# Patient Record
Sex: Female | Born: 1960 | Race: White | Hispanic: No | Marital: Married | Smoking: Never smoker
Health system: Southern US, Community
[De-identification: ages and names within clinical notes are randomized; demographics above are authoritative.]

## PROBLEM LIST (undated history)

## (undated) DIAGNOSIS — G4730 Sleep apnea, unspecified: Secondary | ICD-10-CM

## (undated) DIAGNOSIS — I10 Essential (primary) hypertension: Secondary | ICD-10-CM

## (undated) DIAGNOSIS — G43909 Migraine, unspecified, not intractable, without status migrainosus: Secondary | ICD-10-CM

## (undated) DIAGNOSIS — F419 Anxiety disorder, unspecified: Secondary | ICD-10-CM

## (undated) DIAGNOSIS — K219 Gastro-esophageal reflux disease without esophagitis: Secondary | ICD-10-CM

## (undated) DIAGNOSIS — C801 Malignant (primary) neoplasm, unspecified: Secondary | ICD-10-CM

## (undated) HISTORY — DX: Gastro-esophageal reflux disease without esophagitis: K21.9

## (undated) HISTORY — PX: TUBAL LIGATION: SHX77

## (undated) HISTORY — DX: Anxiety disorder, unspecified: F41.9

## (undated) HISTORY — PX: TONSILLECTOMY: SUR1361

## (undated) HISTORY — PX: OTHER SURGICAL HISTORY: SHX169

## (undated) HISTORY — DX: Sleep apnea, unspecified: G47.30

## (undated) HISTORY — DX: Essential (primary) hypertension: I10

---

## 2009-11-13 ENCOUNTER — Ambulatory Visit: Payer: Self-pay | Admitting: Family Medicine

## 2010-12-29 ENCOUNTER — Ambulatory Visit: Payer: Self-pay

## 2011-01-20 ENCOUNTER — Ambulatory Visit: Payer: Self-pay | Admitting: Family Medicine

## 2016-05-09 ENCOUNTER — Encounter: Payer: Self-pay | Admitting: Family Medicine

## 2016-05-09 ENCOUNTER — Ambulatory Visit (INDEPENDENT_AMBULATORY_CARE_PROVIDER_SITE_OTHER): Payer: BC Managed Care – PPO | Admitting: Family Medicine

## 2016-05-09 VITALS — BP 139/79 | HR 63 | Temp 98.1°F | Ht 65.0 in | Wt 208.0 lb

## 2016-05-09 DIAGNOSIS — B9789 Other viral agents as the cause of diseases classified elsewhere: Secondary | ICD-10-CM | POA: Diagnosis not present

## 2016-05-09 DIAGNOSIS — R232 Flushing: Secondary | ICD-10-CM | POA: Diagnosis not present

## 2016-05-09 DIAGNOSIS — J069 Acute upper respiratory infection, unspecified: Secondary | ICD-10-CM

## 2016-05-09 MED ORDER — ALBUTEROL SULFATE HFA 108 (90 BASE) MCG/ACT IN AERS: 2.0000 | INHALATION_SPRAY | Freq: Four times a day (QID) | RESPIRATORY_TRACT | 0 refills | Status: DC | PRN

## 2016-05-09 MED ORDER — BENZONATATE 100 MG PO CAPS: 200.0000 mg | ORAL_CAPSULE | Freq: Three times a day (TID) | ORAL | 0 refills | Status: DC | PRN

## 2016-05-09 MED ORDER — PAROXETINE MESYLATE 7.5 MG PO CAPS: 7.5000 mg | ORAL_CAPSULE | Freq: Every day | ORAL | 1 refills | Status: DC

## 2016-05-09 MED ORDER — HYDROCOD POLST-CPM POLST ER 10-8 MG/5ML PO SUER: 5.0000 mL | Freq: Two times a day (BID) | ORAL | 0 refills | Status: DC | PRN

## 2016-05-09 NOTE — Progress Notes (Signed)
BP 139/79 (BP Location: Right Arm, Patient Position: Sitting, Cuff Size: Large)   Pulse 63   Temp 98.1 F (36.7 C)   Ht 5\' 5"  (1.651 m) Comment: pt had shoes on  Wt 208 lb (94.3 kg) Comment: pt had shoes on  LMP 05/09/2014 (Approximate)   SpO2 97%   BMI 34.61 kg/m    Subjective:    Patient ID: Kendra Espinoza, female    DOB: [birth year], 55 y.o.   MRN: [id number]  HPI: Kendra Espinoza is a 55 y.o. female  Chief Complaint  Patient presents with  . URI    pt states she has had a fever, cough, achey, chest congestion, and left ear pain. States symptoms starter Saturday    Patient presents with 2 day history of cough, chest congestion and tightness, and left ear pain. Fever of 101 yesterday. Body aches, chills, sweats. Taking nyquil with some relief. Had a sick contact at work last week.   Also c/o hot flashes that have been significantly impacting her quality of life. Entire body gets flushed and she sweats profusely intermittently throughout the day. Keeps house freezing cold, husband and visitors constantly note how chilly she keeps it.   Relevant past medical, surgical, family and social history reviewed and updated as indicated. Interim medical history since our last visit reviewed. Allergies and medications reviewed and updated.  Review of Systems  Constitutional: Positive for chills, diaphoresis and fever.  HENT: Positive for congestion, ear pain and rhinorrhea.   Eyes: Negative.   Respiratory: Positive for cough and chest tightness.   Cardiovascular: Negative.   Gastrointestinal: Positive for diarrhea.  Genitourinary: Negative.   Musculoskeletal: Positive for myalgias.  Skin: Negative.   Neurological: Negative.   Psychiatric/Behavioral: Negative.     Per HPI unless specifically indicated above     Objective:    BP 139/79 (BP Location: Right Arm, Patient Position: Sitting, Cuff Size: Large)   Pulse 63   Temp 98.1 F (36.7 C)   Ht 5\' 5"  (1.651 m) Comment: pt had  shoes on  Wt 208 lb (94.3 kg) Comment: pt had shoes on  LMP 05/09/2014 (Approximate)   SpO2 97%   BMI 34.61 kg/m   Wt Readings from Last 3 Encounters:  05/09/16 208 lb (94.3 kg)    Physical Exam  Constitutional: She is oriented to person, place, and time. She appears well-developed and well-nourished. No distress.  HENT:  Head: Atraumatic.  Nose: Nose normal.  Mouth/Throat: Oropharynx is clear and moist. No oropharyngeal exudate.  B/l TMs with mild effusion. No erythema or purulent fluid  Eyes: Conjunctivae are normal. Pupils are equal, round, and reactive to light. No scleral icterus.  Neck: Normal range of motion. Neck supple.  Cardiovascular: Normal rate, regular rhythm and normal heart sounds.   Pulmonary/Chest: Effort normal and breath sounds normal. No respiratory distress. She has no wheezes.  Musculoskeletal: Normal range of motion. She exhibits no edema or tenderness.  Lymphadenopathy:    She has no cervical adenopathy.  Neurological: She is alert and oriented to person, place, and time.  Skin: Skin is warm and dry. No rash noted.  Psychiatric: She has a normal mood and affect. Her behavior is normal.  Nursing note and vitals reviewed.   No results found for this or any previous visit.    Assessment & Plan:   Problem List Items Addressed This Visit    None    Visit Diagnoses    Viral upper respiratory tract infection    -  Primary   Tussionex, tessalon perles, and albuterol sent for symptomatic treatment. Humidifier, mucinex, fluids. Follow up if no improvement later this week.    Hot flashes       Will do a trial of Brisdelle, side effects and precautions discussed. Patient understands and is agreeable to plan. Will follow up during physical        Follow up plan: Return if symptoms worsen or fail to improve, for Physical exam.

## 2016-05-11 ENCOUNTER — Other Ambulatory Visit: Payer: Self-pay | Admitting: Family Medicine

## 2016-05-11 ENCOUNTER — Encounter: Payer: Self-pay | Admitting: Family Medicine

## 2016-05-11 MED ORDER — AMOXICILLIN-POT CLAVULANATE 875-125 MG PO TABS: 1.0000 | ORAL_TABLET | Freq: Two times a day (BID) | ORAL | 0 refills | Status: DC

## 2016-05-27 ENCOUNTER — Other Ambulatory Visit: Payer: Self-pay | Admitting: Family Medicine

## 2016-05-27 ENCOUNTER — Encounter: Payer: Self-pay | Admitting: Family Medicine

## 2016-05-27 MED ORDER — AMOXICILLIN-POT CLAVULANATE 875-125 MG PO TABS: 1.0000 | ORAL_TABLET | Freq: Two times a day (BID) | ORAL | 0 refills | Status: DC

## 2016-07-22 ENCOUNTER — Encounter: Payer: BC Managed Care – PPO | Admitting: Family Medicine

## 2016-08-15 ENCOUNTER — Ambulatory Visit
Admission: EM | Admit: 2016-08-15 | Discharge: 2016-08-15 | Disposition: A | Discharge status: Home / Self Care | Payer: BC Managed Care – PPO | Attending: Family Medicine | Admitting: Family Medicine

## 2016-08-15 DIAGNOSIS — J069 Acute upper respiratory infection, unspecified: Secondary | ICD-10-CM

## 2016-08-15 MED ORDER — HYDROCOD POLST-CPM POLST ER 10-8 MG/5ML PO SUER: 5.0000 mL | Freq: Two times a day (BID) | ORAL | 0 refills | Status: DC

## 2016-08-15 MED ORDER — BENZONATATE 200 MG PO CAPS: 200.0000 mg | ORAL_CAPSULE | Freq: Three times a day (TID) | ORAL | 0 refills | Status: DC

## 2016-08-15 MED ORDER — FLUTICASONE PROPIONATE 50 MCG/ACT NA SUSP: 2.0000 | Freq: Every day | NASAL | 0 refills | Status: DC

## 2016-08-15 NOTE — ED Triage Notes (Addendum)
Pt c/o cough, body aches, stuffy nose, sore all over and sore throat. Symptoms started saturday

## 2016-08-15 NOTE — ED Provider Notes (Signed)
CSN: [id number]     Arrival date & time 08/15/16  [id number] History   First MD Initiated Contact with Patient 08/15/16 1235     Chief Complaint  Patient presents with  . Cough   (Consider location/radiation/quality/duration/timing/severity/associated sxs/prior Treatment) HPI  This 56 year old female who presents with cough body aches stuffy nose that started 2 days ago. He works as a Secretary/administrator at [employer]. Her cough is nonproductive. Is been planing of nasal congestion has been using a vaporizer at nighttime. He did not receive a flu shot  this year. No fever or chills.       History reviewed. No pertinent past medical history. Past Surgical History:  Procedure Laterality Date  . CESAREAN SECTION    . skin cancer removal    . TONSILLECTOMY     Family History  Problem Relation Age of Onset  . Heart disease Mother   . Hypertension Mother   . Stroke Mother   . Heart attack Father 71  . Heart disease Father   . Hypertension Father   . Drug abuse Sister   . Hodgkin's lymphoma Brother   . Heart attack Maternal Grandmother   . Heart attack Maternal Grandfather   . Heart attack Paternal Grandfather    Social History  Substance Use Topics  . Smoking status: Never Smoker  . Smokeless tobacco: Never Used  . Alcohol use No   OB History    No data available     Review of Systems  Constitutional: Positive for activity change and fatigue. Negative for chills and fever.  HENT: Positive for congestion, postnasal drip, rhinorrhea and sinus pressure.   Respiratory: Positive for cough. Negative for shortness of breath, wheezing and stridor.   All other systems reviewed and are negative.   Allergies  Patient has no known allergies.  Home Medications   Prior to Admission medications   Medication Sig Start Date End Date Taking? Authorizing Provider  albuterol (PROVENTIL HFA;VENTOLIN HFA) 108 (90 Base) MCG/ACT inhaler Inhale 2 puffs into the lungs every 6 (six) hours as needed  for wheezing or shortness of breath. 05/09/16  Yes Volney American, PA-C  benzonatate (TESSALON) 200 MG capsule Take 1 capsule (200 mg total) by mouth every 8 (eight) hours. 08/15/16   Lorin Picket, PA-C  chlorpheniramine-HYDROcodone (TUSSIONEX PENNKINETIC ER) 10-8 MG/5ML SUER Take 5 mLs by mouth 2 (two) times daily. 08/15/16   Lorin Picket, PA-C  fluticasone (FLONASE) 50 MCG/ACT nasal spray Place 2 sprays into both nostrils daily. 08/15/16   Lorin Picket, PA-C   Meds Ordered and Administered this Visit  Medications - No data to display  BP (!) 163/97 (BP Location: Left Arm)   Pulse 68   Temp 98.9 F (37.2 C) (Oral)   Resp 18   Ht 5\' 3"  (1.6 m)   Wt 185 lb (83.9 kg)   LMP 05/09/2014 (Approximate)   SpO2 99%   BMI 32.77 kg/m  No data found.   Physical Exam  Constitutional: She is oriented to person, place, and time. She appears well-developed and well-nourished. No distress.  HENT:  Head: Normocephalic and atraumatic.  Right Ear: External ear normal.  Left Ear: External ear normal.  Nose: Nose normal.  Mouth/Throat: Oropharynx is clear and moist. No oropharyngeal exudate.  Eyes: EOM are normal. Pupils are equal, round, and reactive to light. Right eye exhibits no discharge. Left eye exhibits no discharge.  Neck: Normal range of motion. Neck supple.  Pulmonary/Chest: Effort normal and breath  sounds normal. No respiratory distress. She has no wheezes. She has no rales.  Musculoskeletal: Normal range of motion.  Lymphadenopathy:    She has no cervical adenopathy.  Neurological: She is alert and oriented to person, place, and time.  Skin: Skin is warm and dry. She is not diaphoretic.  Psychiatric: She has a normal mood and affect. Her behavior is normal. Judgment and thought content normal.  Nursing note and vitals reviewed.   Urgent Care Course     Procedures (including critical care time)  Labs Review Labs Reviewed - No data to display  Imaging Review No  results found.   Visual Acuity Review  Right Eye Distance:   Left Eye Distance:   Bilateral Distance:    Right Eye Near:   Left Eye Near:    Bilateral Near:         MDM   1. Acute upper respiratory infection    Discharge Medication List as of 08/15/2016 12:46 PM    START taking these medications   Details  benzonatate (TESSALON) 200 MG capsule Take 1 capsule (200 mg total) by mouth every 8 (eight) hours., Starting Mon 08/15/2016, Normal    chlorpheniramine-HYDROcodone (TUSSIONEX PENNKINETIC ER) 10-8 MG/5ML SUER Take 5 mLs by mouth 2 (two) times daily., Starting Mon 08/15/2016, Print    fluticasone (FLONASE) 50 MCG/ACT nasal spray Place 2 sprays into both nostrils daily., Starting Mon 08/15/2016, Normal      Plan: 1. Test/x-ray results and diagnosis reviewed with patient 2. rx as per orders; risks, benefits, potential side effects reviewed with patient 3. Recommend supportive treatment with Rest and fluids.Use Flonase for nasal congestion for 3-4 weeks. If she is not improving should follow-up with her primary care physician 4. F/u prn if symptoms worsen or don't improve     Lorin Picket, PA-C 08/15/16 1251

## 2016-08-16 ENCOUNTER — Telehealth: Payer: Self-pay

## 2017-08-23 ENCOUNTER — Ambulatory Visit: Payer: BC Managed Care – PPO | Admitting: Family Medicine

## 2017-08-23 ENCOUNTER — Encounter: Payer: Self-pay | Admitting: Family Medicine

## 2017-08-23 VITALS — BP 117/82 | HR 77 | Temp 98.0°F | Wt 194.6 lb

## 2017-08-23 DIAGNOSIS — J0110 Acute frontal sinusitis, unspecified: Secondary | ICD-10-CM

## 2017-08-23 MED ORDER — AMOXICILLIN-POT CLAVULANATE 875-125 MG PO TABS: 1.0000 | ORAL_TABLET | Freq: Two times a day (BID) | ORAL | 0 refills | Status: DC

## 2017-08-23 MED ORDER — BENZONATATE 200 MG PO CAPS: 200.0000 mg | ORAL_CAPSULE | Freq: Three times a day (TID) | ORAL | 0 refills | Status: DC | PRN

## 2017-08-23 MED ORDER — HYDROCOD POLST-CPM POLST ER 10-8 MG/5ML PO SUER: 5.0000 mL | Freq: Two times a day (BID) | ORAL | 0 refills | Status: DC

## 2017-08-23 NOTE — Progress Notes (Signed)
   BP 117/82 (BP Location: Left Arm, Patient Position: Bed low/side rails up, Cuff Size: Normal)   Pulse 77   Temp 98 F (36.7 C) (Oral)   Wt 194 lb 9.6 oz (88.3 kg)   LMP 05/09/2014 (Approximate)   SpO2 99%   BMI 34.47 kg/m    Subjective:    Patient ID: Kendra Espinoza, female    DOB: December 17, 1960, 57 y.o.   MRN: [id number]  HPI: Kendra Espinoza is a 57 y.o. female  Chief Complaint  Patient presents with  . Cough    Patient has been taking OTC medications with no releif. Ongoing for 1 week.  . Shortness of Breath  . Nasal Congestion  . Epistaxis  . Nausea  . Diarrhea   Facial pain and pressure, fatigue, congestion, thick bloody drainage, body aches, low grade fevers, diarrhea x 1 week. Denies CP,SOB, vomiting. Taking nyquil, sudafed, cold and cough medications. Lots of sick contacts, works at [employer].   Relevant past medical, surgical, family and social history reviewed and updated as indicated. Interim medical history since our last visit reviewed. Allergies and medications reviewed and updated.  Review of Systems  Per HPI unless specifically indicated above     Objective:    BP 117/82 (BP Location: Left Arm, Patient Position: Bed low/side rails up, Cuff Size: Normal)   Pulse 77   Temp 98 F (36.7 C) (Oral)   Wt 194 lb 9.6 oz (88.3 kg)   LMP 05/09/2014 (Approximate)   SpO2 99%   BMI 34.47 kg/m   Wt Readings from Last 3 Encounters:  08/23/17 194 lb 9.6 oz (88.3 kg)  08/15/16 185 lb (83.9 kg)  05/09/16 208 lb (94.3 kg)    Physical Exam  Constitutional: She is oriented to person, place, and time. She appears well-developed and well-nourished. No distress.  HENT:  Head: Atraumatic.  Right Ear: External ear normal.  Left Ear: External ear normal.  Nasal mucosa and oropharynx erythematous and mildly edematous, drainage present B/l sinus ttp  Eyes: Conjunctivae are normal. Pupils are equal, round, and reactive to light. No scleral icterus.  Neck: Normal range  of motion. Neck supple.  Cardiovascular: Normal rate and normal heart sounds.  Pulmonary/Chest: Effort normal and breath sounds normal. No respiratory distress.  Musculoskeletal: Normal range of motion.  Neurological: She is alert and oriented to person, place, and time.  Skin: Skin is warm and dry.  Psychiatric: She has a normal mood and affect. Her behavior is normal.  Nursing note and vitals reviewed.  No results found for this or any previous visit.    Assessment & Plan:   Problem List Items Addressed This Visit    None    Visit Diagnoses    Acute frontal sinusitis, recurrence not specified    -  Primary   Will tx with augmentin, tessalon, and tussionex. Continue OTC mucinex, sinus rinses, etc. Sedation precautions reviewed, pt agreeable. F/u if no better   Relevant Medications   amoxicillin-clavulanate (AUGMENTIN) 875-125 MG tablet   chlorpheniramine-HYDROcodone (TUSSIONEX PENNKINETIC ER) 10-8 MG/5ML SUER   benzonatate (TESSALON) 200 MG capsule       Follow up plan: Return if symptoms worsen or fail to improve.

## 2017-08-23 NOTE — Patient Instructions (Signed)
Follow up as needed

## 2017-08-25 ENCOUNTER — Other Ambulatory Visit: Payer: Self-pay | Admitting: Family Medicine

## 2017-08-25 ENCOUNTER — Encounter: Payer: Self-pay | Admitting: Family Medicine

## 2017-09-13 ENCOUNTER — Encounter: Payer: Self-pay | Admitting: Family Medicine

## 2017-09-13 ENCOUNTER — Ambulatory Visit: Payer: BC Managed Care – PPO | Admitting: Family Medicine

## 2017-09-13 VITALS — BP 120/88 | HR 68 | Temp 98.6°F | Wt 195.6 lb

## 2017-09-13 DIAGNOSIS — G8929 Other chronic pain: Secondary | ICD-10-CM

## 2017-09-13 DIAGNOSIS — M79674 Pain in right toe(s): Secondary | ICD-10-CM | POA: Diagnosis not present

## 2017-09-13 DIAGNOSIS — D229 Melanocytic nevi, unspecified: Secondary | ICD-10-CM | POA: Diagnosis not present

## 2017-09-13 MED ORDER — GABAPENTIN 300 MG PO CAPS: 300.0000 mg | ORAL_CAPSULE | Freq: Three times a day (TID) | ORAL | 1 refills | Status: DC

## 2017-09-13 NOTE — Progress Notes (Signed)
BP 120/88 (BP Location: Left Arm, Patient Position: Sitting, Cuff Size: Normal)   Pulse 68   Temp 98.6 F (37 C) (Oral)   Wt 195 lb 9.6 oz (88.7 kg)   LMP 05/09/2014 (Approximate)   SpO2 99%   BMI 34.65 kg/m    Subjective:    Patient ID: Kendra Espinoza, female    DOB: Oct 06, 1960, 57 y.o.   MRN: [id number]  HPI: MICHAELLA Espinoza is a 57 y.o. female  Chief Complaint  Patient presents with  . Foot Pain    Broke toe back last May. Was working and stumbled and hurt foot. Foot has gave out and she has fell. Throbbing. Pain. Swelling. Right foot.   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 States she broke the toe last May and was out of work for several months. Has had persistent pain, redness, and swelling since that incident. Trying OTC pain relievers, soaks, elevation with minimal relief. Denies numbness, fevers, streaking.   Also has a raised mole on her chin that she would like removed for cosmetic reasons  No past medical history on file.  Social History   Socioeconomic History  . Marital status: Married    Spouse name: Not on file  . Number of children: Not on file  . Years of education: Not on file  . Highest education level: Not on file  Social Needs  . Financial resource strain: Not on file  . Food insecurity - worry: Not on file  . Food insecurity - inability: Not on file  . Transportation needs - medical: Not on file  . Transportation needs - non-medical: Not on file  Occupational History  . Not on file  Tobacco Use  . Smoking status: Never Smoker  . Smokeless tobacco: Never Used  Substance and Sexual Activity  . Alcohol use: No  . Drug use: No  . Sexual activity: Yes  Other Topics Concern  . Not on file  Social History Narrative  . Not on file   Relevant past medical, surgical, family and social history reviewed and updated as indicated. Interim medical  history since our last visit reviewed. Allergies and medications reviewed and updated.  Review of Systems  Per HPI unless specifically indicated above     Objective:    BP 120/88 (BP Location: Left Arm, Patient Position: Sitting, Cuff Size: Normal)   Pulse 68   Temp 98.6 F (37 C) (Oral)   Wt 195 lb 9.6 oz (88.7 kg)   LMP 05/09/2014 (Approximate)   SpO2 99%   BMI 34.65 kg/m   Wt Readings from Last 3 Encounters:  09/13/17 195 lb 9.6 oz (88.7 kg)  08/23/17 194 lb 9.6 oz (88.3 kg)  08/15/16 185 lb (83.9 kg)    Physical Exam  Constitutional: She is oriented to person, place, and time. She appears well-developed and well-nourished. No distress.  HENT:  Head: Atraumatic.  Eyes: Conjunctivae are normal. No scleral icterus.  Neck: Normal range of motion. Neck supple.  Cardiovascular: Normal rate and normal heart sounds.  Pulmonary/Chest: Effort normal. No respiratory distress.  Musculoskeletal:  ROM in 4th and 5th toes right foot minimal, which has been baseline since crush injury and fx last year 5th toe erythematous, mildly edematous, ttp  Neurological: She is alert and oriented to person, place, and time.  Skin: Skin is warm and dry.  flesh colored  raised nevus on chin  Psychiatric: She has a normal mood and affect. Her behavior is normal.  Nursing note and vitals reviewed.  No results found for this or any previous visit.    Assessment & Plan:   Problem List Items Addressed This Visit    None    Visit Diagnoses    Chronic toe pain, right foot    -  Primary   RICE protocol, start gabapentin, titrate up from 1-3 tabs daily for her chronic nerve pain following crush fracture. Will obtain additional x-ray if no relief   Nevus       Will refer to Dermatology for removal. Reassured pt that it's benign and removal would be for cosmesis   Relevant Medications   triamcinolone acetonide (KENALOG-40) injection 40 mg (Completed)   Other Relevant Orders   Ambulatory referral to  Dermatology       Follow up plan: Return for CPE after 4/24.

## 2017-09-14 MED ORDER — TRIAMCINOLONE ACETONIDE 40 MG/ML IJ SUSP
40.0000 mg | Freq: Once | INTRAMUSCULAR | Status: AC
  Administered 2017-09-13: 40 mg via INTRAMUSCULAR

## 2017-09-15 ENCOUNTER — Encounter: Payer: Self-pay | Admitting: Family Medicine

## 2017-09-16 NOTE — Patient Instructions (Signed)
Follow up for CPE 

## 2017-09-20 ENCOUNTER — Other Ambulatory Visit: Payer: Self-pay | Admitting: Family Medicine

## 2017-09-20 MED ORDER — PREDNISONE 20 MG PO TABS: 40.0000 mg | ORAL_TABLET | Freq: Every day | ORAL | 0 refills | Status: DC

## 2017-09-22 ENCOUNTER — Telehealth: Payer: Self-pay | Admitting: Family Medicine

## 2017-09-22 NOTE — Telephone Encounter (Signed)
Pt returned call. Please try her again on [phone].

## 2017-09-22 NOTE — Telephone Encounter (Signed)
Called pt yesterday around 4:50 pm to discuss FMLA form specifics, left message to return call. Please let me know if she calls back. I will try again later today

## 2017-09-22 NOTE — Telephone Encounter (Signed)
Spoke with patient regarding her forms, she is aware they will be ready for her to come pick up

## 2017-11-10 ENCOUNTER — Encounter: Payer: BC Managed Care – PPO | Admitting: Family Medicine

## 2018-01-15 ENCOUNTER — Ambulatory Visit: Payer: BC Managed Care – PPO | Admitting: Family Medicine

## 2018-01-15 ENCOUNTER — Encounter: Payer: Self-pay | Admitting: Family Medicine

## 2018-01-15 ENCOUNTER — Other Ambulatory Visit: Payer: Self-pay

## 2018-01-15 VITALS — BP 135/88 | HR 61 | Temp 98.2°F | Ht 63.0 in | Wt 196.3 lb

## 2018-01-15 DIAGNOSIS — S46912A Strain of unspecified muscle, fascia and tendon at shoulder and upper arm level, left arm, initial encounter: Secondary | ICD-10-CM | POA: Diagnosis not present

## 2018-01-15 MED ORDER — MELOXICAM 15 MG PO TABS: 15.0000 mg | ORAL_TABLET | Freq: Every day | ORAL | 0 refills | Status: DC

## 2018-01-15 NOTE — Progress Notes (Signed)
   BP 135/88 (BP Location: Right Arm, Patient Position: Sitting, Cuff Size: Normal)   Pulse 61   Temp 98.2 F (36.8 C) (Oral)   Ht 5\' 3"  (1.6 m)   Wt 196 lb 4.8 oz (89 kg)   LMP 05/09/2014 (Approximate)   SpO2 97%   BMI 34.77 kg/m    Subjective:    Patient ID: Kendra Espinoza, female    DOB: Aug 24, 1960, 57 y.o.   MRN: [id number]  HPI: Kendra Espinoza is a 57 y.o. female  Chief Complaint  Patient presents with  . Arm Pain    Upper Left Arm. Patient lifted something at work. Ongoing for 6 days.   6 day hx of left upper arm pain, seems to have started after lifting a very heavy trash bag at work the day before. Pain is dull and constant. No numbness, tingling, burning, radiation down arm. Significantly limited ROM in the shoulder currently. Has tried ice packs and rest with no relief. Has not tried OTC medications.   Relevant past medical, surgical, family and social history reviewed and updated as indicated. Interim medical history since our last visit reviewed. Allergies and medications reviewed and updated.  Review of Systems  Per HPI unless specifically indicated above     Objective:    BP 135/88 (BP Location: Right Arm, Patient Position: Sitting, Cuff Size: Normal)   Pulse 61   Temp 98.2 F (36.8 C) (Oral)   Ht 5\' 3"  (1.6 m)   Wt 196 lb 4.8 oz (89 kg)   LMP 05/09/2014 (Approximate)   SpO2 97%   BMI 34.77 kg/m   Wt Readings from Last 3 Encounters:  01/15/18 196 lb 4.8 oz (89 kg)  09/13/17 195 lb 9.6 oz (88.7 kg)  08/23/17 194 lb 9.6 oz (88.3 kg)    Physical Exam  Constitutional: She is oriented to person, place, and time. She appears well-developed and well-nourished. No distress.  HENT:  Head: Atraumatic.  Eyes: Pupils are equal, round, and reactive to light.  Neck: Normal range of motion. Neck supple.  Cardiovascular: Normal rate and regular rhythm.  Pulmonary/Chest: Effort normal and breath sounds normal.  Musculoskeletal: She exhibits tenderness (ttp left  anterior shoulder and deltoid). She exhibits no edema or deformity.  ROM exam limited by pt discomfort. Active ROM attempted to about 30 degrees Grip strength full and equal b/l  Neurological: She is alert and oriented to person, place, and time.  B/l UEs neurovascularly intact  Skin: Skin is warm and dry. No erythema.  Psychiatric: She has a normal mood and affect. Her behavior is normal.  Nursing note and vitals reviewed.  No results found for this or any previous visit.    Assessment & Plan:   Problem List Items Addressed This Visit    None    Visit Diagnoses    Shoulder strain, left, initial encounter    -  Primary   Tx with meloxicam, tylenol, epsom salt soaks, gentle stretches. Work note given and advised to avoid any heavy lifting for several days until improved       Follow up plan: Return for as scheduled.

## 2018-01-16 NOTE — Patient Instructions (Signed)
Follow up as scheduled.  

## 2018-03-27 ENCOUNTER — Encounter: Payer: Self-pay | Admitting: Family Medicine

## 2018-03-27 ENCOUNTER — Other Ambulatory Visit: Payer: Self-pay | Admitting: Family Medicine

## 2018-03-27 ENCOUNTER — Other Ambulatory Visit: Payer: Self-pay

## 2018-03-27 ENCOUNTER — Ambulatory Visit: Payer: BC Managed Care – PPO | Admitting: Family Medicine

## 2018-03-27 VITALS — BP 123/81 | HR 68 | Temp 97.8°F | Ht 63.0 in | Wt 198.5 lb

## 2018-03-27 DIAGNOSIS — F4321 Adjustment disorder with depressed mood: Secondary | ICD-10-CM

## 2018-03-27 DIAGNOSIS — Z23 Encounter for immunization: Secondary | ICD-10-CM

## 2018-03-27 DIAGNOSIS — R519 Headache, unspecified: Secondary | ICD-10-CM

## 2018-03-27 DIAGNOSIS — R51 Headache: Secondary | ICD-10-CM | POA: Diagnosis not present

## 2018-03-27 MED ORDER — KETOROLAC TROMETHAMINE 60 MG/2ML IM SOLN
60.0000 mg | Freq: Once | INTRAMUSCULAR | Status: AC
  Administered 2018-03-27: 60 mg via INTRAMUSCULAR

## 2018-03-27 MED ORDER — DULOXETINE HCL 20 MG PO CPEP: 20.0000 mg | ORAL_CAPSULE | Freq: Every day | ORAL | 3 refills | Status: DC

## 2018-03-27 NOTE — Progress Notes (Signed)
BP 123/81   Pulse 68   Temp 97.8 F (36.6 C) (Oral)   Ht 5\' 3"  (1.6 m)   Wt 198 lb 8 oz (90 kg)   SpO2 98%   BMI 35.16 kg/m    Subjective:    Patient ID: Kendra Espinoza, female    DOB: 08-28-1960, 57 y.o.   MRN: [id number]  HPI: Kendra Espinoza is a 56 y.o. female  Chief Complaint  Patient presents with  . Headache    x 5 days pt states she has been taking a Rx Topiramate, states that seems like it's no longer helping as she has been through a lot lately, dad passed away recently   Here today to discuss 5 days of nagging headaches that she feels are largely related to grief as she recently lost her father suddenly. Already taking topamax regularly as well as OTC pain relievers. Crying often, not eating or drinking well. Not seeing a grief counselor. Denies visual changes, N/V, photophobia, phonophobia. No PMH of depression.   Depression screen PHQ 2/9 03/27/2018  Decreased Interest 2  Down, Depressed, Hopeless 2  PHQ - 2 Score 4  Altered sleeping 3  Tired, decreased energy 3  Change in appetite 3  Feeling bad or failure about yourself  1  Trouble concentrating 2  Moving slowly or fidgety/restless 1  Suicidal thoughts 0  PHQ-9 Score 17    Relevant past medical, surgical, family and social history reviewed and updated as indicated. Interim medical history since our last visit reviewed. Allergies and medications reviewed and updated.  Review of Systems  Per HPI unless specifically indicated above     Objective:    BP 123/81   Pulse 68   Temp 97.8 F (36.6 C) (Oral)   Ht 5\' 3"  (1.6 m)   Wt 198 lb 8 oz (90 kg)   SpO2 98%   BMI 35.16 kg/m   Wt Readings from Last 3 Encounters:  03/27/18 198 lb 8 oz (90 kg)  01/15/18 196 lb 4.8 oz (89 kg)  09/13/17 195 lb 9.6 oz (88.7 kg)    Physical Exam  Constitutional: She is oriented to person, place, and time. She appears well-developed and well-nourished. No distress.  HENT:  Head: Atraumatic.  Eyes: Conjunctivae and EOM  are normal.  Neck: Normal range of motion. Neck supple.  Cardiovascular: Normal rate and regular rhythm.  Musculoskeletal: Normal range of motion.  Neurological: She is alert and oriented to person, place, and time.  Skin: Skin is warm and dry.  Psychiatric: She has a normal mood and affect. Her behavior is normal.  Nursing note and vitals reviewed.   No results found for this or any previous visit.    Assessment & Plan:   Problem List Items Addressed This Visit    None    Visit Diagnoses    Nonintractable episodic headache, unspecified headache type    -  Primary   IM toradol given today. Will start cymablta to help with her adjustment depression as well as her headaches. Push fluids, stretch, rest    Relevant Medications   DULoxetine (CYMBALTA) 20 MG capsule   ketorolac (TORADOL) injection 60 mg (Completed)   Grief       Packet of counselors given, urged pt to follow up on grief counseling. Will start cymablta for adjustment depression   Flu vaccine need       Relevant Orders   Flu Vaccine QUAD 36+ mos IM (Completed)  Follow up plan: Return in about 4 weeks (around 04/24/2018) for Mood, headache f/u.

## 2018-03-28 ENCOUNTER — Other Ambulatory Visit: Payer: Self-pay | Admitting: Family Medicine

## 2018-03-28 ENCOUNTER — Encounter: Payer: Self-pay | Admitting: Family Medicine

## 2018-03-28 NOTE — Patient Instructions (Signed)
Follow up in 1 month   

## 2018-04-20 ENCOUNTER — Encounter: Payer: Self-pay | Admitting: Family Medicine

## 2018-04-20 ENCOUNTER — Other Ambulatory Visit: Payer: Self-pay

## 2018-04-20 ENCOUNTER — Ambulatory Visit: Payer: BC Managed Care – PPO | Admitting: Family Medicine

## 2018-04-20 VITALS — BP 143/89 | HR 80 | Temp 98.5°F | Ht 63.0 in | Wt 198.0 lb

## 2018-04-20 DIAGNOSIS — B9789 Other viral agents as the cause of diseases classified elsewhere: Secondary | ICD-10-CM

## 2018-04-20 DIAGNOSIS — J069 Acute upper respiratory infection, unspecified: Secondary | ICD-10-CM | POA: Diagnosis not present

## 2018-04-20 LAB — VERITOR FLU A/B WAIVED
Influenza A: NEGATIVE
Influenza B: NEGATIVE

## 2018-04-20 MED ORDER — BENZONATATE 200 MG PO CAPS: 200.0000 mg | ORAL_CAPSULE | Freq: Three times a day (TID) | ORAL | 0 refills | Status: DC | PRN

## 2018-04-20 MED ORDER — HYDROCOD POLST-CPM POLST ER 10-8 MG/5ML PO SUER: 5.0000 mL | Freq: Two times a day (BID) | ORAL | 0 refills | Status: DC | PRN

## 2018-04-20 NOTE — Progress Notes (Signed)
   BP (!) 143/89   Pulse 80   Temp 98.5 F (36.9 C) (Oral)   Ht 5\' 3"  (1.6 m)   Wt 198 lb (89.8 kg)   SpO2 98%   BMI 35.07 kg/m    Subjective:    Patient ID: Kendra Espinoza, female    DOB: May 30, 1961, 57 y.o.   MRN: [id number]  HPI: Kendra Espinoza is a 57 y.o. female  Chief Complaint  Patient presents with  . Cough    pt states she has had fever two days ago/ states she feels very warm and heartburn   3 days of fatigue, fevers, aches, hot flashes, sweats, cough, sore throat, congestion. Fever broke after first day but still feeling very run down. Cough is most bothersome right now. No CP, SOB, HAs, N/V/D. Not trying anything OTC currently. No sick contacts, smoking hx.   Relevant past medical, surgical, family and social history reviewed and updated as indicated. Interim medical history since our last visit reviewed. Allergies and medications reviewed and updated.  Review of Systems  Per HPI unless specifically indicated above     Objective:    BP (!) 143/89   Pulse 80   Temp 98.5 F (36.9 C) (Oral)   Ht 5\' 3"  (1.6 m)   Wt 198 lb (89.8 kg)   SpO2 98%   BMI 35.07 kg/m   Wt Readings from Last 3 Encounters:  04/20/18 198 lb (89.8 kg)  03/27/18 198 lb 8 oz (90 kg)  01/15/18 196 lb 4.8 oz (89 kg)    Physical Exam  Constitutional: She is oriented to person, place, and time. She appears well-developed and well-nourished. No distress.  HENT:  Head: Atraumatic.  Right Ear: External ear normal.  Left Ear: External ear normal.  Mouth/Throat: No oropharyngeal exudate.  Oropharynx and nasal mucosa erythematous and edematous  Eyes: Conjunctivae and EOM are normal.  Neck: Normal range of motion. Neck supple.  Cardiovascular: Normal rate and regular rhythm.  Pulmonary/Chest: Effort normal and breath sounds normal. She has no wheezes. She has no rales.  Musculoskeletal: Normal range of motion.  Neurological: She is alert and oriented to person, place, and time.  Skin: Skin  is warm and dry.  Psychiatric: She has a normal mood and affect. Her behavior is normal.  Nursing note and vitals reviewed.   No results found for this or any previous visit.    Assessment & Plan:   Problem List Items Addressed This Visit    None    Visit Diagnoses    Viral URI with cough    -  Primary   Rapid flu neg, tx symptomatically with tessalon, tussionex, mucinex, flonase, rest, fluids. F/u if not improving. Sedation precautions reviewed   Relevant Orders   Veritor Flu A/B Waived       Follow up plan: Return for as scheduled.

## 2018-04-20 NOTE — Patient Instructions (Signed)
Follow up as scheduled.  

## 2018-04-27 ENCOUNTER — Ambulatory Visit: Payer: BC Managed Care – PPO | Admitting: Family Medicine

## 2018-05-10 ENCOUNTER — Encounter: Payer: Self-pay | Admitting: Family Medicine

## 2018-05-11 MED ORDER — TOPIRAMATE 50 MG PO TABS: 50.0000 mg | ORAL_TABLET | Freq: Every day | ORAL | 3 refills | Status: DC

## 2018-06-08 ENCOUNTER — Ambulatory Visit
Admission: EM | Admit: 2018-06-08 | Discharge: 2018-06-08 | Disposition: A | Discharge status: Home / Self Care | Payer: BC Managed Care – PPO | Attending: Family Medicine | Admitting: Family Medicine

## 2018-06-08 ENCOUNTER — Other Ambulatory Visit: Payer: Self-pay

## 2018-06-08 DIAGNOSIS — R0981 Nasal congestion: Secondary | ICD-10-CM

## 2018-06-08 DIAGNOSIS — J111 Influenza due to unidentified influenza virus with other respiratory manifestations: Secondary | ICD-10-CM

## 2018-06-08 DIAGNOSIS — R69 Illness, unspecified: Secondary | ICD-10-CM | POA: Diagnosis not present

## 2018-06-08 LAB — RAPID STREP SCREEN (MED CTR MEBANE ONLY): Streptococcus, Group A Screen (Direct): NEGATIVE

## 2018-06-08 MED ORDER — OSELTAMIVIR PHOSPHATE 75 MG PO CAPS: 75.0000 mg | ORAL_CAPSULE | Freq: Two times a day (BID) | ORAL | 0 refills | Status: DC

## 2018-06-08 NOTE — ED Provider Notes (Signed)
[facility] ____________________________________________  Time seen: Approximately 9:45 AM  I have reviewed the triage vital signs and the nursing notes.   HISTORY  Chief Complaint Generalized Body Aches   HPI Kendra Espinoza is a 57 y.o. female presenting for evaluation of runny nose, nasal congestion, sneezing, cough, chills and body aches with quick onset yesterday evening.  Reports did have accompanying fever with T-max 101.  Did take NyQuil in the middle the night and had Tylenol in it.  No other of the counter medications taken prior to arrival.  States having generalized diffuse chills and body aches.  Some sore throat.  Expressed positive contact with multiple sick exposures including her son with cold-like symptoms, her son's child and girlfriend with strep.  Also reports works at [employer] with multiple sick contacts.  Continues to overall drink fluids well, decreased appetite.  Denies other aggravating alleviating factors.  No chest pain or shortness of breath.  Denies recent sickness.  Volney American, PA-C: PCP   History reviewed. No pertinent past medical history.  There are no active problems to display for this patient.   Past Surgical History:  Procedure Laterality Date  . CESAREAN SECTION    . skin cancer removal    . TONSILLECTOMY       No current facility-administered medications for this encounter.   Current Outpatient Medications:  .  topiramate (TOPAMAX) 50 MG tablet, Take 1 tablet (50 mg total) by mouth daily., Disp: 30 tablet, Rfl: 3 .  oseltamivir (TAMIFLU) 75 MG capsule, Take 1 capsule (75 mg total) by mouth every 12 (twelve) hours., Disp: 10 capsule, Rfl: 0  Allergies Patient has no known allergies.  Family History  Problem Relation Age of Onset  . Heart disease Mother   . Hypertension Mother   . Stroke Mother   . Heart attack Father 62  . Heart disease Father   . Hypertension Father   . Drug abuse Sister   .  Hodgkin's lymphoma Brother   . Heart attack Maternal Grandmother   . Heart attack Maternal Grandfather   . Heart attack Paternal Grandfather     Social History Social History   Tobacco Use  . Smoking status: Never Smoker  . Smokeless tobacco: Never Used  Substance Use Topics  . Alcohol use: Yes    Comment: occasionally  . Drug use: No    Review of Systems Constitutional: positive fever. ENT:as above.  Cardiovascular: Denies chest pain. Respiratory: Denies shortness of breath. Gastrointestinal: No abdominal pain.  Musculoskeletal: Negative for back pain. Skin: Negative for rash.   ____________________________________________   PHYSICAL EXAM:  VITAL SIGNS: ED Triage Vitals [06/08/18 0835]  Enc Vitals Group     BP (!) 163/93     Pulse Rate 71     Resp 18     Temp 98.3 F (36.8 C)     Temp Source Oral     SpO2 100 %     Weight 185 lb (83.9 kg)     Height 5\' 3"  (1.6 m)     Head Circumference      Peak Flow      Pain Score 8     Pain Loc      Pain Edu?      Excl. in [place]?     Constitutional: Alert and oriented. Well appearing and in no acute distress. Eyes: Conjunctivae are normal. Head: Atraumatic. No sinus tenderness to palpation. No swelling. No erythema.  Ears: no erythema, normal TMs  bilaterally.   Nose:Nasal congestion with clear rhinorrhea  Mouth/Throat: Mucous membranes are moist. Mild pharyngeal erythema. No tonsillar swelling or exudate.  Neck: No stridor.  No cervical spine tenderness to palpation. Hematological/Lymphatic/Immunilogical: No cervical lymphadenopathy. Cardiovascular: Normal rate, regular rhythm. Grossly normal heart sounds.  Good peripheral circulation. Respiratory: Normal respiratory effort.  No retractions. No wheezes, rales or rhonchi. Good air movement.  Musculoskeletal: Ambulatory with steady gait.  Neurologic:  Normal speech and language. No gait instability. Skin:  Skin appears warm, dry and intact. No rash noted. Psychiatric:  Mood and affect are normal. Speech and behavior are normal.  ___________________________________________   LABS (all labs ordered are listed, but only abnormal results are displayed)  Labs Reviewed  RAPID STREP SCREEN ([facility] ONLY)  CULTURE, GROUP A STREP [facility])   ____________________________________________   PROCEDURES Procedures   INITIAL IMPRESSION / ASSESSMENT AND PLAN / ED COURSE  Pertinent labs & imaging results that were available during my care of the patient were reviewed by me and considered in my medical decision making (see chart for details).  Well appearing patient.  No acute distress.  Quick strep negative, will culture.  Suspect influenza-like illness.  Discussed swabbing, deferred.  Discussed treatment.  Will treat with Tamiflu.  Encourage rest, fluids, supportive care.  Work note given.Discussed indication, risks and benefits of medications with patient.  Discussed follow up with Primary care physician this week. Discussed follow up and return parameters including no resolution or any worsening concerns. Patient verbalized understanding and agreed to plan.   ____________________________________________   FINAL CLINICAL IMPRESSION(S) / ED DIAGNOSES  Final diagnoses:  Influenza-like illness     ED Discharge Orders         Ordered    oseltamivir (TAMIFLU) 75 MG capsule  Every 12 hours     06/08/18 0913           Note: This dictation was prepared with Dragon dictation along with smaller phrase technology. Any transcriptional errors that result from this process are unintentional.         Marylene Land, NP 06/08/18 1027

## 2018-06-08 NOTE — ED Triage Notes (Signed)
Patient complains of cough, body aches and chills, headaches, chills x yesterday afternoon. Patient states that she has had a positive contact with strep.

## 2018-06-08 NOTE — Discharge Instructions (Signed)
Take medication as prescribed. Rest. Drink plenty of fluids.  Over-the-counter ibuprofen and Tylenol as needed.  Follow up with your primary care physician this week as needed. Return to Urgent care for new or worsening concerns.

## 2018-06-10 LAB — CULTURE, GROUP A STREP (THRC)

## 2018-06-12 ENCOUNTER — Ambulatory Visit: Payer: BC Managed Care – PPO | Admitting: Family Medicine

## 2018-06-19 ENCOUNTER — Ambulatory Visit: Payer: BC Managed Care – PPO | Admitting: Family Medicine

## 2018-06-21 ENCOUNTER — Encounter: Payer: Self-pay | Admitting: Family Medicine

## 2018-06-21 ENCOUNTER — Other Ambulatory Visit: Payer: Self-pay

## 2018-06-21 ENCOUNTER — Ambulatory Visit: Payer: BC Managed Care – PPO | Admitting: Family Medicine

## 2018-06-21 VITALS — BP 135/84 | HR 73 | Temp 97.9°F | Ht 63.0 in | Wt 199.0 lb

## 2018-06-21 DIAGNOSIS — F4321 Adjustment disorder with depressed mood: Secondary | ICD-10-CM | POA: Diagnosis not present

## 2018-06-21 DIAGNOSIS — F5102 Adjustment insomnia: Secondary | ICD-10-CM | POA: Diagnosis not present

## 2018-06-21 MED ORDER — QUETIAPINE FUMARATE 50 MG PO TABS: ORAL_TABLET | ORAL | 0 refills | Status: DC

## 2018-06-21 NOTE — Progress Notes (Signed)
BP 135/84   Pulse 73   Temp 97.9 F (36.6 C) (Oral)   Ht 5\' 3"  (1.6 m)   Wt 199 lb (90.3 kg)   BMI 35.25 kg/m    Subjective:    Patient ID: Kendra Espinoza, female    DOB: [birth year], 59 y.o.   MRN: [id number]  HPI: Kendra Espinoza is a 57 y.o. female  Chief Complaint  Patient presents with  . Insomnia    pt states has had sleeping problems x about a moth now/ states has tried OTC pills/ not helping   About a month of sleeping issues, getting about 3 hours nightly instead of her normal 6 + hours. Trying multiple OTC medications with no relief. Tried a glass of wine, hot shower, etc. Thinking it may be grief related from the loss of her father recently and the holidays. Feels very tired, just can't sleep. Does work third shift so always has some amount if issue sleeping but never like this. Declined mood medications previously for her grief reaction. Has not tried counseling.   Depression screen PHQ 2/9 03/27/2018  Decreased Interest 2  Down, Depressed, Hopeless 2  PHQ - 2 Score 4  Altered sleeping 3  Tired, decreased energy 3  Change in appetite 3  Feeling bad or failure about yourself  1  Trouble concentrating 2  Moving slowly or fidgety/restless 1  Suicidal thoughts 0  PHQ-9 Score 17   GAD 7 : Generalized Anxiety Score 03/27/2018  Nervous, Anxious, on Edge 2  Control/stop worrying 2  Worry too much - different things 2  Trouble relaxing 2  Restless 1  Easily annoyed or irritable 1  Afraid - awful might happen 0  Total GAD 7 Score 10   Relevant past medical, surgical, family and social history reviewed and updated as indicated. Interim medical history since our last visit reviewed. Allergies and medications reviewed and updated.  Review of Systems  Per HPI unless specifically indicated above     Objective:    BP 135/84   Pulse 73   Temp 97.9 F (36.6 C) (Oral)   Ht 5\' 3"  (1.6 m)   Wt 199 lb (90.3 kg)   BMI 35.25 kg/m   Wt Readings from Last 3 Encounters:    06/21/18 199 lb (90.3 kg)  06/08/18 185 lb (83.9 kg)  04/20/18 198 lb (89.8 kg)    Physical Exam  Constitutional: She is oriented to person, place, and time. She appears well-developed and well-nourished. No distress.  HENT:  Head: Atraumatic.  Eyes: Conjunctivae and EOM are normal.  Neck: Neck supple.  Cardiovascular: Normal rate, regular rhythm and normal heart sounds.  Pulmonary/Chest: Effort normal and breath sounds normal.  Musculoskeletal: Normal range of motion.  Neurological: She is alert and oriented to person, place, and time.  Skin: Skin is warm and dry.  Psychiatric: She has a normal mood and affect. Her behavior is normal.  Tearful, appears exhausted  Nursing note and vitals reviewed.   Results for orders placed or performed during the hospital encounter of 06/08/18  Rapid Strep Screen ([facility] ONLY)  Result Value Ref Range   Streptococcus, Group A Screen (Direct) NEGATIVE NEGATIVE  Culture, group A strep  Result Value Ref Range   Specimen Description      THROAT Performed at [facility], [address], [place], [place] [address]    Special Requests      NONE Reflexed from [id number] Performed at [facility]  [facility], [address] [place], [place] [address]    Culture      NO GROUP A STREP (S.PYOGENES) ISOLATED Performed at [facility], [place] [address], [place], [place] [address]    Report Status 06/10/2018 FINAL       Assessment & Plan:   Problem List Items Addressed This Visit      Other   Insomnia - Primary    Will start seroquel in addition to sleep hygiene tactics. Counseling recommended as suspect most of issue is coming from grief reaction       Other Visit Diagnoses    Grief       Strongly recommended grief counseling, pt will consider. Seroquel to help with sleep and moods. F/u in 1 month       Follow up plan: Return in about 4 weeks (around 07/19/2018) for Insomnia.

## 2018-06-22 ENCOUNTER — Encounter: Payer: Self-pay | Admitting: Family Medicine

## 2018-06-24 DIAGNOSIS — G4700 Insomnia, unspecified: Secondary | ICD-10-CM | POA: Insufficient documentation

## 2018-06-24 NOTE — Assessment & Plan Note (Signed)
Will start seroquel in addition to sleep hygiene tactics. Counseling recommended as suspect most of issue is coming from grief reaction

## 2018-07-06 ENCOUNTER — Ambulatory Visit
Admission: EM | Admit: 2018-07-06 | Discharge: 2018-07-06 | Disposition: A | Discharge status: Home / Self Care | Payer: BC Managed Care – PPO | Attending: Family Medicine | Admitting: Family Medicine

## 2018-07-06 ENCOUNTER — Other Ambulatory Visit: Payer: Self-pay

## 2018-07-06 DIAGNOSIS — A084 Viral intestinal infection, unspecified: Secondary | ICD-10-CM | POA: Diagnosis not present

## 2018-07-06 MED ORDER — ONDANSETRON 4 MG PO TBDP: 4.0000 mg | ORAL_TABLET | Freq: Three times a day (TID) | ORAL | 0 refills | Status: DC | PRN

## 2018-07-06 MED ORDER — PANTOPRAZOLE SODIUM 40 MG PO TBEC: 40.0000 mg | DELAYED_RELEASE_TABLET | Freq: Every day | ORAL | 0 refills | Status: DC

## 2018-07-06 NOTE — ED Provider Notes (Signed)
[facility]    CSN: [id number] Arrival date & time: 07/06/18  1017  History   Chief Complaint Chief Complaint  Patient presents with  . Emesis    HPI   58 year old female presents with nausea, vomiting or diarrhea.   Started Tues night/Wed am.  Patient reports nausea, vomiting, diarrhea.  She has no vomiting since midnight.  Still feels nauseous.  Still having diarrhea.  She has taken Pepto-Bismol without resolution.  No fever.  She does report some mild epigastric abdominal pain.  Associated belching.  No known exacerbating factors.  No other associated symptoms.  No other complaints.  PMH, Surgical Hx, Family Hx, Social History reviewed and updated as below.  PMH; Patient Active Problem List   Diagnosis Date Noted  . Insomnia 06/24/2018   Past Surgical History:  Procedure Laterality Date  . CESAREAN SECTION    . skin cancer removal    . TONSILLECTOMY     OB History   No obstetric history on file.    Home Medications    Prior to Admission medications   Medication Sig Start Date End Date Taking? Authorizing Provider  QUEtiapine (SEROQUEL) 50 MG tablet Take 1/2 - 1 tablet nightly for sleep 06/21/18  Yes Volney American, PA-C  topiramate (TOPAMAX) 50 MG tablet Take 1 tablet (50 mg total) by mouth daily. 05/11/18  Yes Volney American, PA-C  ondansetron (ZOFRAN-ODT) 4 MG disintegrating tablet Take 1 tablet (4 mg total) by mouth every 8 (eight) hours as needed for nausea or vomiting. 07/06/18   Coral Spikes, DO  pantoprazole (PROTONIX) 40 MG tablet Take 1 tablet (40 mg total) by mouth daily. 07/06/18   Coral Spikes, DO   Family History Family History  Problem Relation Age of Onset  . Heart disease Mother   . Hypertension Mother   . Stroke Mother   . Heart attack Father 24  . Heart disease Father   . Hypertension Father   . Drug abuse Sister   . Hodgkin's lymphoma Brother   . Heart attack Maternal Grandmother   . Heart attack Maternal  Grandfather   . Heart attack Paternal Grandfather    Social History Social History   Tobacco Use  . Smoking status: Never Smoker  . Smokeless tobacco: Never Used  Substance Use Topics  . Alcohol use: Yes    Comment: occasionally  . Drug use: No   Allergies   Patient has no known allergies.   Review of Systems Review of Systems  Constitutional: Negative for fever.  Gastrointestinal: Positive for abdominal pain, diarrhea, nausea and vomiting.   Physical Exam Triage Vital Signs ED Triage Vitals  Enc Vitals Group     BP 07/06/18 1033 (!) 154/92     Pulse Rate 07/06/18 1033 66     Resp 07/06/18 1033 18     Temp 07/06/18 1033 97.8 F (36.6 C)     Temp Source 07/06/18 1033 Oral     SpO2 07/06/18 1033 100 %     Weight 07/06/18 1032 199 lb (90.3 kg)     Height 07/06/18 1032 5\' 3"  (1.6 m)     Head Circumference --      Peak Flow --      Pain Score 07/06/18 1031 7     Pain Loc --      Pain Edu? --      Excl. in [place]? --    Updated Vital Signs BP (!) 154/92 (BP Location: Left Arm)  Pulse 66   Temp 97.8 F (36.6 C) (Oral)   Resp 18   Ht 5\' 3"  (1.6 m)   Wt 90.3 kg   SpO2 100%   BMI 35.25 kg/m   Visual Acuity Right Eye Distance:   Left Eye Distance:   Bilateral Distance:    Right Eye Near:   Left Eye Near:    Bilateral Near:     Physical Exam Vitals signs and nursing note reviewed.  Constitutional:      General: She is not in acute distress.    Appearance: Normal appearance.  HENT:     Head: Normocephalic and atraumatic.     Mouth/Throat:     Mouth: Mucous membranes are moist.     Pharynx: Oropharynx is clear.  Cardiovascular:     Rate and Rhythm: Normal rate and regular rhythm.  Pulmonary:     Effort: Pulmonary effort is normal.     Breath sounds: No wheezing, rhonchi or rales.  Abdominal:     General: Abdomen is flat. There is no distension.     Palpations: Abdomen is soft.     Comments: Mildly tender to palpation in the epigastric region.    Neurological:     Mental Status: She is alert.  Psychiatric:        Mood and Affect: Mood normal.        Behavior: Behavior normal.    UC Treatments / Results  Labs (all labs ordered are listed, but only abnormal results are displayed) Labs Reviewed - No data to display  EKG None  Radiology No results found.  Procedures Procedures (including critical care time)  Medications Ordered in UC Medications - No data to display  Initial Impression / Assessment and Plan / UC Course  I have reviewed the triage vital signs and the nursing notes.  Pertinent labs & imaging results that were available during my care of the patient were reviewed by me and considered in my medical decision making (see chart for details).    58 year old female presents with viral gastroenteritis.  Treating with Zofran.  Protonix for belching and epigastric discomfort.  Push fluids.  Supportive care.  Final Clinical Impressions(s) / UC Diagnoses   Final diagnoses:  Viral gastroenteritis   Discharge Instructions   None    ED Prescriptions    Medication Sig Dispense Auth. Provider   ondansetron (ZOFRAN-ODT) 4 MG disintegrating tablet Take 1 tablet (4 mg total) by mouth every 8 (eight) hours as needed for nausea or vomiting. 20 tablet Drey Shaff G, DO   pantoprazole (PROTONIX) 40 MG tablet Take 1 tablet (40 mg total) by mouth daily. 14 tablet Coral Spikes, DO     Controlled Substance Prescriptions [place] Controlled Substance Registry consulted? Not Applicable   Coral Spikes, DO 07/06/18 1510

## 2018-07-06 NOTE — ED Triage Notes (Signed)
Patient complains of nausea, vomiting and diarrhea that started 2 days ago. Patient states that symptoms have been constant and unable to get relief from [place].

## 2018-07-27 ENCOUNTER — Ambulatory Visit: Payer: BC Managed Care – PPO | Admitting: Family Medicine

## 2018-07-31 ENCOUNTER — Ambulatory Visit (INDEPENDENT_AMBULATORY_CARE_PROVIDER_SITE_OTHER): Payer: BC Managed Care – PPO | Admitting: Family Medicine

## 2018-07-31 ENCOUNTER — Encounter: Payer: Self-pay | Admitting: Family Medicine

## 2018-07-31 ENCOUNTER — Telehealth: Payer: Self-pay | Admitting: Family Medicine

## 2018-07-31 VITALS — BP 133/80 | HR 69 | Temp 98.2°F | Ht 63.0 in | Wt 201.1 lb

## 2018-07-31 DIAGNOSIS — F5102 Adjustment insomnia: Secondary | ICD-10-CM

## 2018-07-31 DIAGNOSIS — G43909 Migraine, unspecified, not intractable, without status migrainosus: Secondary | ICD-10-CM | POA: Insufficient documentation

## 2018-07-31 DIAGNOSIS — M79672 Pain in left foot: Secondary | ICD-10-CM | POA: Diagnosis not present

## 2018-07-31 MED ORDER — SUMATRIPTAN SUCCINATE 50 MG PO TABS: ORAL_TABLET | ORAL | 6 refills | Status: DC

## 2018-07-31 MED ORDER — QUETIAPINE FUMARATE 50 MG PO TABS: ORAL_TABLET | ORAL | 1 refills | Status: DC

## 2018-07-31 NOTE — Telephone Encounter (Signed)
Changed and sent

## 2018-07-31 NOTE — Telephone Encounter (Signed)
Copied from [place] [phone]. Topic: Quick Communication - Rx Refill/Question >> Jul 31, 2018 10:48 AM Margot Ables wrote: Medication: Please update directions for sumatriptan and resend orders to [facility]. They need to know beginning directions for use.  Preferred Pharmacy (with phone number or street name):   [facility] [address] [place] [phone] (Phone) [phone] (Fax)

## 2018-07-31 NOTE — Progress Notes (Signed)
BP 133/80 (BP Location: Left Arm, Patient Position: Sitting, Cuff Size: Normal)   Pulse 69   Temp 98.2 F (36.8 C) (Oral)   Ht 5\' 3"  (1.6 m)   Wt 201 lb 1.6 oz (91.2 kg)   SpO2 98%   BMI 35.62 kg/m    Subjective:    Patient ID: Kendra Espinoza, female    DOB: 02-07-1961, 58 y.o.   MRN: [id number]  HPI: Kendra Espinoza is a 58 y.o. female  Chief Complaint  Patient presents with  . Insomnia    4 week F/U  . Foot Pain    Left foot, cut in foot or cracked skin patient states. Patient can barely put any pressure on foot.    Here today for sleep and migraine f/u. Doing very well on the 25 mg seroquel, can't tolerate full tab due to daytime sedation. Feeling much better rested. Maybe some benefit with moods but still having some issues with family stressors.   Topamax helping quite a bit with migraines but still having some less severe migraines. Does not take anything for them otherwise.   Left foot cracking and pain, no injury. Notes she walks barefoot often outside. Denies drainage, redness, fevers, chills.  No past medical history on file.  Social History   Socioeconomic History  . Marital status: Married    Spouse name: Not on file  . Number of children: Not on file  . Years of education: Not on file  . Highest education level: Not on file  Occupational History  . Not on file  Social Needs  . Financial resource strain: Not on file  . Food insecurity:    Worry: Not on file    Inability: Not on file  . Transportation needs:    Medical: Not on file    Non-medical: Not on file  Tobacco Use  . Smoking status: Never Smoker  . Smokeless tobacco: Never Used  Substance and Sexual Activity  . Alcohol use: Yes    Comment: occasionally  . Drug use: No  . Sexual activity: Yes  Lifestyle  . Physical activity:    Days per week: Not on file    Minutes per session: Not on file  . Stress: Not on file  Relationships  . Social connections:    Talks on phone: Not on file   Gets together: Not on file    Attends religious service: Not on file    Active member of club or organization: Not on file    Attends meetings of clubs or organizations: Not on file    Relationship status: Not on file  . Intimate partner violence:    Fear of current or ex partner: Not on file    Emotionally abused: Not on file    Physically abused: Not on file    Forced sexual activity: Not on file  Other Topics Concern  . Not on file  Social History Narrative  . Not on file   Depression screen [facility] 2/9 07/31/2018 03/27/2018  Decreased Interest 1 2  Down, Depressed, Hopeless 1 2  PHQ - 2 Score 2 4  Altered sleeping 1 3  Tired, decreased energy 1 3  Change in appetite 1 3  Feeling bad or failure about yourself  0 1  Trouble concentrating 0 2  Moving slowly or fidgety/restless 0 1  Suicidal thoughts 0 0  PHQ-9 Score 5 17    Relevant past medical, surgical, family and social history reviewed and updated as  indicated. Interim medical history since our last visit reviewed. Allergies and medications reviewed and updated.  Review of Systems  Per HPI unless specifically indicated above     Objective:    BP 133/80 (BP Location: Left Arm, Patient Position: Sitting, Cuff Size: Normal)   Pulse 69   Temp 98.2 F (36.8 C) (Oral)   Ht 5\' 3"  (1.6 m)   Wt 201 lb 1.6 oz (91.2 kg)   SpO2 98%   BMI 35.62 kg/m   Wt Readings from Last 3 Encounters:  07/31/18 201 lb 1.6 oz (91.2 kg)  07/06/18 199 lb (90.3 kg)  06/21/18 199 lb (90.3 kg)    Physical Exam Vitals signs and nursing note reviewed.  Constitutional:      Appearance: Normal appearance. She is not ill-appearing.  HENT:     Head: Atraumatic.  Eyes:     Extraocular Movements: Extraocular movements intact.     Conjunctiva/sclera: Conjunctivae normal.  Neck:     Musculoskeletal: Normal range of motion and neck supple.  Cardiovascular:     Rate and Rhythm: Normal rate and regular rhythm.     Heart sounds: Normal heart  sounds.  Pulmonary:     Effort: Pulmonary effort is normal.     Breath sounds: Normal breath sounds.  Musculoskeletal: Normal range of motion.  Skin:    General: Skin is warm and dry.     Comments: Several smaller cracks in left heel, one large crack that is not infected. Dry skin diffusely  Neurological:     Mental Status: She is alert and oriented to person, place, and time.  Psychiatric:        Mood and Affect: Mood normal.        Thought Content: Thought content normal.        Judgment: Judgment normal.     Results for orders placed or performed during the hospital encounter of 06/08/18  Rapid Strep Screen ([facility] ONLY)  Result Value Ref Range   Streptococcus, Group A Screen (Direct) NEGATIVE NEGATIVE  Culture, group A strep  Result Value Ref Range   Specimen Description      THROAT Performed at [facility], [address] [place], [place] [address]    Special Requests      NONE Reflexed from [id number] Performed at [facility], [address], [place], [place] [address]    Culture      NO GROUP A STREP (S.PYOGENES) ISOLATED Performed at [facility], [place] [address], [place], [place] [address]    Report Status 06/10/2018 FINAL       Assessment & Plan:   Problem List Items Addressed This Visit      Cardiovascular and Mediastinum   Migraine    Improved on topamax, add imitrex for prn use for breakthrough migraines        Other   Insomnia - Primary    Improved on seroquel 25 mg nightly. Continue current regimen and good sleep hygiene        Other Visit Diagnoses    Foot pain, left       Cracking heels from dryness and walking barefoot often. regular moisturizing, soaks, wear shoes especially when walking around outside       Follow up plan: Return in about 6 months (around 01/29/2019) for Insomnia.

## 2018-07-31 NOTE — Assessment & Plan Note (Signed)
Improved on seroquel 25 mg nightly. Continue current regimen and good sleep hygiene

## 2018-07-31 NOTE — Assessment & Plan Note (Signed)
Improved on topamax, add imitrex for prn use for breakthrough migraines

## 2018-08-02 ENCOUNTER — Other Ambulatory Visit: Payer: Self-pay

## 2018-08-02 ENCOUNTER — Encounter: Payer: Self-pay | Admitting: Emergency Medicine

## 2018-08-02 ENCOUNTER — Ambulatory Visit
Admission: EM | Admit: 2018-08-02 | Discharge: 2018-08-02 | Disposition: A | Discharge status: Home / Self Care | Payer: BC Managed Care – PPO | Attending: Family Medicine | Admitting: Family Medicine

## 2018-08-02 DIAGNOSIS — J988 Other specified respiratory disorders: Secondary | ICD-10-CM | POA: Diagnosis not present

## 2018-08-02 DIAGNOSIS — B9789 Other viral agents as the cause of diseases classified elsewhere: Secondary | ICD-10-CM

## 2018-08-02 HISTORY — DX: Migraine, unspecified, not intractable, without status migrainosus: G43.909

## 2018-08-02 LAB — RAPID INFLUENZA A&B ANTIGENS
Influenza A (ARMC): NEGATIVE
Influenza B (ARMC): NEGATIVE

## 2018-08-02 MED ORDER — BENZONATATE 100 MG PO CAPS: 100.0000 mg | ORAL_CAPSULE | Freq: Three times a day (TID) | ORAL | 0 refills | Status: DC | PRN

## 2018-08-02 MED ORDER — IPRATROPIUM BROMIDE 0.06 % NA SOLN: 2.0000 | Freq: Four times a day (QID) | NASAL | 0 refills | Status: DC | PRN

## 2018-08-02 NOTE — ED Provider Notes (Addendum)
[facility]    CSN: [id number] Arrival date & time: 08/02/18  6546  History   Chief Complaint Chief Complaint  Patient presents with  . Generalized Body Aches  . Cough   HPI  58 year old female presents with respiratory symptoms.  Started yesterday.  She reports scratchy throat, runny nose, eye watering, body aches, mild cough.  She reports a slightly elevated temperature at 100.  She has been taking NyQuil without resolution.  Her symptoms are severe and interfering with sleep.  No known exacerbating factors.  No other associated symptoms.  No other complaints or concerns at this time.  PMH, Surgical Hx, Family Hx, Social History reviewed and updated as below.  Past Medical History:  Diagnosis Date  . Migraines     Patient Active Problem List   Diagnosis Date Noted  . Migraine 07/31/2018  . Insomnia 06/24/2018    Past Surgical History:  Procedure Laterality Date  . CESAREAN SECTION    . skin cancer removal    . TONSILLECTOMY      OB History   No obstetric history on file.      Home Medications    Prior to Admission medications   Medication Sig Start Date End Date Taking? Authorizing Provider  QUEtiapine (SEROQUEL) 50 MG tablet Take 1/2 - 1 tablet nightly for sleep 07/31/18  Yes Volney American, PA-C  SUMAtriptan (IMITREX) 50 MG tablet Take 1 tablet by mouth at onset of migraine. May repeat in 2 hours if headache persists or recurs. 07/31/18  Yes Volney American, PA-C  topiramate (TOPAMAX) 50 MG tablet Take 1 tablet (50 mg total) by mouth daily. 05/11/18  Yes Volney American, PA-C  benzonatate (TESSALON) 100 MG capsule Take 1 capsule (100 mg total) by mouth 3 (three) times daily as needed. 08/02/18   Thersa Salt G, DO  ipratropium (ATROVENT) 0.06 % nasal spray Place 2 sprays into both nostrils 4 (four) times daily as needed for rhinitis. 08/02/18   Coral Spikes, DO    Family History Family History  Problem Relation Age of Onset    . Heart disease Mother   . Hypertension Mother   . Stroke Mother   . Heart attack Father 20  . Heart disease Father   . Hypertension Father   . Drug abuse Sister   . Hodgkin's lymphoma Brother   . Heart attack Maternal Grandmother   . Heart attack Maternal Grandfather   . Heart attack Paternal Grandfather     Social History Social History   Tobacco Use  . Smoking status: Never Smoker  . Smokeless tobacco: Never Used  Substance Use Topics  . Alcohol use: Yes    Comment: occasionally  . Drug use: No     Allergies   Codeine   Review of Systems Review of Systems  HENT: Positive for rhinorrhea and sore throat.   Eyes:       Eye watering.  Respiratory: Positive for cough.   Musculoskeletal:       Body aches.   Physical Exam Triage Vital Signs ED Triage Vitals  Enc Vitals Group     BP 08/02/18 0836 (!) 137/94     Pulse Rate 08/02/18 0836 79     Resp 08/02/18 0836 18     Temp 08/02/18 0836 98.6 F (37 C)     Temp Source 08/02/18 0836 Oral     SpO2 08/02/18 0836 99 %     Weight 08/02/18 0832 180 lb (81.6 kg)  Height 08/02/18 0832 5\' 3"  (1.6 m)     Head Circumference --      Peak Flow --      Pain Score 08/02/18 0832 7     Pain Loc --      Pain Edu? --      Excl. in [place]? --    Updated Vital Signs BP (!) 137/94 (BP Location: Left Arm)   Pulse 79   Temp 98.6 F (37 C) (Oral)   Resp 18   Ht 5\' 3"  (1.6 m)   Wt 81.6 kg   SpO2 99%   BMI 31.89 kg/m   Visual Acuity Right Eye Distance:   Left Eye Distance:   Bilateral Distance:    Right Eye Near:   Left Eye Near:    Bilateral Near:     Physical Exam Vitals signs and nursing note reviewed.  Constitutional:      General: She is not in acute distress. HENT:     Head: Normocephalic and atraumatic.     Right Ear: Tympanic membrane normal.     Left Ear: Tympanic membrane normal.     Nose: Nose normal.  Eyes:     General:        Right eye: No discharge.        Left eye: No discharge.      Conjunctiva/sclera: Conjunctivae normal.  Neck:     Musculoskeletal: Neck supple.  Cardiovascular:     Rate and Rhythm: Normal rate and regular rhythm.  Pulmonary:     Effort: Pulmonary effort is normal.     Breath sounds: No wheezing, rhonchi or rales.  Neurological:     Mental Status: She is alert.  Psychiatric:        Mood and Affect: Mood normal.        Behavior: Behavior normal.    UC Treatments / Results  Labs (all labs ordered are listed, but only abnormal results are displayed) Labs Reviewed  RAPID INFLUENZA A&B ANTIGENS ([place])    EKG None  Radiology No results found.  Procedures Procedures (including critical care time)  Medications Ordered in UC Medications - No data to display  Initial Impression / Assessment and Plan / UC Course  I have reviewed the triage vital signs and the nursing notes.  Pertinent labs & imaging results that were available during my care of the patient were reviewed by me and considered in my medical decision making (see chart for details).    58 year old female presents with a viral respiratory infection.  Treating with Atrovent and Tessalon Per[place].  Supportive care.  Final Clinical Impressions(s) / UC Diagnoses   Final diagnoses:  Viral respiratory infection     Discharge Instructions     Flu was negative.  Rest, fluids.  Medications as prescribed.   Take care  Dr. Lacinda Axon    ED Prescriptions    Medication Sig Dispense Auth. Provider   ipratropium (ATROVENT) 0.06 % nasal spray Place 2 sprays into both nostrils 4 (four) times daily as needed for rhinitis. 15 mL Kemiah Booz G, DO   benzonatate (TESSALON) 100 MG capsule Take 1 capsule (100 mg total) by mouth 3 (three) times daily as needed. 30 capsule Coral Spikes, DO     Controlled Substance Prescriptions [place] Controlled Substance Registry consulted? Not Applicable     Coral Spikes, [place] 08/02/18 5188

## 2018-08-02 NOTE — ED Triage Notes (Signed)
Pt c/o fever, cough, body aches chills, and nasal congestion. Started yesterday morning.

## 2018-08-02 NOTE — Discharge Instructions (Addendum)
Flu was negative.  Rest, fluids.  Medications as prescribed.   Take care  Dr. Lacinda Axon

## 2018-08-08 ENCOUNTER — Encounter: Payer: Self-pay | Admitting: Family Medicine

## 2018-08-08 ENCOUNTER — Ambulatory Visit: Payer: BC Managed Care – PPO | Admitting: Family Medicine

## 2018-08-08 VITALS — BP 136/87 | HR 86 | Temp 98.1°F | Ht 63.0 in | Wt 201.0 lb

## 2018-08-08 DIAGNOSIS — J069 Acute upper respiratory infection, unspecified: Secondary | ICD-10-CM

## 2018-08-08 MED ORDER — PROMETHAZINE-DM 6.25-15 MG/5ML PO SYRP: 2.5000 mL | ORAL_SOLUTION | Freq: Four times a day (QID) | ORAL | 0 refills | Status: DC | PRN

## 2018-08-08 MED ORDER — AMOXICILLIN-POT CLAVULANATE 875-125 MG PO TABS: 1.0000 | ORAL_TABLET | Freq: Two times a day (BID) | ORAL | 0 refills | Status: DC

## 2018-08-08 NOTE — Progress Notes (Signed)
BP 136/87   Pulse 86   Temp 98.1 F (36.7 C) (Oral)   Ht 5\' 3"  (1.6 m)   Wt 201 lb (91.2 kg)   SpO2 98%   BMI 35.61 kg/m    Subjective:    Patient ID: Kendra Espinoza, female    DOB: Jul 16, 1961, 58 y.o.   MRN: [id number]  HPI: Kendra Espinoza is a 58 y.o. female  Chief Complaint  Patient presents with  . Follow-up  . URI    was seen at [facility] last thursday.was given benzonatate 100 mg and ipratropium 0.06% Feels like something is stuck in her throat. Nasal congestion. Woke up w/ Bruises of unknown etiology on right forarm   Here today with over a week of sore throat that feels like something is stuck in there, congestion, fatigue, aches, productive hacking cough. Tessalon and atrovent nasal spray she got from UC 6 days ago not helping at all. Using a humidifier and OTC remedies with no relief. Several sick contacts at work lately. No hx of pulmonary dz, non smoker.   Relevant past medical, surgical, family and social history reviewed and updated as indicated. Interim medical history since our last visit reviewed. Allergies and medications reviewed and updated.  Review of Systems  Per HPI unless specifically indicated above     Objective:    BP 136/87   Pulse 86   Temp 98.1 F (36.7 C) (Oral)   Ht 5\' 3"  (1.6 m)   Wt 201 lb (91.2 kg)   SpO2 98%   BMI 35.61 kg/m   Wt Readings from Last 3 Encounters:  08/08/18 201 lb (91.2 kg)  08/02/18 180 lb (81.6 kg)  07/31/18 201 lb 1.6 oz (91.2 kg)    Physical Exam Vitals signs and nursing note reviewed.  Constitutional:      Appearance: Normal appearance.  HENT:     Head: Atraumatic.     Right Ear: Tympanic membrane and external ear normal.     Left Ear: Tympanic membrane and external ear normal.     Nose: Congestion present.     Mouth/Throat:     Mouth: Mucous membranes are moist.     Pharynx: Posterior oropharyngeal erythema present.  Eyes:     Extraocular Movements: Extraocular movements intact.     Conjunctiva/sclera:  Conjunctivae normal.  Neck:     Musculoskeletal: Normal range of motion and neck supple.  Cardiovascular:     Rate and Rhythm: Normal rate and regular rhythm.     Heart sounds: Normal heart sounds.  Pulmonary:     Effort: Pulmonary effort is normal.     Breath sounds: Normal breath sounds. No wheezing.  Musculoskeletal: Normal range of motion.  Skin:    General: Skin is warm and dry.  Neurological:     Mental Status: She is alert and oriented to person, place, and time.  Psychiatric:        Mood and Affect: Mood normal.        Thought Content: Thought content normal.     Results for orders placed or performed during the hospital encounter of 08/02/18  Rapid Influenza A&B Antigens ([facility] only)  Result Value Ref Range   Influenza A ([facility]) NEGATIVE NEGATIVE   Influenza B ([facility]) NEGATIVE NEGATIVE      Assessment & Plan:   Problem List Items Addressed This Visit    None    Visit Diagnoses    Upper respiratory tract infection, unspecified type    -  Primary   Tx with augmentin, phenergan DM, mucinex, and supportive home care. F/u if worsening or not improving       Follow up plan: Return for as scheduled.

## 2018-08-17 ENCOUNTER — Encounter: Payer: Self-pay | Admitting: Family Medicine

## 2018-08-17 ENCOUNTER — Ambulatory Visit (INDEPENDENT_AMBULATORY_CARE_PROVIDER_SITE_OTHER): Payer: BC Managed Care – PPO | Admitting: Family Medicine

## 2018-08-17 VITALS — BP 145/94 | HR 62 | Temp 98.0°F | Wt 201.5 lb

## 2018-08-17 DIAGNOSIS — B029 Zoster without complications: Secondary | ICD-10-CM

## 2018-08-17 DIAGNOSIS — R0981 Nasal congestion: Secondary | ICD-10-CM

## 2018-08-17 MED ORDER — PREDNISONE 10 MG PO TABS: ORAL_TABLET | ORAL | 0 refills | Status: DC

## 2018-08-17 MED ORDER — VALACYCLOVIR HCL 1 G PO TABS: 1000.0000 mg | ORAL_TABLET | Freq: Three times a day (TID) | ORAL | 0 refills | Status: DC

## 2018-08-17 NOTE — Progress Notes (Signed)
BP (!) 145/94   Pulse 62   Temp 98 F (36.7 C) (Oral)   Wt 201 lb 8 oz (91.4 kg)   SpO2 98%   BMI 35.69 kg/m    Subjective:    Patient ID: Kendra Espinoza, female    DOB: 1961-05-25, 58 y.o.   MRN: [id number]  HPI: Kendra Espinoza is a 58 y.o. female  Chief Complaint  Patient presents with  . URI    pt states she feels like her URI is coming back  . Neck Pain    pt states she has had a pain in the back of her head that runs down her neck and across the top of her back    Here today with superficial pain of right posterior scalp the past few days that is so bad she can't lay down to sleep. Denies fevers, chills, visual changes, phonophobia, N/V. Hx of shingles of the head in the past. Has not tried anything for sxs.   Also feels like her URI sxs are returning the past few days with congestion and mild cough. Not trying anything currently for sxs. No fevers.   Relevant past medical, surgical, family and social history reviewed and updated as indicated. Interim medical history since our last visit reviewed. Allergies and medications reviewed and updated.  Review of Systems  Per HPI unless specifically indicated above     Objective:    BP (!) 145/94   Pulse 62   Temp 98 F (36.7 C) (Oral)   Wt 201 lb 8 oz (91.4 kg)   SpO2 98%   BMI 35.69 kg/m   Wt Readings from Last 3 Encounters:  08/17/18 201 lb 8 oz (91.4 kg)  08/08/18 201 lb (91.2 kg)  08/02/18 180 lb (81.6 kg)    Physical Exam Vitals signs and nursing note reviewed.  Constitutional:      Appearance: Normal appearance. She is not ill-appearing.  HENT:     Head: Atraumatic.     Right Ear: Tympanic membrane normal.     Left Ear: Tympanic membrane normal.     Nose: Congestion present.     Mouth/Throat:     Pharynx: Posterior oropharyngeal erythema present.  Eyes:     Extraocular Movements: Extraocular movements intact.     Conjunctiva/sclera: Conjunctivae normal.  Neck:     Musculoskeletal: Normal range of  motion and neck supple.  Cardiovascular:     Rate and Rhythm: Normal rate and regular rhythm.     Heart sounds: Normal heart sounds.  Pulmonary:     Effort: Pulmonary effort is normal.     Breath sounds: Normal breath sounds.  Musculoskeletal: Normal range of motion.  Skin:    General: Skin is warm and dry.     Findings: Rash (erythematous vesicular rash right posterior scalp, ttp) present.  Neurological:     Mental Status: She is alert and oriented to person, place, and time.  Psychiatric:        Mood and Affect: Mood normal.        Thought Content: Thought content normal.        Judgment: Judgment normal.     Results for orders placed or performed during the hospital encounter of 08/02/18  Rapid Influenza A&B Antigens ([facility] only)  Result Value Ref Range   Influenza A ([facility]) NEGATIVE NEGATIVE   Influenza B ([facility]) NEGATIVE NEGATIVE      Assessment & Plan:   Problem List Items Addressed This Visit  None    Visit Diagnoses    Herpes zoster without complication    -  Primary   Valtrex and prednisone sent, follow up if not improving. Work note provided   Relevant Medications   valACYclovir (VALTREX) 1000 MG tablet   Nasal congestion       Hoping prednisone will help with this as well. Restart nasal sprays, antihistamine       Follow up plan: Return if symptoms worsen or fail to improve.

## 2018-08-19 ENCOUNTER — Encounter: Payer: Self-pay | Admitting: Family Medicine

## 2018-08-20 ENCOUNTER — Other Ambulatory Visit: Payer: Self-pay | Admitting: Family Medicine

## 2018-08-20 ENCOUNTER — Encounter: Payer: Self-pay | Admitting: Family Medicine

## 2018-08-20 MED ORDER — CLOBETASOL PROPIONATE 0.05 % EX SOLN: 1.0000 "application " | Freq: Two times a day (BID) | CUTANEOUS | 0 refills | Status: DC

## 2018-08-20 MED ORDER — GABAPENTIN 300 MG PO CAPS: 300.0000 mg | ORAL_CAPSULE | Freq: Three times a day (TID) | ORAL | 0 refills | Status: DC | PRN

## 2018-08-22 ENCOUNTER — Encounter: Payer: Self-pay | Admitting: Family Medicine

## 2018-09-02 ENCOUNTER — Encounter: Payer: Self-pay | Admitting: Family Medicine

## 2018-09-05 ENCOUNTER — Ambulatory Visit: Payer: BC Managed Care – PPO | Admitting: Family Medicine

## 2018-09-05 ENCOUNTER — Encounter: Payer: Self-pay | Admitting: Family Medicine

## 2018-09-05 VITALS — BP 132/85 | HR 65 | Temp 97.7°F | Ht 63.0 in | Wt 198.0 lb

## 2018-09-05 DIAGNOSIS — R519 Headache, unspecified: Secondary | ICD-10-CM

## 2018-09-05 DIAGNOSIS — R270 Ataxia, unspecified: Secondary | ICD-10-CM | POA: Diagnosis not present

## 2018-09-05 DIAGNOSIS — R51 Headache: Secondary | ICD-10-CM | POA: Diagnosis not present

## 2018-09-05 MED ORDER — TOPIRAMATE 50 MG PO TABS: 50.0000 mg | ORAL_TABLET | Freq: Every day | ORAL | 3 refills | Status: DC

## 2018-09-05 NOTE — Progress Notes (Addendum)
BP 132/85   Pulse 65   Temp 97.7 F (36.5 C) (Oral)   Ht 5\' 3"  (1.6 m)   Wt 198 lb (89.8 kg)   SpO2 98%   BMI 35.07 kg/m    Subjective:    Patient ID: Kendra Espinoza, female    DOB: 01/12/1961, 58 y.o.   MRN: [id number]  HPI: Kendra Espinoza is a 58 y.o. female  Chief Complaint  Patient presents with  . Gait Problem    has been battling shingles x 3 weeks, [place] states she normally stands on one foot, and is no loner able to do that.   . Health Maintenance    Would like RX for Topamax 90 days supply   Here today with several days of acute onset dizziness, feeling off balance and wobbly when standing still, running into walls. Notes she tried to sit in a chair the other day and fell into the floor. Is recovering from suspected shingles of posterior scalp which is still mildly tender superficially but also now having some pressure and dull headache at base of skull that is not acting at all like a typical migraine for her would. No visual changes, speech or hearing changes, numbness, tingling, or weakness of extremities, recent injury to head, hx of neurologic issues aside from migraines which have been well controlled since starting topamax last year.   Relevant past medical, surgical, family and social history reviewed and updated as indicated. Interim medical history since our last visit reviewed. Allergies and medications reviewed and updated.  Review of Systems  Per HPI unless specifically indicated above     Objective:    BP 132/85   Pulse 65   Temp 97.7 F (36.5 C) (Oral)   Ht 5\' 3"  (1.6 m)   Wt 198 lb (89.8 kg)   SpO2 98%   BMI 35.07 kg/m   Wt Readings from Last 3 Encounters:  09/05/18 198 lb (89.8 kg)  08/17/18 201 lb 8 oz (91.4 kg)  08/08/18 201 lb (91.2 kg)    Physical Exam Vitals signs and nursing note reviewed.  Constitutional:      Appearance: Normal appearance. She is not ill-appearing.  HENT:     Head: Atraumatic.  Eyes:     Extraocular  Movements: Extraocular movements intact.     Conjunctiva/sclera: Conjunctivae normal.  Neck:     Musculoskeletal: Normal range of motion and neck supple.  Cardiovascular:     Rate and Rhythm: Normal rate and regular rhythm.     Heart sounds: Normal heart sounds.  Pulmonary:     Effort: Pulmonary effort is normal.     Breath sounds: Normal breath sounds.  Musculoskeletal: Normal range of motion.  Skin:    General: Skin is warm and dry.     Comments: Superficial ttp posterior scalp where rash has been present  Neurological:     Mental Status: She is alert and oriented to person, place, and time.     Gait: Gait abnormal.     Comments: No focal deficits noted on exam other than unsteady gait and mild sway on rhomberg  Negative pronator drift  Psychiatric:        Mood and Affect: Mood normal.        Thought Content: Thought content normal.        Judgment: Judgment normal.    Results for orders placed or performed during the hospital encounter of 08/02/18  Rapid Influenza A&B Antigens [facility] only)  Result Value  Ref Range   Influenza A ([facility]) NEGATIVE NEGATIVE   Influenza B ([facility]) NEGATIVE NEGATIVE      Assessment & Plan:   Problem List Items Addressed This Visit    None    Visit Diagnoses    Ataxia    -  Primary   Relevant Orders   Ambulatory referral to Neurology   Nonintractable headache, unspecified chronicity pattern, unspecified headache type       Relevant Medications   topiramate (TOPAMAX) 50 MG tablet    Will refer urgently the Neurology for further evaluation of acute onset sxs. Though this could be related to the possible shingles she's had the past few weeks, feel that sxs merit further investigation urgently. Pt has an appt at 12:30 this afternoon with Neurology. Discussed finding a ride to this appt and having her work send over paperwork to excuse her from work for these sxs.   Greater than 25 min spent in direct care and coordination today.  Follow up  plan: Return for Neurology consult as scheduled.

## 2018-09-11 ENCOUNTER — Encounter: Payer: Self-pay | Admitting: Family Medicine

## 2018-09-13 ENCOUNTER — Ambulatory Visit: Payer: BC Managed Care – PPO | Admitting: Family Medicine

## 2018-09-13 DIAGNOSIS — R519 Headache, unspecified: Secondary | ICD-10-CM | POA: Insufficient documentation

## 2018-09-13 DIAGNOSIS — R270 Ataxia, unspecified: Secondary | ICD-10-CM | POA: Insufficient documentation

## 2018-09-17 DIAGNOSIS — R262 Difficulty in walking, not elsewhere classified: Secondary | ICD-10-CM | POA: Insufficient documentation

## 2018-09-25 ENCOUNTER — Encounter: Payer: Self-pay | Admitting: Family Medicine

## 2018-09-25 ENCOUNTER — Ambulatory Visit: Payer: BC Managed Care – PPO | Admitting: Family Medicine

## 2018-09-25 VITALS — BP 128/88 | HR 88 | Temp 98.0°F | Ht 63.0 in | Wt 205.1 lb

## 2018-09-25 DIAGNOSIS — R197 Diarrhea, unspecified: Secondary | ICD-10-CM | POA: Diagnosis not present

## 2018-09-25 DIAGNOSIS — R1084 Generalized abdominal pain: Secondary | ICD-10-CM | POA: Diagnosis not present

## 2018-09-25 LAB — CBC WITH DIFFERENTIAL/PLATELET
Hematocrit: 42.3 % (ref 34.0–46.6)
Hemoglobin: 14.4 g/dL (ref 11.1–15.9)
Lymphocytes Absolute: 2.7 10*3/uL (ref 0.7–3.1)
Lymphs: 38 %
MCH: 30.3 pg (ref 26.6–33.0)
MCHC: 34 g/dL (ref 31.5–35.7)
MCV: 89 fL (ref 79–97)
MID (Absolute): 0.8 10*3/uL (ref 0.1–1.6)
MID: 11 %
Neutrophils Absolute: 3.6 10*3/uL (ref 1.4–7.0)
Neutrophils: 51 %
Platelets: 215 10*3/uL (ref 150–450)
RBC: 4.76 x10E6/uL (ref 3.77–5.28)
RDW: 13.7 % (ref 11.7–15.4)
WBC: 7.1 10*3/uL (ref 3.4–10.8)

## 2018-09-25 LAB — UA/M W/RFLX CULTURE, ROUTINE
Bilirubin, UA: NEGATIVE
Glucose, UA: NEGATIVE
Ketones, UA: NEGATIVE
Leukocytes, UA: NEGATIVE
Nitrite, UA: NEGATIVE
Protein, UA: NEGATIVE
RBC, UA: NEGATIVE
Specific Gravity, UA: <1.005 — ABNORMAL LOW (ref 1.005–1.030)
Urobilinogen, Ur: 0.2 mg/dL (ref 0.2–1.0)
pH, UA: 5.5 (ref 5.0–7.5)

## 2018-09-25 MED ORDER — ONDANSETRON 4 MG PO TBDP: 4.0000 mg | ORAL_TABLET | Freq: Three times a day (TID) | ORAL | 0 refills | Status: DC | PRN

## 2018-09-25 NOTE — Progress Notes (Signed)
BP 128/88 (BP Location: Right Arm, Patient Position: Sitting, Cuff Size: Normal)   Pulse 88   Temp 98 F (36.7 C) (Oral)   Ht 5\' 3"  (1.6 m)   Wt 205 lb 1.6 oz (93 kg)   LMP 05/09/2014 (Approximate)   SpO2 98%   BMI 36.33 kg/m    Subjective:    Patient ID: Kendra Espinoza, female    DOB: [birth year], 58 y.o.   MRN: [id number]  HPI: Kendra Espinoza is a 58 y.o. female  Chief Complaint  Patient presents with  . Nausea    Ongoing 5 days. Patient feels like she's going to vomit, but patient dry heaves.   . Diarrhea  . Fatigue  . Chills  . Excessive Sweating   5 days of abdominal pain, fatigue, diarrhea, nausea, dry heaves. Taking pepto which is helping some. Denies vomiting, fevers, chills, melena, inability to tolerate PO. No recent travel, new foods, sick contacts but did just start a new medicine. Was taken off the gabapentin and topamax and given nortriptyline by Neurologist last week.   Relevant past medical, surgical, family and social history reviewed and updated as indicated. Interim medical history since our last visit reviewed. Allergies and medications reviewed and updated.  Review of Systems  Per HPI unless specifically indicated above     Objective:    BP 128/88 (BP Location: Right Arm, Patient Position: Sitting, Cuff Size: Normal)   Pulse 88   Temp 98 F (36.7 C) (Oral)   Ht 5\' 3"  (1.6 m)   Wt 205 lb 1.6 oz (93 kg)   LMP 05/09/2014 (Approximate)   SpO2 98%   BMI 36.33 kg/m   Wt Readings from Last 3 Encounters:  09/25/18 205 lb 1.6 oz (93 kg)  09/05/18 198 lb (89.8 kg)  08/17/18 201 lb 8 oz (91.4 kg)    Physical Exam Vitals signs and nursing note reviewed.  Constitutional:      Appearance: Normal appearance. She is not ill-appearing.  HENT:     Head: Atraumatic.  Eyes:     Extraocular Movements: Extraocular movements intact.     Conjunctiva/sclera: Conjunctivae normal.  Neck:     Musculoskeletal: Normal range of motion and neck supple.    Cardiovascular:     Rate and Rhythm: Normal rate and regular rhythm.     Heart sounds: Normal heart sounds.  Pulmonary:     Effort: Pulmonary effort is normal.     Breath sounds: Normal breath sounds.  Abdominal:     General: Bowel sounds are normal.     Palpations: Abdomen is soft.     Tenderness: There is abdominal tenderness (mild ttp b/l lower abdomen). There is no right CVA tenderness, left CVA tenderness or guarding.  Musculoskeletal: Normal range of motion.  Skin:    General: Skin is warm and dry.  Neurological:     Mental Status: She is alert and oriented to person, place, and time.  Psychiatric:        Mood and Affect: Mood normal.        Thought Content: Thought content normal.        Judgment: Judgment normal.     Results for orders placed or performed in visit on 09/25/18  UA/M w/rflx Culture, Routine  Result Value Ref Range   Specific Gravity, UA <1.005 (L) 1.005 - 1.030   pH, UA 5.5 5.0 - 7.5   Color, UA Yellow Yellow   Appearance Ur Clear Clear   Leukocytes, UA  Negative Negative   Protein, UA Negative Negative/Trace   Glucose, UA Negative Negative   Ketones, UA Negative Negative   RBC, UA Negative Negative   Bilirubin, UA Negative Negative   Urobilinogen, Ur 0.2 0.2 - 1.0 mg/dL   Nitrite, UA Negative Negative  CBC With Differential/Platelet  Result Value Ref Range   WBC 7.1 3.4 - 10.8 x10E3/uL   RBC 4.76 3.77 - 5.28 x10E6/uL   Hemoglobin 14.4 11.1 - 15.9 g/dL   Hematocrit 42.3 34.0 - 46.6 %   MCV 89 79 - 97 fL   MCH 30.3 26.6 - 33.0 pg   MCHC 34.0 31.5 - 35.7 g/dL   RDW 13.7 11.7 - 15.4 %   Platelets 215 150 - 450 x10E3/uL   Neutrophils 51 Not Estab. %   Lymphs 38 Not Estab. %   MID 11 Not Estab. %   Neutrophils Absolute 3.6 1.4 - 7.0 x10E3/uL   Lymphocytes Absolute 2.7 0.7 - 3.1 x10E3/uL   MID (Absolute) 0.8 0.1 - 1.6 X10E3/uL      Assessment & Plan:   Problem List Items Addressed This Visit    None    Visit Diagnoses    Generalized  abdominal pain    -  Primary   Will check U/A and CBC to r/o infection, start zofran for nausea control and supportive care. Return precautions given   Diarrhea, unspecified type       Relevant Orders   UA/M w/rflx Culture, Routine (Completed)   CBC With Differential/Platelet (Completed)       Follow up plan: Return if symptoms worsen or fail to improve.

## 2018-10-16 ENCOUNTER — Encounter: Payer: Self-pay | Admitting: Family Medicine

## 2019-01-01 ENCOUNTER — Telehealth: Payer: Self-pay | Admitting: Family Medicine

## 2019-01-01 NOTE — Telephone Encounter (Signed)
Called pt to go over script screening no answer left vm

## 2019-01-02 ENCOUNTER — Encounter: Payer: Self-pay | Admitting: Family Medicine

## 2019-01-02 ENCOUNTER — Ambulatory Visit (INDEPENDENT_AMBULATORY_CARE_PROVIDER_SITE_OTHER): Payer: BC Managed Care – PPO | Admitting: Family Medicine

## 2019-01-02 ENCOUNTER — Other Ambulatory Visit: Payer: Self-pay

## 2019-01-02 VITALS — BP 124/88 | HR 80 | Temp 98.1°F | Ht 63.0 in | Wt 206.0 lb

## 2019-01-02 DIAGNOSIS — F5102 Adjustment insomnia: Secondary | ICD-10-CM | POA: Diagnosis not present

## 2019-01-02 DIAGNOSIS — G43909 Migraine, unspecified, not intractable, without status migrainosus: Secondary | ICD-10-CM

## 2019-01-02 DIAGNOSIS — Z1239 Encounter for other screening for malignant neoplasm of breast: Secondary | ICD-10-CM

## 2019-01-02 DIAGNOSIS — Z1211 Encounter for screening for malignant neoplasm of colon: Secondary | ICD-10-CM

## 2019-01-02 DIAGNOSIS — J029 Acute pharyngitis, unspecified: Secondary | ICD-10-CM | POA: Diagnosis not present

## 2019-01-02 DIAGNOSIS — Z114 Encounter for screening for human immunodeficiency virus [HIV]: Secondary | ICD-10-CM | POA: Diagnosis not present

## 2019-01-02 DIAGNOSIS — Z1159 Encounter for screening for other viral diseases: Secondary | ICD-10-CM

## 2019-01-02 MED ORDER — TOPIRAMATE 25 MG PO TABS: 25.0000 mg | ORAL_TABLET | Freq: Every day | ORAL | 0 refills | Status: DC

## 2019-01-02 MED ORDER — NORTRIPTYLINE HCL 10 MG PO CAPS: 40.0000 mg | ORAL_CAPSULE | Freq: Every day | ORAL | 0 refills | Status: DC

## 2019-01-02 NOTE — Patient Instructions (Signed)
Please call this number to schedule your mammogram. [phone] 

## 2019-01-02 NOTE — Progress Notes (Signed)
BP 124/88   Pulse 80   Temp 98.1 F (36.7 C) (Oral)   Ht 5\' 3"  (1.6 m)   Wt 206 lb (93.4 kg)   LMP 05/09/2014 (Approximate)   SpO2 96%   BMI 36.49 kg/m    Subjective:    Patient ID: Kendra Espinoza, female    DOB: [birth year], 58 y.o.   MRN: [id number]  HPI: Kendra Espinoza is a 58 y.o. female  Chief Complaint  Patient presents with  . Migraine    medication review   . Sore Throat    since this morning. scratchy   Here today for migraine and sleep f/u. Neurologist took her off topamax for concern it was causing her dizziness last year and started her on nortriptyline, ever since has had a headache daily. Felt much better on the topamax and wanting to go back on. Had been on it for about a year or so at that time without issue so feels her dizziness was unrelated to the medicine. Denies visual changes, confusion, falls, further dizziness.   Seroquel still working well for her for the most part, but sleeping poorly now that she's been back to work and her routines are shifted again. Previously doing well and thinks it's more about adjusting her schedule than the medicine not working. No side effects.   Has had a bit of a scratchy throat and some post nasal drainage since this morning. No other sxs, including fevers, chills, cough, CP, SOB. No sick contacts. Does have some intermittent allergies she's aware of. Not taking anything for sxs.   Relevant past medical, surgical, family and social history reviewed and updated as indicated. Interim medical history since our last visit reviewed. Allergies and medications reviewed and updated.  Review of Systems  Per HPI unless specifically indicated above     Objective:    BP 124/88   Pulse 80   Temp 98.1 F (36.7 C) (Oral)   Ht 5\' 3"  (1.6 m)   Wt 206 lb (93.4 kg)   LMP 05/09/2014 (Approximate)   SpO2 96%   BMI 36.49 kg/m   Wt Readings from Last 3 Encounters:  01/02/19 206 lb (93.4 kg)  09/25/18 205 lb 1.6 oz (93 kg)   09/05/18 198 lb (89.8 kg)    Physical Exam Vitals signs and nursing note reviewed.  Constitutional:      Appearance: Normal appearance. She is not ill-appearing.  HENT:     Head: Atraumatic.     Right Ear: Tympanic membrane normal.     Left Ear: Tympanic membrane normal.     Nose: Nose normal.     Mouth/Throat:     Pharynx: Posterior oropharyngeal erythema (mild posterior pharyngeal erythema) present.  Eyes:     Extraocular Movements: Extraocular movements intact.     Conjunctiva/sclera: Conjunctivae normal.  Neck:     Musculoskeletal: Normal range of motion and neck supple.  Cardiovascular:     Rate and Rhythm: Normal rate and regular rhythm.     Heart sounds: Normal heart sounds.  Pulmonary:     Effort: Pulmonary effort is normal.     Breath sounds: Normal breath sounds.  Musculoskeletal: Normal range of motion.  Skin:    General: Skin is warm and dry.  Neurological:     Mental Status: She is alert and oriented to person, place, and time.  Psychiatric:        Mood and Affect: Mood normal.        Thought Content:  Thought content normal.        Judgment: Judgment normal.     Results for orders placed or performed in visit on 01/02/19  HIV Antibody (routine testing w rflx)  Result Value Ref Range   HIV Screen 4th Generation wRfx Non Reactive Non Reactive  Hepatitis C antibody  Result Value Ref Range   Hep C Virus Ab <0.1 0.0 - 0.9 s/co ratio      Assessment & Plan:   Problem List Items Addressed This Visit      Cardiovascular and Mediastinum   Migraine - Primary    Will switch back to topamax given poor control at the moment. Monitor closely for return of dizziness sxs and switch off to something different if recurring      Relevant Medications   nortriptyline (PAMELOR) 10 MG capsule   topiramate (TOPAMAX) 25 MG tablet     Other   Insomnia    Continue seroquel, sleep hygiene and good routines reviewed       Other Visit Diagnoses    Sore throat        Suspect from post nasal drainage from allergies. Start flonase BID and f/u if worsening sxs   Encounter for screening for HIV       Relevant Orders   HIV Antibody (routine testing w rflx) (Completed)   Need for hepatitis C screening test       Relevant Orders   Hepatitis C antibody (Completed)   Colon cancer screening       Relevant Orders   Ambulatory referral to Gastroenterology   Breast cancer screening       Relevant Orders   MM DIGITAL SCREENING BILATERAL       Follow up plan: Return in about 4 weeks (around 01/30/2019) for Migraine f/u.

## 2019-01-03 LAB — HIV ANTIBODY (ROUTINE TESTING W REFLEX): HIV Screen 4th Generation wRfx: NONREACTIVE

## 2019-01-03 LAB — HEPATITIS C ANTIBODY: Hep C Virus Ab: <0.1 s/co ratio (ref 0.0–0.9)

## 2019-01-07 NOTE — Assessment & Plan Note (Signed)
Continue seroquel, sleep hygiene and good routines reviewed

## 2019-01-07 NOTE — Assessment & Plan Note (Signed)
Will switch back to topamax given poor control at the moment. Monitor closely for return of dizziness sxs and switch off to something different if recurring

## 2019-01-13 ENCOUNTER — Other Ambulatory Visit: Payer: Self-pay

## 2019-01-13 ENCOUNTER — Ambulatory Visit
Admission: EM | Admit: 2019-01-13 | Discharge: 2019-01-13 | Disposition: A | Discharge status: Home / Self Care | Payer: BC Managed Care – PPO | Attending: Family Medicine | Admitting: Family Medicine

## 2019-01-13 ENCOUNTER — Encounter: Payer: Self-pay | Admitting: Emergency Medicine

## 2019-01-13 DIAGNOSIS — L0291 Cutaneous abscess, unspecified: Secondary | ICD-10-CM | POA: Diagnosis not present

## 2019-01-13 MED ORDER — DOXYCYCLINE HYCLATE 100 MG PO CAPS: 100.0000 mg | ORAL_CAPSULE | Freq: Two times a day (BID) | ORAL | 0 refills | Status: DC

## 2019-01-13 NOTE — ED Provider Notes (Signed)
[facility]    CSN: [id number] Arrival date & time: 01/13/19  1416  History   Chief Complaint Chief Complaint  Patient presents with  . Insect Bite   HPI  58 year old female presents with a suspected insect bite.  Occurred on Friday.  Located just to the right of the sternum.  Patient reports pain, redness, and warmth at the site.  Pain is 7/10 in severity.  Patient is concerned that she may have a spider bite.  No fever.  No medications or interventions tried.  No other associated symptoms.  No other complaints.  PMH, Surgical Hx, Family Hx, Social History reviewed and updated as below.  Past Medical History:  Diagnosis Date  . Migraines   Hx of skin cancer  Patient Active Problem List   Diagnosis Date Noted  . Migraine 07/31/2018  . Insomnia 06/24/2018   Past Surgical History:  Procedure Laterality Date  . CESAREAN SECTION    . skin cancer removal    . TONSILLECTOMY      OB History   No obstetric history on file.      Home Medications    Prior to Admission medications   Medication Sig Start Date End Date Taking? Authorizing Provider  nortriptyline (PAMELOR) 10 MG capsule Take 4 capsules (40 mg total) by mouth daily. Nightly 01/02/19  Yes Volney American, PA-C  QUEtiapine (SEROQUEL) 50 MG tablet Take 1/2 - 1 tablet nightly for sleep 07/31/18  Yes Volney American, PA-C  topiramate (TOPAMAX) 25 MG tablet Take 1 tablet (25 mg total) by mouth daily. 01/02/19  Yes Volney American, PA-C  doxycycline (VIBRAMYCIN) 100 MG capsule Take 1 capsule (100 mg total) by mouth 2 (two) times daily. 01/13/19   Coral Spikes, DO    Family History Family History  Problem Relation Age of Onset  . Heart disease Mother   . Hypertension Mother   . Stroke Mother   . Heart attack Father 60  . Heart disease Father   . Hypertension Father   . Drug abuse Sister   . Hodgkin's lymphoma Brother   . Heart attack Maternal Grandmother   . Heart attack  Maternal Grandfather   . Heart attack Paternal Grandfather     Social History Social History   Tobacco Use  . Smoking status: Never Smoker  . Smokeless tobacco: Never Used  Substance Use Topics  . Alcohol use: Yes    Comment: occasionally  . Drug use: No     Allergies   Codeine   Review of Systems Review of Systems  Constitutional: Negative.   Skin:       Raised area that is painful - chest wall.   Physical Exam Triage Vital Signs ED Triage Vitals  Enc Vitals Group     BP 01/13/19 1428 (!) 150/97     Pulse Rate 01/13/19 1428 74     Resp 01/13/19 1428 16     Temp 01/13/19 1428 97.9 F (36.6 C)     Temp Source 01/13/19 1428 Oral     SpO2 01/13/19 1428 100 %     Weight 01/13/19 1425 185 lb (83.9 kg)     Height 01/13/19 1425 5\' 3"  (1.6 m)     Head Circumference --      Peak Flow --      Pain Score 01/13/19 1425 7     Pain Loc --      Pain Edu? --      Excl. in [place]? --  Updated Vital Signs BP (!) 150/97 (BP Location: Left Arm)   Pulse 74   Temp 97.9 F (36.6 C) (Oral)   Resp 16   Ht 5\' 3"  (1.6 m)   Wt 83.9 kg   LMP 05/09/2014 (Approximate)   SpO2 100%   BMI 32.77 kg/m   Visual Acuity Right Eye Distance:   Left Eye Distance:   Bilateral Distance:    Right Eye Near:   Left Eye Near:    Bilateral Near:     Physical Exam Vitals signs and nursing note reviewed.  Constitutional:      General: She is not in acute distress.    Appearance: Normal appearance. She is obese.  HENT:     Head: Normocephalic and atraumatic.  Eyes:     General:        Right eye: No discharge.        Left eye: No discharge.     Conjunctiva/sclera: Conjunctivae normal.  Pulmonary:     Effort: Pulmonary effort is normal. No respiratory distress.  Skin:    Comments: Raised area with surrounding erythema noted just to the right of the sternum.  Area is fluctuant.  Appears consistent with abscess.  Neurological:     Mental Status: She is alert.  Psychiatric:        Mood  and Affect: Mood normal.        Behavior: Behavior normal.    UC Treatments / Results  Labs (all labs ordered are listed, but only abnormal results are displayed) Labs Reviewed - No data to display  EKG None  Radiology No results found.  Procedures Incision and Drainage  Date/Time: 01/13/2019 3:30 PM Performed by: Coral Spikes, DO Authorized by: Coral Spikes, DO   Consent:    Consent obtained:  Verbal   Consent given by:  Patient Location:    Type:  Abscess   Location:  Trunk   Trunk location:  Chest Pre-procedure details:    Skin preparation:  Betadine Anesthesia (see MAR for exact dosages):    Anesthesia method:  Local infiltration   Local anesthetic:  Lidocaine 1% WITH epi Procedure type:    Complexity:  Simple Procedure details:    Incision types:  Stab incision   Drainage:  Purulent   Drainage amount:  Moderate   Wound treatment:  Wound left open   Packing materials:  None Post-procedure details:    Patient tolerance of procedure:  Tolerated well, no immediate complications   (including critical care time)  Medications Ordered in UC Medications - No data to display  Initial Impression / Assessment and Plan / UC Course  I have reviewed the triage vital signs and the nursing notes.  Pertinent labs & imaging results that were available during my care of the patient were reviewed by me and considered in my medical decision making (see chart for details).    58 year old female presents with abscess.  Incision and drainage performed today.  Doxycycline as directed.  Supportive care  Final Clinical Impressions(s) / UC Diagnoses   Final diagnoses:  Abscess   Discharge Instructions   None    ED Prescriptions    Medication Sig Dispense Auth. Provider   doxycycline (VIBRAMYCIN) 100 MG capsule Take 1 capsule (100 mg total) by mouth 2 (two) times daily. 14 capsule Coral Spikes, DO     Controlled Substance Prescriptions [place] Controlled Substance  Registry consulted? Not Applicable   Coral Spikes, DO 01/13/19 1530

## 2019-01-13 NOTE — ED Triage Notes (Signed)
Patient c/o possible spider bite to her chest on Friday.  Patient c/o redness and pain at the site.  Patient denies fevers.

## 2019-01-16 ENCOUNTER — Encounter: Payer: Self-pay | Admitting: Family Medicine

## 2019-01-21 ENCOUNTER — Other Ambulatory Visit: Payer: Self-pay | Admitting: Family Medicine

## 2019-01-23 ENCOUNTER — Other Ambulatory Visit: Payer: Self-pay

## 2019-01-23 ENCOUNTER — Ambulatory Visit: Payer: BC Managed Care – PPO | Admitting: Family Medicine

## 2019-01-23 ENCOUNTER — Encounter: Payer: Self-pay | Admitting: Family Medicine

## 2019-01-23 VITALS — BP 138/92 | HR 76 | Temp 98.3°F | Ht 63.0 in | Wt 202.0 lb

## 2019-01-23 DIAGNOSIS — L729 Follicular cyst of the skin and subcutaneous tissue, unspecified: Secondary | ICD-10-CM | POA: Diagnosis not present

## 2019-01-23 DIAGNOSIS — L089 Local infection of the skin and subcutaneous tissue, unspecified: Secondary | ICD-10-CM | POA: Diagnosis not present

## 2019-01-23 MED ORDER — SULFAMETHOXAZOLE-TRIMETHOPRIM 800-160 MG PO TABS: 1.0000 | ORAL_TABLET | Freq: Two times a day (BID) | ORAL | 0 refills | Status: DC

## 2019-01-23 NOTE — Progress Notes (Signed)
BP (!) 138/92   Pulse 76   Temp 98.3 F (36.8 C) (Oral)   Ht 5\' 3"  (1.6 m)   Wt 202 lb (91.6 kg)   LMP 05/09/2014 (Approximate)   SpO2 98%   BMI 35.78 kg/m    Subjective:    Patient ID: Kendra Espinoza, female    DOB: Feb 26, 1961, 57 y.o.   MRN: [id number]  HPI: Kendra Espinoza is a 58 y.o. female  Chief Complaint  Patient presents with  . Cyst    x 2 weeks. chest. states went to UC in [place].    Patient presenting today following up on an infected cyst on her chest. Went to UC in [place] for it over a week ago where it was drained and she was given doxycycline. Area improved but has now become painful, red and inflamed again since d/c of abx. No fevers, chills, injury, insect bites. Has not been trying anything topically.   Relevant past medical, surgical, family and social history reviewed and updated as indicated. Interim medical history since our last visit reviewed. Allergies and medications reviewed and updated.  Review of Systems  Per HPI unless specifically indicated above     Objective:    BP (!) 138/92   Pulse 76   Temp 98.3 F (36.8 C) (Oral)   Ht 5\' 3"  (1.6 m)   Wt 202 lb (91.6 kg)   LMP 05/09/2014 (Approximate)   SpO2 98%   BMI 35.78 kg/m   Wt Readings from Last 3 Encounters:  01/23/19 202 lb (91.6 kg)  01/13/19 185 lb (83.9 kg)  01/02/19 206 lb (93.4 kg)    Physical Exam Vitals signs and nursing note reviewed.  Constitutional:      Appearance: Normal appearance. She is not ill-appearing.  HENT:     Head: Atraumatic.  Eyes:     Extraocular Movements: Extraocular movements intact.     Conjunctiva/sclera: Conjunctivae normal.  Neck:     Musculoskeletal: Normal range of motion and neck supple.  Cardiovascular:     Rate and Rhythm: Normal rate and regular rhythm.     Heart sounds: Normal heart sounds.  Pulmonary:     Effort: Pulmonary effort is normal.     Breath sounds: Normal breath sounds.  Musculoskeletal: Normal range of motion.  Skin:   General: Skin is warm and dry.     Comments: Erythematous and edematous cyst above right breast toward center of chest, ttp. No active drainage or openings currently. Minimally fluctuant  Neurological:     Mental Status: She is alert and oriented to person, place, and time.  Psychiatric:        Mood and Affect: Mood normal.        Thought Content: Thought content normal.        Judgment: Judgment normal.     Results for orders placed or performed in visit on 01/02/19  HIV Antibody (routine testing w rflx)  Result Value Ref Range   HIV Screen 4th Generation wRfx Non Reactive Non Reactive  Hepatitis C antibody  Result Value Ref Range   Hep C Virus Ab <0.1 0.0 - 0.9 s/co ratio      Assessment & Plan:   Problem List Items Addressed This Visit    None    Visit Diagnoses    Infected cyst of skin    -  Primary   Will start bactrim, warm compresses. Refer to dermatology for cyst excision once infection improves. Return precautions given if worsening  Relevant Medications   sulfamethoxazole-trimethoprim (BACTRIM DS) 800-160 MG tablet   Other Relevant Orders   Ambulatory referral to Dermatology       Follow up plan: Return if symptoms worsen or fail to improve.

## 2019-01-24 ENCOUNTER — Other Ambulatory Visit: Payer: Self-pay | Admitting: Family Medicine

## 2019-02-01 ENCOUNTER — Ambulatory Visit (INDEPENDENT_AMBULATORY_CARE_PROVIDER_SITE_OTHER): Payer: BC Managed Care – PPO | Admitting: Family Medicine

## 2019-02-01 ENCOUNTER — Encounter: Payer: Self-pay | Admitting: Family Medicine

## 2019-02-01 ENCOUNTER — Other Ambulatory Visit: Payer: Self-pay

## 2019-02-01 VITALS — BP 124/87 | HR 78 | Temp 98.2°F | Ht 63.0 in | Wt 203.0 lb

## 2019-02-01 DIAGNOSIS — G43909 Migraine, unspecified, not intractable, without status migrainosus: Secondary | ICD-10-CM

## 2019-02-01 DIAGNOSIS — F5102 Adjustment insomnia: Secondary | ICD-10-CM

## 2019-02-01 MED ORDER — QUETIAPINE FUMARATE 50 MG PO TABS: ORAL_TABLET | ORAL | 1 refills | Status: DC

## 2019-02-01 MED ORDER — TOPIRAMATE 25 MG PO TABS: 25.0000 mg | ORAL_TABLET | Freq: Every day | ORAL | 1 refills | Status: DC

## 2019-02-01 NOTE — Progress Notes (Signed)
BP 124/87   Pulse 78   Temp 98.2 F (36.8 C) (Oral)   Ht 5\' 3"  (1.6 m)   Wt 203 lb (92.1 kg)   LMP 05/09/2014 (Approximate)   SpO2 98%   BMI 35.96 kg/m    Subjective:    Patient ID: Kendra Espinoza, female    DOB: [birth year], 58 y.o.   MRN: [id number]  HPI: Kendra Espinoza is a 58 y.o. female  Chief Complaint  Patient presents with  . Insomnia    f/u   Here today for insomnia and migraine f/u. Migraines much improved back on the topamax, only has had one since restarting last month. No side effects, including dizziness, taste changes, appetite changes. Continues to do very well on seroquel at bedtime for sleep. No issues or concerns there. Denies SI/HI, daytime grogginess, nighttime awakenings, vivid dreams.  Depression screen [facility] 2/9 01/02/2019 07/31/2018 03/27/2018  Decreased Interest 1 1 2   Down, Depressed, Hopeless 1 1 2   PHQ - 2 Score 2 2 4   Altered sleeping 2 1 3   Tired, decreased energy 2 1 3   Change in appetite 2 1 3   Feeling bad or failure about yourself  0 0 1  Trouble concentrating 2 0 2  Moving slowly or fidgety/restless 1 0 1  Suicidal thoughts 0 0 0  PHQ-9 Score 11 5 17     Relevant past medical, surgical, family and social history reviewed and updated as indicated. Interim medical history since our last visit reviewed. Allergies and medications reviewed and updated.  Review of Systems  Per HPI unless specifically indicated above     Objective:    BP 124/87   Pulse 78   Temp 98.2 F (36.8 C) (Oral)   Ht 5\' 3"  (1.6 m)   Wt 203 lb (92.1 kg)   LMP 05/09/2014 (Approximate)   SpO2 98%   BMI 35.96 kg/m   Wt Readings from Last 3 Encounters:  02/01/19 203 lb (92.1 kg)  01/23/19 202 lb (91.6 kg)  01/13/19 185 lb (83.9 kg)    Physical Exam Vitals signs and nursing note reviewed.  Constitutional:      Appearance: Normal appearance. She is not ill-appearing.  HENT:     Head: Atraumatic.  Eyes:     Extraocular Movements: Extraocular movements intact.      Conjunctiva/sclera: Conjunctivae normal.  Neck:     Musculoskeletal: Normal range of motion and neck supple.  Cardiovascular:     Rate and Rhythm: Normal rate and regular rhythm.     Heart sounds: Normal heart sounds.  Pulmonary:     Effort: Pulmonary effort is normal.     Breath sounds: Normal breath sounds.  Musculoskeletal: Normal range of motion.  Skin:    General: Skin is warm and dry.  Neurological:     Mental Status: She is alert and oriented to person, place, and time.  Psychiatric:        Mood and Affect: Mood normal.        Thought Content: Thought content normal.        Judgment: Judgment normal.     Results for orders placed or performed in visit on 01/02/19  HIV Antibody (routine testing w rflx)  Result Value Ref Range   HIV Screen 4th Generation wRfx Non Reactive Non Reactive  Hepatitis C antibody  Result Value Ref Range   Hep C Virus Ab <0.1 0.0 - 0.9 s/co ratio      Assessment & Plan:   Problem  List Items Addressed This Visit      Cardiovascular and Mediastinum   Migraine - Primary    Significantly improved back on topamax, continue current regimen      Relevant Medications   topiramate (TOPAMAX) 25 MG tablet     Other   Insomnia    Stable and under great control with seroquel at bedtime. Continue current regimen          Follow up plan: Return in about 6 months (around 08/04/2019) for CPE.

## 2019-02-05 NOTE — Assessment & Plan Note (Signed)
Significantly improved back on topamax, continue current regimen

## 2019-02-05 NOTE — Assessment & Plan Note (Signed)
Stable and under great control with seroquel at bedtime. Continue current regimen

## 2019-02-06 ENCOUNTER — Encounter: Payer: Self-pay | Admitting: Family Medicine

## 2019-02-13 ENCOUNTER — Encounter: Payer: Self-pay | Admitting: *Deleted

## 2019-03-06 ENCOUNTER — Encounter: Payer: Self-pay | Admitting: Family Medicine

## 2019-03-15 ENCOUNTER — Encounter: Payer: Self-pay | Admitting: Family Medicine

## 2019-03-15 ENCOUNTER — Ambulatory Visit (INDEPENDENT_AMBULATORY_CARE_PROVIDER_SITE_OTHER): Payer: BC Managed Care – PPO | Admitting: Family Medicine

## 2019-03-15 ENCOUNTER — Other Ambulatory Visit: Payer: Self-pay

## 2019-03-15 VITALS — BP 142/93 | HR 67 | Temp 98.1°F | Ht 63.0 in | Wt 203.0 lb

## 2019-03-15 DIAGNOSIS — Z6835 Body mass index (BMI) 35.0-35.9, adult: Secondary | ICD-10-CM

## 2019-03-15 DIAGNOSIS — E6609 Other obesity due to excess calories: Secondary | ICD-10-CM | POA: Diagnosis not present

## 2019-03-15 DIAGNOSIS — Z23 Encounter for immunization: Secondary | ICD-10-CM

## 2019-03-15 DIAGNOSIS — N941 Unspecified dyspareunia: Secondary | ICD-10-CM

## 2019-03-15 DIAGNOSIS — E669 Obesity, unspecified: Secondary | ICD-10-CM | POA: Insufficient documentation

## 2019-03-15 DIAGNOSIS — F5102 Adjustment insomnia: Secondary | ICD-10-CM

## 2019-03-15 MED ORDER — PREMARIN 0.625 MG/GM VA CREA: 1.0000 | TOPICAL_CREAM | Freq: Every day | VAGINAL | 2 refills | Status: DC

## 2019-03-15 MED ORDER — SERTRALINE HCL 50 MG PO TABS: 50.0000 mg | ORAL_TABLET | Freq: Every day | ORAL | 0 refills | Status: DC

## 2019-03-15 MED ORDER — TRAZODONE HCL 50 MG PO TABS: 25.0000 mg | ORAL_TABLET | Freq: Every evening | ORAL | 0 refills | Status: DC | PRN

## 2019-03-15 NOTE — Progress Notes (Signed)
BP (!) 142/93   Pulse 67   Temp 98.1 F (36.7 C) (Oral)   Ht 5\' 3"  (1.6 m)   Wt 203 lb (92.1 kg)   LMP 05/09/2014 (Approximate)   SpO2 98%   BMI 35.96 kg/m    Subjective:    Patient ID: Kendra Espinoza, female    DOB: Jun 19, 1961, 58 y.o.   MRN: [id number]  HPI: CHANDEL DINGMANN is a 58 y.o. female  Chief Complaint  Patient presents with  . Insomnia    pt states seroquel is not working well  . Other    pt states that she has had painfull sexual encounters x about 6 months but getting worse lately   Patient here concerned about her sleep. Used to take a whole seroquel but that was too sedating. Half a pill used to work well but now not seeming to work. Feels more depressed than before, crying often, lacking motivation. Feels the sleeplessness is not helping. Tried nortriptyline, cymbalta, paxil in the past which didn't seem to help. Denies SI/HI. Also notes she's been gaining weight lately and feels she hasn't changed her routine at all. Concerned about this.   Also concerned about 6 months or so of painful intercourse and dryness. Feels like she is disappointing her partner. No rashes, lesions, pelvic pain noted. No abnormal bleeding. Has not been trying any lubricants.   Relevant past medical, surgical, family and social history reviewed and updated as indicated. Interim medical history since our last visit reviewed. Allergies and medications reviewed and updated.  Review of Systems  Per HPI unless specifically indicated above     Objective:    BP (!) 142/93   Pulse 67   Temp 98.1 F (36.7 C) (Oral)   Ht 5\' 3"  (1.6 m)   Wt 203 lb (92.1 kg)   LMP 05/09/2014 (Approximate)   SpO2 98%   BMI 35.96 kg/m   Wt Readings from Last 3 Encounters:  03/15/19 203 lb (92.1 kg)  02/01/19 203 lb (92.1 kg)  01/23/19 202 lb (91.6 kg)    Physical Exam Vitals signs and nursing note reviewed.  Constitutional:      Appearance: Normal appearance. She is not ill-appearing.  HENT:   Head: Atraumatic.  Eyes:     Extraocular Movements: Extraocular movements intact.     Conjunctiva/sclera: Conjunctivae normal.  Neck:     Musculoskeletal: Normal range of motion and neck supple.  Cardiovascular:     Rate and Rhythm: Normal rate and regular rhythm.     Heart sounds: Normal heart sounds.  Pulmonary:     Effort: Pulmonary effort is normal.     Breath sounds: Normal breath sounds.  Musculoskeletal: Normal range of motion.  Skin:    General: Skin is warm and dry.  Neurological:     Mental Status: She is alert and oriented to person, place, and time.  Psychiatric:        Mood and Affect: Mood normal.        Thought Content: Thought content normal.        Judgment: Judgment normal.     Results for orders placed or performed in visit on 03/15/19  TSH  Result Value Ref Range   TSH 1.770 0.450 - 4.500 uIU/mL      Assessment & Plan:   Problem List Items Addressed This Visit      Other   Insomnia - Primary    D/c seroquel and trial trazodone to help moods and sleep.  Recheck in 1 month      Obesity    Will check TSH, discussed hormone changes post-menopause and how this can impact metabolism. May need to increase topamax to help with appetite. Increase exercise, continue healthy diet choices      Relevant Orders   TSH (Completed)    Other Visit Diagnoses    Dyspareunia, female       Suspect related to post-menopausal atrophy. Trial estrogen cream, use lubricants. F/u if not improving   Flu vaccine need       Relevant Orders   Flu Vaccine QUAD 36+ mos IM (Completed)       Follow up plan: Return in about 4 weeks (around 04/12/2019) for Mood, sleep f/u.

## 2019-03-16 LAB — TSH: TSH: 1.77 u[IU]/mL (ref 0.450–4.500)

## 2019-03-17 ENCOUNTER — Encounter: Payer: Self-pay | Admitting: Family Medicine

## 2019-03-18 ENCOUNTER — Other Ambulatory Visit: Payer: Self-pay | Admitting: Family Medicine

## 2019-03-18 NOTE — Assessment & Plan Note (Signed)
D/c seroquel and trial trazodone to help moods and sleep. Recheck in 1 month

## 2019-03-18 NOTE — Assessment & Plan Note (Signed)
Will check TSH, discussed hormone changes post-menopause and how this can impact metabolism. May need to increase topamax to help with appetite. Increase exercise, continue healthy diet choices

## 2019-03-22 ENCOUNTER — Other Ambulatory Visit: Payer: Self-pay | Admitting: Family Medicine

## 2019-03-25 ENCOUNTER — Ambulatory Visit: Payer: BC Managed Care – PPO | Admitting: Family Medicine

## 2019-03-27 ENCOUNTER — Ambulatory Visit: Payer: BC Managed Care – PPO | Admitting: Family Medicine

## 2019-04-07 ENCOUNTER — Encounter: Payer: Self-pay | Admitting: Family Medicine

## 2019-04-10 ENCOUNTER — Ambulatory Visit: Payer: BC Managed Care – PPO | Admitting: Family Medicine

## 2019-04-10 ENCOUNTER — Encounter: Payer: Self-pay | Admitting: Family Medicine

## 2019-04-10 ENCOUNTER — Other Ambulatory Visit: Payer: Self-pay

## 2019-04-10 VITALS — BP 124/81 | HR 71 | Temp 97.9°F | Ht 63.0 in | Wt 200.4 lb

## 2019-04-10 DIAGNOSIS — L7682 Other postprocedural complications of skin and subcutaneous tissue: Secondary | ICD-10-CM

## 2019-04-10 DIAGNOSIS — F339 Major depressive disorder, recurrent, unspecified: Secondary | ICD-10-CM | POA: Diagnosis not present

## 2019-04-10 DIAGNOSIS — F5102 Adjustment insomnia: Secondary | ICD-10-CM | POA: Diagnosis not present

## 2019-04-10 MED ORDER — BELSOMRA 10 MG PO TABS: 10.0000 mg | ORAL_TABLET | Freq: Every evening | ORAL | 0 refills | Status: DC | PRN

## 2019-04-10 NOTE — Assessment & Plan Note (Signed)
Has failed numerous agents in the past and this is impacting her work life as well as home life. Trial belsomra and good sleep hygiene

## 2019-04-10 NOTE — Assessment & Plan Note (Signed)
D/c trazodone, will re-try zoloft to see if this benefits her moods and anxiety.

## 2019-04-10 NOTE — Progress Notes (Signed)
BP 124/81 (BP Location: Right Arm, Patient Position: Sitting, Cuff Size: Normal)   Pulse 71   Temp 97.9 F (36.6 C) (Oral)   Ht 5\' 3"  (1.6 m)   Wt 200 lb 6.4 oz (90.9 kg)   LMP 05/09/2014 (Approximate)   BMI 35.50 kg/m    Subjective:    Patient ID: Kendra Espinoza, female    DOB: 1961-03-05, 58 y.o.   MRN: [id number]  HPI: Kendra Espinoza is a 58 y.o. female  Chief Complaint  Patient presents with  . Wound Check    Patient had cyst removed on chest 04/02/2019 at [facility]. Area is now red and painful.   . Medication Management    Patient states that after she took Sertraline and Trazadone, she had shakes, diarrhea and headaches. Patient stopped taking medication and patient hasn't had any other symptoms since.   Presenting today for several concerns.   Had a cyst removed from chest 1 week ago with 2 sutures in place, done through Dermatology. Having itching, some local redness and tenderness the past few days. Denies drainage, fevers, chills, body aches. Not using anything on area.   Stopped both the zoloft and trazodone after getting shakes, diarrhea, and nausea after starting them for a few days. That all resolved since d/c. Now having crying spells, anxiety, insomnia worsening. Denies SI/HI. Has tried seroquel, nortriptyline and numerous OTC remedies for sleep in the past with no relief.   Depression screen [facility] 2/9 03/15/2019 01/02/2019 07/31/2018  Decreased Interest 1 1 1   Down, Depressed, Hopeless 1 1 1   PHQ - 2 Score 2 2 2   Altered sleeping 3 2 1   Tired, decreased energy 3 2 1   Change in appetite 3 2 1   Feeling bad or failure about yourself  2 0 0  Trouble concentrating 3 2 0  Moving slowly or fidgety/restless 0 1 0  Suicidal thoughts 0 0 0  PHQ-9 Score 16 11 5    GAD 7 : Generalized Anxiety Score 03/15/2019 07/31/2018 03/27/2018  Nervous, Anxious, on Edge 3 1 2   Control/stop worrying 2 1 2   Worry too much - different things 2 1 2   Trouble relaxing 2 0 2   Restless 2 0 1  Easily annoyed or irritable 2 1 1   Afraid - awful might happen 0 1 0  Total GAD 7 Score 13 5 10   Anxiety Difficulty Somewhat difficult - -   Relevant past medical, surgical, family and social history reviewed and updated as indicated. Interim medical history since our last visit reviewed. Allergies and medications reviewed and updated.  Review of Systems  Per HPI unless specifically indicated above     Objective:    BP 124/81 (BP Location: Right Arm, Patient Position: Sitting, Cuff Size: Normal)   Pulse 71   Temp 97.9 F (36.6 C) (Oral)   Ht 5\' 3"  (1.6 m)   Wt 200 lb 6.4 oz (90.9 kg)   LMP 05/09/2014 (Approximate)   BMI 35.50 kg/m   Wt Readings from Last 3 Encounters:  04/10/19 200 lb 6.4 oz (90.9 kg)  03/15/19 203 lb (92.1 kg)  02/01/19 203 lb (92.1 kg)    Physical Exam Vitals signs and nursing note reviewed.  Constitutional:      Appearance: Normal appearance. She is not ill-appearing.  HENT:     Head: Atraumatic.  Eyes:     Extraocular Movements: Extraocular movements intact.     Conjunctiva/sclera: Conjunctivae normal.  Neck:     Musculoskeletal:  Normal range of motion and neck supple.  Cardiovascular:     Rate and Rhythm: Normal rate and regular rhythm.     Heart sounds: Normal heart sounds.  Pulmonary:     Effort: Pulmonary effort is normal.     Breath sounds: Normal breath sounds.  Musculoskeletal: Normal range of motion.  Skin:    General: Skin is warm and dry.     Comments: Well healing incision center chest, 2 sutures in place. No active drainage, edema, erythema, significant ttp  Neurological:     Mental Status: She is alert and oriented to person, place, and time.  Psychiatric:        Mood and Affect: Mood normal.        Thought Content: Thought content normal.        Judgment: Judgment normal.      Results for orders placed or performed in visit on 03/15/19  TSH  Result Value Ref Range   TSH 1.770 0.450 - 4.500 uIU/mL       Assessment & Plan:   Problem List Items Addressed This Visit      Other   Insomnia    Has failed numerous agents in the past and this is impacting her work life as well as home life. Trial belsomra and good sleep hygiene      Depression, recurrent ([place])    D/c trazodone, will re-try zoloft to see if this benefits her moods and anxiety.        Other Visit Diagnoses    Pain at surgical incision    -  Primary   From recent cyst excision. Appears to be healing well without evidence of infection. Reassurance given, home care reviewed       Follow up plan: Return in about 4 weeks (around 05/08/2019) for Mood, insomnia f/u.

## 2019-04-11 ENCOUNTER — Ambulatory Visit: Payer: BC Managed Care – PPO | Admitting: Family Medicine

## 2019-04-12 ENCOUNTER — Ambulatory Visit: Payer: BC Managed Care – PPO | Admitting: Family Medicine

## 2019-05-03 ENCOUNTER — Ambulatory Visit (INDEPENDENT_AMBULATORY_CARE_PROVIDER_SITE_OTHER): Payer: BC Managed Care – PPO | Admitting: Family Medicine

## 2019-05-03 ENCOUNTER — Other Ambulatory Visit: Payer: Self-pay

## 2019-05-03 ENCOUNTER — Encounter: Payer: Self-pay | Admitting: Family Medicine

## 2019-05-03 VITALS — BP 133/81 | HR 70 | Temp 98.1°F | Ht 63.0 in | Wt 199.0 lb

## 2019-05-03 DIAGNOSIS — G43909 Migraine, unspecified, not intractable, without status migrainosus: Secondary | ICD-10-CM

## 2019-05-03 DIAGNOSIS — F339 Major depressive disorder, recurrent, unspecified: Secondary | ICD-10-CM

## 2019-05-03 DIAGNOSIS — R197 Diarrhea, unspecified: Secondary | ICD-10-CM | POA: Diagnosis not present

## 2019-05-03 DIAGNOSIS — F5102 Adjustment insomnia: Secondary | ICD-10-CM

## 2019-05-03 DIAGNOSIS — R110 Nausea: Secondary | ICD-10-CM

## 2019-05-03 MED ORDER — PANTOPRAZOLE SODIUM 40 MG PO TBEC: 40.0000 mg | DELAYED_RELEASE_TABLET | Freq: Every day | ORAL | 3 refills | Status: DC

## 2019-05-03 MED ORDER — SERTRALINE HCL 100 MG PO TABS: 100.0000 mg | ORAL_TABLET | Freq: Every day | ORAL | 0 refills | Status: DC

## 2019-05-03 NOTE — Progress Notes (Signed)
BP 133/81   Pulse 70   Temp 98.1 F (36.7 C) (Oral)   Ht 5\' 3"  (1.6 m)   Wt 199 lb (90.3 kg)   LMP 05/09/2014 (Approximate)   SpO2 95%   BMI 35.25 kg/m    Subjective:    Patient ID: Kendra Espinoza, female    DOB: 1961/07/01, 58 y.o.   MRN: [id number]  HPI: Kendra Espinoza is a 58 y.o. female  Chief Complaint  Patient presents with  . Abdominal Pain    middle abdomen, x about a month.  . Diarrhea     at least twice amonth, diarrhea comes with pain at the same time  . Arm Pain    right arm. pt states she hit the door with her forearm this past Wednesday   Several times a week having diarrhea, abdominal pain and nausea. Trying pepto for this which does help a little. Seems to be hitting as soon as she lays down mid day to try to get some sleep before night shift. Lasts several hours and then seems to settle down. Denies fever, chills, vomiting (states on rare occasion she will but not typically), rashes, melena.   States the sertraline seems to help sometimes with anxiety but still having bad days. Tolerating well overall, no apparent side effects. Denies SI/HI.   Unable to get belsomra covered so back to taking half tab of seroquel. This seems to work fairly well for her. Whole tab is too sedating.   Depression screen [facility] 2/9 03/15/2019 01/02/2019 07/31/2018  Decreased Interest 1 1 1   Down, Depressed, Hopeless 1 1 1   PHQ - 2 Score 2 2 2   Altered sleeping 3 2 1   Tired, decreased energy 3 2 1   Change in appetite 3 2 1   Feeling bad or failure about yourself  2 0 0  Trouble concentrating 3 2 0  Moving slowly or fidgety/restless 0 1 0  Suicidal thoughts 0 0 0  PHQ-9 Score 16 11 5    GAD 7 : Generalized Anxiety Score 03/15/2019 07/31/2018 03/27/2018  Nervous, Anxious, on Edge 3 1 2   Control/stop worrying 2 1 2   Worry too much - different things 2 1 2   Trouble relaxing 2 0 2  Restless 2 0 1  Easily annoyed or irritable 2 1 1   Afraid - awful might happen 0 1 0  Total GAD 7 Score  13 5 10   Anxiety Difficulty Somewhat difficult - -   Relevant past medical, surgical, family and social history reviewed and updated as indicated. Interim medical history since our last visit reviewed. Allergies and medications reviewed and updated.  Review of Systems  Per HPI unless specifically indicated above     Objective:    BP 133/81   Pulse 70   Temp 98.1 F (36.7 C) (Oral)   Ht 5\' 3"  (1.6 m)   Wt 199 lb (90.3 kg)   LMP 05/09/2014 (Approximate)   SpO2 95%   BMI 35.25 kg/m   Wt Readings from Last 3 Encounters:  05/03/19 199 lb (90.3 kg)  04/10/19 200 lb 6.4 oz (90.9 kg)  03/15/19 203 lb (92.1 kg)    Physical Exam Vitals signs and nursing note reviewed.  Constitutional:      Appearance: Normal appearance. She is not ill-appearing.  HENT:     Head: Atraumatic.  Eyes:     Extraocular Movements: Extraocular movements intact.     Conjunctiva/sclera: Conjunctivae normal.  Neck:     Musculoskeletal: Normal range  of motion and neck supple.  Cardiovascular:     Rate and Rhythm: Normal rate and regular rhythm.     Heart sounds: Normal heart sounds.  Pulmonary:     Effort: Pulmonary effort is normal.     Breath sounds: Normal breath sounds.  Abdominal:     General: Bowel sounds are normal.     Palpations: Abdomen is soft.     Tenderness: There is no abdominal tenderness. There is no right CVA tenderness, left CVA tenderness or guarding.  Musculoskeletal: Normal range of motion.  Skin:    General: Skin is warm and dry.  Neurological:     Mental Status: She is alert and oriented to person, place, and time.  Psychiatric:        Mood and Affect: Mood normal.        Thought Content: Thought content normal.        Judgment: Judgment normal.     Results for orders placed or performed in visit on 03/15/19  TSH  Result Value Ref Range   TSH 1.770 0.450 - 4.500 uIU/mL      Assessment & Plan:   Problem List Items Addressed This Visit      Cardiovascular and  Mediastinum   Migraine    Stable on topamax, continue current regimen      Relevant Medications   sertraline (ZOLOFT) 100 MG tablet     Other   Insomnia    Fairly well controlled with seroquel, continue nightly as needed      Depression, recurrent (HCC)    Increase zoloft to 100 mg and continue to monitor closely for benefit      Relevant Medications   sertraline (ZOLOFT) 100 MG tablet    Other Visit Diagnoses    Diarrhea, unspecified type    -  Primary   Ongoing for months, missing lots of work. Will refer to GI for further evaluation.    Relevant Orders   Ambulatory referral to Gastroenterology   Nausea       Will tx with protonix in case reflux/gastritis cause. Possibly stress related given increased stressors and poor anxiety control       Follow up plan: Return for as scheduled.

## 2019-05-06 ENCOUNTER — Other Ambulatory Visit: Payer: Self-pay | Admitting: Family Medicine

## 2019-05-08 NOTE — Assessment & Plan Note (Signed)
Fairly well controlled with seroquel, continue nightly as needed

## 2019-05-08 NOTE — Assessment & Plan Note (Signed)
Increase zoloft to 100 mg and continue to monitor closely for benefit

## 2019-05-08 NOTE — Assessment & Plan Note (Signed)
Stable on topamax, continue current regimen

## 2019-05-09 ENCOUNTER — Encounter: Payer: Self-pay | Admitting: *Deleted

## 2019-05-10 ENCOUNTER — Ambulatory Visit: Payer: BC Managed Care – PPO | Admitting: Family Medicine

## 2019-05-14 ENCOUNTER — Other Ambulatory Visit: Payer: Self-pay | Admitting: Family Medicine

## 2019-05-14 MED ORDER — SUCRALFATE 1 G PO TABS: 1.0000 g | ORAL_TABLET | Freq: Three times a day (TID) | ORAL | 0 refills | Status: DC | PRN

## 2019-05-24 ENCOUNTER — Encounter: Payer: Self-pay | Admitting: Family Medicine

## 2019-05-24 ENCOUNTER — Other Ambulatory Visit: Payer: Self-pay

## 2019-05-24 ENCOUNTER — Ambulatory Visit (INDEPENDENT_AMBULATORY_CARE_PROVIDER_SITE_OTHER): Payer: BC Managed Care – PPO | Admitting: Family Medicine

## 2019-05-24 VITALS — BP 132/89 | HR 79 | Temp 98.5°F | Wt 195.0 lb

## 2019-05-24 DIAGNOSIS — R1012 Left upper quadrant pain: Secondary | ICD-10-CM | POA: Diagnosis not present

## 2019-05-24 DIAGNOSIS — R197 Diarrhea, unspecified: Secondary | ICD-10-CM

## 2019-05-24 MED ORDER — DICYCLOMINE HCL 10 MG PO CAPS: 10.0000 mg | ORAL_CAPSULE | Freq: Three times a day (TID) | ORAL | 0 refills | Status: DC

## 2019-05-24 NOTE — Progress Notes (Signed)
BP 132/89 (BP Location: Right Arm, Cuff Size: Normal)   Pulse 79   Temp 98.5 F (36.9 C) (Oral)   Wt 195 lb (88.5 kg)   LMP 05/09/2014 (Approximate)   SpO2 99%   BMI 34.54 kg/m    Subjective:    Patient ID: Kendra Espinoza, female    DOB: March 21, 1961, 58 y.o.   MRN: [id number]  HPI: Kendra Espinoza is a 58 y.o. female  Chief Complaint  Patient presents with  . ER Follow Up    pt states she felt good at the beginning of the week and then started having LUQ pain and diarrhea again on Tuesday   Here today for ER f/u for ongoing abdominal pain and diarrhea issues. Presented on 10/26 for LUQ pain, had lab workup and CT abdomen pelvis which were all unrevealing per record review. Given carafate and told to continue protonix suspecting gastritis. Felt fine for several days, then 4 days ago started again with the LUQ pain and nearly constant diarrhea. Starting to ease up today. Barely eating, just toast, soup, and other bland foods. Stools are very watery. Carafate and protonix not seeming to help much. No fevers, obvious stress triggers. Only noticeable thing seems to be when she lays down to go sleep or about 1 hour after eating something. Tried propping up to sleep but that didn't seem to help. Has a GI consultation scheduled for this coming up first week of December. Denies fever, chills, melena, vomiting, sick contacts.   Relevant past medical, surgical, family and social history reviewed and updated as indicated. Interim medical history since our last visit reviewed. Allergies and medications reviewed and updated.  Review of Systems  Per HPI unless specifically indicated above     Objective:    BP 132/89 (BP Location: Right Arm, Cuff Size: Normal)   Pulse 79   Temp 98.5 F (36.9 C) (Oral)   Wt 195 lb (88.5 kg)   LMP 05/09/2014 (Approximate)   SpO2 99%   BMI 34.54 kg/m   Wt Readings from Last 3 Encounters:  05/24/19 195 lb (88.5 kg)  05/03/19 199 lb (90.3 kg)  04/10/19 200 lb  6.4 oz (90.9 kg)    Physical Exam Vitals signs and nursing note reviewed.  Constitutional:      Appearance: Normal appearance. She is not ill-appearing.  HENT:     Head: Atraumatic.  Eyes:     Extraocular Movements: Extraocular movements intact.     Conjunctiva/sclera: Conjunctivae normal.  Neck:     Musculoskeletal: Normal range of motion and neck supple.  Cardiovascular:     Rate and Rhythm: Normal rate and regular rhythm.     Heart sounds: Normal heart sounds.  Pulmonary:     Effort: Pulmonary effort is normal.     Breath sounds: Normal breath sounds.  Abdominal:     General: Bowel sounds are normal. There is no distension.     Palpations: Abdomen is soft.     Tenderness: There is abdominal tenderness (moderate ttp mid abdomen diffusely).  Musculoskeletal: Normal range of motion.  Skin:    General: Skin is warm and dry.  Neurological:     Mental Status: She is alert and oriented to person, place, and time.  Psychiatric:        Mood and Affect: Mood normal.        Thought Content: Thought content normal.        Judgment: Judgment normal.     Results for orders  placed or performed in visit on 03/15/19  TSH  Result Value Ref Range   TSH 1.770 0.450 - 4.500 uIU/mL      Assessment & Plan:   Problem List Items Addressed This Visit    None    Visit Diagnoses    LUQ pain    -  Primary   Diarrhea, unspecified type        So far, workup unrevealing for etiology of her sxs. Given possible association with eating, will try bentyl to see if her pain is from bowel hyperactivity. Suspect her poorly controlled anxiety is playing a role here to some degree as well. Will call and see about moving her GI appt forward to as soon as possible as she continues to miss work and have difficulty performing her usual routine with this issue. Return precautions reviewed in meantime.   25 minutes spent today in direct care, coordination and counseling with patient.   Follow up plan:  Return for as scheduled.

## 2019-05-28 ENCOUNTER — Other Ambulatory Visit: Payer: Self-pay

## 2019-05-28 ENCOUNTER — Ambulatory Visit (INDEPENDENT_AMBULATORY_CARE_PROVIDER_SITE_OTHER): Payer: BC Managed Care – PPO | Admitting: Gastroenterology

## 2019-05-28 ENCOUNTER — Encounter: Payer: Self-pay | Admitting: Gastroenterology

## 2019-05-28 VITALS — BP 148/91 | HR 67 | Temp 98.2°F | Wt 194.5 lb

## 2019-05-28 DIAGNOSIS — R197 Diarrhea, unspecified: Secondary | ICD-10-CM

## 2019-05-28 MED ORDER — LOPERAMIDE HCL 2 MG PO CAPS: 2.0000 mg | ORAL_CAPSULE | ORAL | 0 refills | Status: DC | PRN

## 2019-05-28 MED ORDER — NA SULFATE-K SULFATE-MG SULF 17.5-3.13-1.6 GM/177ML PO SOLN: 354.0000 mL | Freq: Once | ORAL | 0 refills | Status: AC

## 2019-05-28 MED ORDER — BISACODYL EC 5 MG PO TBEC: DELAYED_RELEASE_TABLET | ORAL | 0 refills | Status: DC

## 2019-05-28 NOTE — Progress Notes (Signed)
Kendra Espinoza [address]  [place]  [place], [place] [address]  Main: [phone]  Fax: [phone]   Gastroenterology Consultation  Referring Provider:     Volney American,* Primary Care Physician:  Nyra Capes Reason for Consultation:    Abdominal pain, diarrhea        HPI:    Chief complaint: Diarrhea  Kendra Espinoza is a 58 y.o. y/o female referred for consultation & management  by Dr. Orene Desanctis, Lilia Argue, PA-C.  Patient reports 1 month history of intermittent diarrhea.  Does not occur daily.  Will have 2 to 3 days in a week with loose stools, without blood.  In between these episodes she will have 1 formed stool in a day.  On the days that she has loose stools it may be 4-6 bowel movements.  Has affected her work in that she has had to take off work due to being in the bathroom all day.  No prior colonoscopy.  No nausea or vomiting.  Has abdominal cramping associated with these symptoms.  No weight loss.  No family history of colon cancer.  Past Medical History:  Diagnosis Date  . Migraines     Past Surgical History:  Procedure Laterality Date  . CESAREAN SECTION    . skin cancer removal    . TONSILLECTOMY      Prior to Admission medications   Medication Sig Start Date End Date Taking? Authorizing Provider  conjugated estrogens (PREMARIN) vaginal cream Place 1 Applicatorful vaginally daily. 03/15/19  Yes Volney American, PA-C  QUEtiapine (SEROQUEL) 50 MG tablet Take 50 mg by mouth daily. Patient taking 1/2 of pill at bed time (25 mg total)   Yes [provider]  sertraline (ZOLOFT) 100 MG tablet Take 1 tablet (100 mg total) by mouth daily. 05/03/19  Yes Volney American, PA-C  topiramate (TOPAMAX) 25 MG tablet Take 1 tablet (25 mg total) by mouth daily. 02/01/19  Yes Volney American, PA-C  bisacodyl (BISACODYL) 5 MG EC tablet Take 2 tablets (10mg ) by mouth the day before your procedure between 1pm-3pm.  05/28/19   Virgel Manifold, MD  loperamide (IMODIUM) 2 MG capsule Take 1 capsule (2 mg total) by mouth as needed for up to 30 doses for diarrhea or loose stools (Take 4mg  for your first dose at the start of your diarrhea. Take 2mg  every 4-6 hrs after that for every loose BM. Wait till stool testing results to start taking the medicine.). 05/28/19   Virgel Manifold, MD  Na Sulfate-K Sulfate-Mg Sulf 17.5-3.13-1.6 GM/177ML SOLN Take 354 mLs by mouth once for 1 dose. 05/28/19 05/28/19  Virgel Manifold, MD    Family History  Problem Relation Age of Onset  . Heart disease Mother   . Hypertension Mother   . Stroke Mother   . Heart attack Father 31  . Heart disease Father   . Hypertension Father   . Drug abuse Sister   . Hodgkin's lymphoma Brother   . Heart attack Maternal Grandmother   . Heart attack Maternal Grandfather   . Heart attack Paternal Grandfather      Social History   Tobacco Use  . Smoking status: Never Smoker  . Smokeless tobacco: Never Used  Substance Use Topics  . Alcohol use: Not Currently    Comment: occasionally  . Drug use: No    Allergies as of 05/28/2019 - Review Complete 05/24/2019  Allergen Reaction Noted  . Codeine  08/02/2018    Review of Systems:    All systems reviewed and negative except where noted in HPI.   Physical Exam:  BP (!) 148/91 (BP Location: Left Arm, Patient Position: Sitting, Cuff Size: Normal)   Pulse 67   Temp 98.2 F (36.8 C) (Oral)   Wt 194 lb 8 oz (88.2 kg)   LMP 05/09/2014 (Approximate)   BMI 34.45 kg/m  Patient's last menstrual period was 05/09/2014 (approximate). Psych:  Alert and cooperative. Normal mood and affect. General:   Alert,  Well-developed, well-nourished, pleasant and cooperative in NAD Head:  Normocephalic and atraumatic. Eyes:  Sclera clear, no icterus.   Conjunctiva pink. Ears:  Normal auditory acuity. Nose:  No deformity, discharge, or lesions. Mouth:  No deformity or lesions,oropharynx  pink & moist. Neck:  Supple; no masses or thyromegaly. Abdomen:  Normal bowel sounds.  No bruits.  Soft, non-tender and non-distended without masses, hepatosplenomegaly or hernias noted.  No guarding or rebound tenderness.    Msk:  Symmetrical without gross deformities. Good, equal movement & strength bilaterally. Pulses:  Normal pulses noted. Extremities:  No clubbing or edema.  No cyanosis. Neurologic:  Alert and oriented x3;  grossly normal neurologically. Skin:  Intact without significant lesions or rashes. No jaundice. Lymph Nodes:  No significant cervical adenopathy. Psych:  Alert and cooperative. Normal mood and affect.   Labs: CBC    Component Value Date/Time   WBC 7.1 09/25/2018 1010   RBC 4.76 09/25/2018 1010   HGB 14.4 09/25/2018 1010   HCT 42.3 09/25/2018 1010   PLT 215 09/25/2018 1010   MCV 89 09/25/2018 1010   MCH 30.3 09/25/2018 1010   MCHC 34.0 09/25/2018 1010   RDW 13.7 09/25/2018 1010   LYMPHSABS 2.7 09/25/2018 1010   CMP  No results found for: NA, K, CL, CO2, GLUCOSE, BUN, CREATININE, CALCIUM, PROT, ALBUMIN, AST, ALT, ALKPHOS, BILITOT, GFRNONAA, GFRAA  Imaging Studies: No results found.  Assessment and Plan:   Kendra Espinoza is a 58 y.o. y/o female has been referred for diarrhea  Will obtain GI profile and rule out infectious causes  If no infection present, patient will need to proceed with colonoscopy to evaluate for any underlying colitis.  Although this is less likely given that the episodes are very intermittent instead of daily.  However, she has never had a colonoscopy before and therefore it would be useful to rule out any underlying lesions  I will stop her Protonix, sucralfate, Bentyl that was prescribed by primary care provider.  These are not helping the patient and could be contributing to her diarrhea as well.  She does not have any upper GI symptoms.  No heartburn, dysphagia, epigastric pain.  Her symptoms are mainly diarrhea with abdominal  cramping, therefore no indication for these medications at this time.  We will also give her a prescription for antidiarrheal.  However, she was asked not to start this until her stool studies are back and she verbalized understanding  I have discussed alternative options, risks & benefits,  which include, but are not limited to, bleeding, infection, perforation,respiratory complication & drug reaction.  The patient agrees with this plan & written consent will be obtained.    Dr Kendra Espinoza  Speech recognition software was used to dictate the above note.

## 2019-05-31 ENCOUNTER — Encounter: Payer: Self-pay | Admitting: Family Medicine

## 2019-05-31 ENCOUNTER — Other Ambulatory Visit: Payer: Self-pay

## 2019-05-31 ENCOUNTER — Ambulatory Visit (INDEPENDENT_AMBULATORY_CARE_PROVIDER_SITE_OTHER): Payer: BC Managed Care – PPO | Admitting: Family Medicine

## 2019-05-31 VITALS — BP 135/86 | HR 60 | Temp 98.2°F

## 2019-05-31 DIAGNOSIS — R21 Rash and other nonspecific skin eruption: Secondary | ICD-10-CM

## 2019-05-31 MED ORDER — NYSTATIN 100000 UNIT/GM EX CREA: 1.0000 "application " | TOPICAL_CREAM | Freq: Two times a day (BID) | CUTANEOUS | 0 refills | Status: DC

## 2019-05-31 NOTE — Progress Notes (Signed)
   BP 135/86   Pulse 60   Temp 98.2 F (36.8 C) (Oral)   LMP 05/09/2014 (Approximate)   SpO2 98%    Subjective:    Patient ID: Kendra Espinoza, female    DOB: 1961/06/25, 58 y.o.   MRN: [id number]  HPI: Kendra Espinoza is a 58 y.o. female  Chief Complaint  Patient presents with  . Rash    pt states she has a rash under her left breast and left breast, states the rash under her breast itches. States she first time she noticed it Monday morning    Itchy rash under left breast and a couple spots just popped up on neck. Using calamine with no relief. Denies pain, drainage, new soaps or detergents, fevers, outdoor exposures. Hx of shingles but does not feel the same with this rash.   Relevant past medical, surgical, family and social history reviewed and updated as indicated. Interim medical history since our last visit reviewed. Allergies and medications reviewed and updated.  Review of Systems  Per HPI unless specifically indicated above     Objective:    BP 135/86   Pulse 60   Temp 98.2 F (36.8 C) (Oral)   LMP 05/09/2014 (Approximate)   SpO2 98%   Wt Readings from Last 3 Encounters:  05/28/19 194 lb 8 oz (88.2 kg)  05/24/19 195 lb (88.5 kg)  05/03/19 199 lb (90.3 kg)    Physical Exam Vitals signs and nursing note reviewed.  Constitutional:      Appearance: Normal appearance. She is not ill-appearing.  HENT:     Head: Atraumatic.  Eyes:     Extraocular Movements: Extraocular movements intact.     Conjunctiva/sclera: Conjunctivae normal.  Neck:     Musculoskeletal: Normal range of motion and neck supple.  Cardiovascular:     Rate and Rhythm: Normal rate and regular rhythm.     Heart sounds: Normal heart sounds.  Pulmonary:     Effort: Pulmonary effort is normal.     Breath sounds: Normal breath sounds.  Musculoskeletal: Normal range of motion.  Skin:    General: Skin is warm and dry.     Findings: Rash (erythematous macular rash under left breast with some  isolated blister like areas, nontender, not draining) present.  Neurological:     Mental Status: She is alert and oriented to person, place, and time.  Psychiatric:        Mood and Affect: Mood normal.        Thought Content: Thought content normal.        Judgment: Judgment normal.     Results for orders placed or performed in visit on 03/15/19  TSH  Result Value Ref Range   TSH 1.770 0.450 - 4.500 uIU/mL      Assessment & Plan:   Problem List Items Addressed This Visit    None    Visit Diagnoses    Rash    -  Primary   Suspect yeast infection under breast, tx with nystatin and unscented powders. Area on neck appears more like dry skin - hydrocortisone, moisturizers prn       Follow up plan: Return for as scheduled.

## 2019-06-04 ENCOUNTER — Encounter: Payer: Self-pay | Admitting: *Deleted

## 2019-06-04 ENCOUNTER — Other Ambulatory Visit: Payer: Self-pay

## 2019-06-05 ENCOUNTER — Telehealth: Payer: Self-pay

## 2019-06-05 NOTE — Telephone Encounter (Signed)
Called patient to confirm patient procedure appointment and to remind patient that they have to go for a COVID test on 06/07/2019. Sent mychart message left a detail message

## 2019-06-06 LAB — GI PROFILE, STOOL, PCR

## 2019-06-07 ENCOUNTER — Other Ambulatory Visit: Payer: Self-pay

## 2019-06-07 ENCOUNTER — Other Ambulatory Visit
Admission: RE | Admit: 2019-06-07 | Discharge: 2019-06-07 | Disposition: A | Discharge status: Home / Self Care | Payer: BC Managed Care – PPO | Source: Ambulatory Visit | Attending: Gastroenterology | Admitting: Gastroenterology

## 2019-06-07 DIAGNOSIS — Z20828 Contact with and (suspected) exposure to other viral communicable diseases: Secondary | ICD-10-CM | POA: Diagnosis not present

## 2019-06-07 DIAGNOSIS — Z01812 Encounter for preprocedural laboratory examination: Secondary | ICD-10-CM | POA: Diagnosis not present

## 2019-06-07 LAB — SARS CORONAVIRUS 2 (TAT 6-24 HRS): SARS Coronavirus 2: NEGATIVE

## 2019-06-10 NOTE — Discharge Instructions (Signed)

## 2019-06-11 ENCOUNTER — Ambulatory Visit: Payer: BC Managed Care – PPO | Admitting: Anesthesiology

## 2019-06-11 ENCOUNTER — Ambulatory Visit
Admission: RE | Admit: 2019-06-11 | Discharge: 2019-06-11 | Disposition: A | Discharge status: Home / Self Care | Payer: BC Managed Care – PPO | Attending: Gastroenterology | Admitting: Gastroenterology

## 2019-06-11 ENCOUNTER — Other Ambulatory Visit: Payer: Self-pay

## 2019-06-11 ENCOUNTER — Encounter
Admission: RE | Disposition: A | Discharge status: Home / Self Care | Payer: Self-pay | Source: Home / Self Care | Attending: Gastroenterology

## 2019-06-11 DIAGNOSIS — Z79899 Other long term (current) drug therapy: Secondary | ICD-10-CM | POA: Diagnosis not present

## 2019-06-11 DIAGNOSIS — Z79890 Hormone replacement therapy (postmenopausal): Secondary | ICD-10-CM | POA: Insufficient documentation

## 2019-06-11 DIAGNOSIS — D120 Benign neoplasm of cecum: Secondary | ICD-10-CM | POA: Insufficient documentation

## 2019-06-11 DIAGNOSIS — K5730 Diverticulosis of large intestine without perforation or abscess without bleeding: Secondary | ICD-10-CM | POA: Diagnosis not present

## 2019-06-11 DIAGNOSIS — K648 Other hemorrhoids: Secondary | ICD-10-CM | POA: Diagnosis not present

## 2019-06-11 DIAGNOSIS — F329 Major depressive disorder, single episode, unspecified: Secondary | ICD-10-CM | POA: Insufficient documentation

## 2019-06-11 DIAGNOSIS — D122 Benign neoplasm of ascending colon: Secondary | ICD-10-CM | POA: Insufficient documentation

## 2019-06-11 DIAGNOSIS — Z1211 Encounter for screening for malignant neoplasm of colon: Secondary | ICD-10-CM

## 2019-06-11 DIAGNOSIS — K635 Polyp of colon: Secondary | ICD-10-CM

## 2019-06-11 HISTORY — PX: COLONOSCOPY WITH PROPOFOL: SHX5780

## 2019-06-11 HISTORY — PX: POLYPECTOMY: SHX5525

## 2019-06-11 SURGERY — COLONOSCOPY WITH PROPOFOL
Anesthesia: Monitor Anesthesia Care | Site: Rectum

## 2019-06-11 MED ORDER — ACETAMINOPHEN 160 MG/5ML PO SOLN: 325.0000 mg | ORAL | Status: DC | PRN

## 2019-06-11 MED ORDER — LIDOCAINE HCL (CARDIAC) PF 100 MG/5ML IV SOSY
PREFILLED_SYRINGE | INTRAVENOUS | Status: DC | PRN
  Administered 2019-06-11: 40 mg via INTRAVENOUS

## 2019-06-11 MED ORDER — PROPOFOL 10 MG/ML IV BOLUS
INTRAVENOUS | Status: DC | PRN
  Administered 2019-06-11 (×4): 50 mg via INTRAVENOUS
  Administered 2019-06-11: 60 mg via INTRAVENOUS
  Administered 2019-06-11: 40 mg via INTRAVENOUS

## 2019-06-11 MED ORDER — ONDANSETRON HCL 4 MG/2ML IJ SOLN: 4.0000 mg | Freq: Once | INTRAMUSCULAR | Status: DC | PRN

## 2019-06-11 MED ORDER — LACTATED RINGERS IV SOLN
INTRAVENOUS | Status: DC
  Administered 2019-06-11: 09:00:00 via INTRAVENOUS

## 2019-06-11 MED ORDER — ACETAMINOPHEN 325 MG PO TABS: 325.0000 mg | ORAL_TABLET | ORAL | Status: DC | PRN

## 2019-06-11 MED ORDER — STERILE WATER FOR IRRIGATION IR SOLN
Status: DC | PRN
  Administered 2019-06-11: 50 mL

## 2019-06-11 SURGICAL SUPPLY — 6 items
CANISTER SUCT 1200ML W/VALVE (MISCELLANEOUS) ×2 IMPLANT
FORCEPS BIOP RAD 4 LRG CAP 4 (CUTTING FORCEPS) ×2 IMPLANT
GOWN CVR UNV OPN BCK APRN NK (MISCELLANEOUS) ×2 IMPLANT
GOWN ISOL THUMB LOOP REG UNIV (MISCELLANEOUS) ×2
KIT ENDO PROCEDURE OLY (KITS) ×2 IMPLANT
WATER STERILE IRR 250ML POUR (IV SOLUTION) ×2 IMPLANT

## 2019-06-11 NOTE — H&P (Signed)
[name], MD [address] [place], [place], [place], [address] [address], [place], [place], [place], [address] Phone: [phone]  Fax: [phone]  Primary Care Physician:  Volney American, PA-C   Pre-Procedure History & Physical: HPI:  Kendra Espinoza is a 58 y.o. female is here for a colonoscopy.   Past Medical History:  Diagnosis Date  . Migraines    about 1x/month since starting meds    Past Surgical History:  Procedure Laterality Date  . CESAREAN SECTION    . skin cancer removal    . TONSILLECTOMY      Prior to Admission medications   Medication Sig Start Date End Date Taking? Authorizing Provider  conjugated estrogens (PREMARIN) vaginal cream Place 1 Applicatorful vaginally daily. 03/15/19  Yes Volney American, PA-C  loperamide (IMODIUM) 2 MG capsule Take 1 capsule (2 mg total) by mouth as needed for up to 30 doses for diarrhea or loose stools (Take 4mg  for your first dose at the start of your diarrhea. Take 2mg  every 4-6 hrs after that for every loose BM. Wait till stool testing results to start taking the medicine.). 05/28/19  Yes [name] B, MD  nystatin cream (MYCOSTATIN) Apply 1 application topically 2 (two) times daily. 05/31/19  Yes Volney American, PA-C  QUEtiapine (SEROQUEL) 50 MG tablet Take 50 mg by mouth daily. Patient taking 1/2 of pill at bed time (25 mg total)   Yes [provider]  topiramate (TOPAMAX) 25 MG tablet Take 1 tablet (25 mg total) by mouth daily. 02/01/19  Yes Volney American, PA-C  bisacodyl (BISACODYL) 5 MG EC tablet Take 2 tablets (10mg ) by mouth the day before your procedure between 1pm-3pm. Patient not taking: Reported on 05/31/2019 05/28/19   Virgel Manifold, MD  sertraline (ZOLOFT) 100 MG tablet Take 1 tablet (100 mg total) by mouth daily. Patient not taking: Reported on 06/04/2019 05/03/19   Volney American, PA-C    Allergies as of 05/28/2019 - Review Complete  05/24/2019  Allergen Reaction Noted  . Codeine  08/02/2018    Family History  Problem Relation Age of Onset  . Heart disease Mother   . Hypertension Mother   . Stroke Mother   . Heart attack Father 68  . Heart disease Father   . Hypertension Father   . Drug abuse Sister   . Hodgkin's lymphoma Brother   . Heart attack Maternal Grandmother   . Heart attack Maternal Grandfather   . Heart attack Paternal Grandfather     Social History   Socioeconomic History  . Marital status: Married    Spouse name: Not on file  . Number of children: Not on file  . Years of education: Not on file  . Highest education level: Not on file  Occupational History  . Not on file  Social Needs  . Financial resource strain: Not on file  . Food insecurity    Worry: Not on file    Inability: Not on file  . Transportation needs    Medical: Not on file    Non-medical: Not on file  Tobacco Use  . Smoking status: Never Smoker  . Smokeless tobacco: Never Used  Substance and Sexual Activity  . Alcohol use: Not Currently    Comment: occasionally  . Drug use: No  . Sexual activity: Yes  Lifestyle  . Physical activity    Days per week: Not on file    Minutes per session: Not on file  .  Stress: Not on file  Relationships  . Social Herbalist on phone: Not on file    Gets together: Not on file    Attends religious service: Not on file    Active member of club or organization: Not on file    Attends meetings of clubs or organizations: Not on file    Relationship status: Not on file  . Intimate partner violence    Fear of current or ex partner: Not on file    Emotionally abused: Not on file    Physically abused: Not on file    Forced sexual activity: Not on file  Other Topics Concern  . Not on file  Social History Narrative  . Not on file    Review of Systems: See HPI, otherwise negative ROS  Physical Exam: BP (!) 141/93   Pulse 91   Temp 97.9 F (36.6 C) (Temporal)   Resp  16   Ht 5\' 3"  (1.6 m)   Wt 85.4 kg   LMP 05/09/2014 (Approximate)   SpO2 100%   BMI 33.36 kg/m  General:   Alert,  pleasant and cooperative in NAD Head:  Normocephalic and atraumatic. Neck:  Supple; no masses or thyromegaly. Lungs:  Clear throughout to auscultation, normal respiratory effort.    Heart:  +S1, +S2, Regular rate and rhythm, No edema. Abdomen:  Soft, nontender and nondistended. Normal bowel sounds, without guarding, and without rebound.   Neurologic:  Alert and  oriented x4;  grossly normal neurologically.  Impression/Plan: Kendra Espinoza is here for a colonoscopy to be performed for average risk screening.  Risks, benefits, limitations, and alternatives regarding  colonoscopy have been reviewed with the patient.  Questions have been answered.  All parties agreeable.   Virgel Manifold, MD  06/11/2019, 9:59 AM

## 2019-06-11 NOTE — Anesthesia Postprocedure Evaluation (Signed)
Anesthesia Post Note  Patient: Kendra Espinoza  Procedure(s) Performed: COLONOSCOPY WITH BIOPSIES (N/A Rectum) POLYPECTOMY (N/A Rectum)     Anesthesia Post Evaluation  Sinda Du

## 2019-06-11 NOTE — Anesthesia Preprocedure Evaluation (Signed)
Anesthesia Evaluation  Patient identified by MRN, date of birth, ID band Patient awake    Reviewed: Allergy & Precautions, NPO status , Patient's Chart, lab work & pertinent test results, reviewed documented beta blocker date and time   History of Anesthesia Complications Negative for: history of anesthetic complications  Airway Mallampati: II  TM Distance: >3 FB Neck ROM: Full    Dental no notable dental hx.    Pulmonary neg pulmonary ROS,    Pulmonary exam normal breath sounds clear to auscultation       Cardiovascular Exercise Tolerance: Good negative cardio ROS Normal cardiovascular exam Rhythm:Regular Rate:Normal     Neuro/Psych  Headaches, PSYCHIATRIC DISORDERS Depression    GI/Hepatic negative GI ROS, Neg liver ROS,   Endo/Other  negative endocrine ROS  Renal/GU negative Renal ROS  negative genitourinary   Musculoskeletal negative musculoskeletal ROS (+)   Abdominal (+) + obese,  Abdomen: soft.    Peds  Hematology negative hematology ROS (+)   Anesthesia Other Findings   Reproductive/Obstetrics negative OB ROS                             Anesthesia Physical Anesthesia Plan  ASA: II  Anesthesia Plan: MAC and General   Post-op Pain Management:    Induction:   PONV Risk Score and Plan: 3 and Propofol infusion and Treatment may vary due to age or medical condition  Airway Management Planned: Nasal Cannula  Additional Equipment:   Intra-op Plan:   Post-operative Plan:   Informed Consent: I have reviewed the patients History and Physical, chart, labs and discussed the procedure including the risks, benefits and alternatives for the proposed anesthesia with the patient or authorized representative who has indicated his/her understanding and acceptance.     Dental advisory given  Plan Discussed with: CRNA  Anesthesia Plan Comments:         Anesthesia Quick  Evaluation  Patient Active Problem List   Diagnosis Date Noted  . Depression, recurrent ([place]) 04/10/2019  . Obesity 03/15/2019  . Migraine 07/31/2018  . Insomnia 06/24/2018    CBC Latest Ref Rng & Units 09/25/2018  WBC 3.4 - 10.8 x10E3/uL 7.1  Hemoglobin 11.1 - 15.9 g/dL 14.4  Hematocrit 34.0 - 46.6 % 42.3  Platelets 150 - 450 x10E3/uL 215   No flowsheet data found.  Risks and benefits of anesthesia discussed at length, patient or surrogate demonstrates understanding. Appropriately NPO. Plan to proceed with anesthesia.  Champ Mungo, MD 06/11/19

## 2019-06-11 NOTE — Op Note (Signed)
[facility] Patient Name: Kendra Espinoza Procedure Date: 06/11/2019 9:58 AM MRN: [id number] Account #: [id number] Date of Birth: June 06, 1961 Admit Type: Outpatient Age: 58 Room: [facility] OR ROOM 01 Gender: Female Note Status: Finalized Procedure:             Colonoscopy Indications:           Screening for colorectal malignant neoplasm Providers:             Etty Isaac B. Bonna Gains MD, MD Referring MD:          Volney American (Referring MD) Medicines:             Monitored Anesthesia Care Complications:         No immediate complications. Procedure:             Pre-Anesthesia Assessment:                        - ASA Grade Assessment: II - A patient with mild                         systemic disease.                        - Prior to the procedure, a History and Physical was                         performed, and patient medications, allergies and                         sensitivities were reviewed. The patient's tolerance                         of previous anesthesia was reviewed.                        - The risks and benefits of the procedure and the                         sedation options and risks were discussed with the                         patient. All questions were answered and informed                         consent was obtained.                        - Patient identification and proposed procedure were                         verified prior to the procedure by the physician, the                         nurse, the anesthesiologist, the anesthetist and the                         technician. The procedure was verified in the                         procedure room.  After obtaining informed consent, the colonoscope was                         passed under direct vision. Throughout the procedure,                         the patient's blood pressure, pulse, and oxygen                         saturations were  monitored continuously. The                         Colonoscope was introduced through the anus and                         advanced to the the terminal ileum. The colonoscopy                         was performed with ease. The patient tolerated the                         procedure well. The quality of the bowel preparation                         was good. Findings:      The perianal and digital rectal examinations were normal.      Two sessile polyps were found in the ascending colon and cecum. The       polyps were 3 to 4 mm in size. These polyps were removed with a cold       biopsy forceps. Resection and retrieval were complete.      A patchy area of mildly erythematous mucosa was found in the sigmoid       colon. Biopsies were taken with a cold forceps for histology. The       erythema was seen in a very small area of the sigmoid colon and was       biopsied. It was otherwise normal and normal sigmoid and rectum was also       biopsied.      The exam was otherwise without abnormality.      A few small-mouthed diverticula were found in the sigmoid colon.      The rectum, sigmoid colon, descending colon, transverse colon, ascending       colon, cecum and ileum appeared normal. Biopsies for histology were       taken with a cold forceps from the entire colon for evaluation of       microscopic colitis.      Non-bleeding internal hemorrhoids were found during retroflexion. Impression:            - Two 3 to 4 mm polyps in the ascending colon and in                         the cecum, removed with a cold biopsy forceps.                         Resected and retrieved.                        - Erythematous mucosa  in the sigmoid colon. Biopsied.                        - The examination was otherwise normal.                        - Diverticulosis in the sigmoid colon.                        - The rectum, sigmoid colon, descending colon,                         transverse colon, ascending  colon, cecum and terminal                         ileum are normal. Biopsied.                        - Non-bleeding internal hemorrhoids. Recommendation:        - Discharge patient to home (with escort).                        - Advance diet as tolerated.                        - Continue present medications.                        - Await pathology results.                        - Repeat colonoscopy date to be determined after                         pending pathology results are reviewed.                        - The findings and recommendations were discussed with                         the patient.                        - The findings and recommendations were discussed with                         the patient's family.                        - Return to primary care physician as previously                         scheduled. Procedure Code(s):     --- Professional ---                        [phone], Colonoscopy, flexible; with biopsy, single or                         multiple Diagnosis Code(s):     --- Professional ---  Z12.11, Encounter for screening for malignant neoplasm                         of colon                        K63.5, Polyp of colon                        K63.89, Other specified diseases of intestine CPT copyright 2019 American Medical Association. All rights reserved. The codes documented in this report are preliminary and upon coder review may  be revised to meet current compliance requirements.  [name], MD Margretta Sidle B. Bonna Gains MD, MD 06/11/2019 10:37:42 AM This report has been signed electronically. Number of Addenda: 0 Note Initiated On: 06/11/2019 9:58 AM Scope Withdrawal Time: 0 hours 15 minutes 15 seconds  Total Procedure Duration: 0 hours 19 minutes 1 second  Estimated Blood Loss:  Estimated blood loss: none.      [facility]

## 2019-06-11 NOTE — Transfer of Care (Signed)
Immediate Anesthesia Transfer of Care Note  Patient: Kendra Espinoza  Procedure(s) Performed: COLONOSCOPY WITH BIOPSIES (N/A Rectum) POLYPECTOMY (N/A Rectum)  Patient Location: PACU  Anesthesia Type: MAC, General  Level of Consciousness: awake, alert  and patient cooperative  Airway and Oxygen Therapy: Patient Spontanous Breathing and Patient connected to supplemental oxygen  Post-op Assessment: Post-op Vital signs reviewed, Patient's Cardiovascular Status Stable, Respiratory Function Stable, Patent Airway and No signs of Nausea or vomiting  Post-op Vital Signs: Reviewed and stable  Complications: No apparent anesthesia complications

## 2019-06-12 ENCOUNTER — Encounter: Payer: Self-pay | Admitting: Gastroenterology

## 2019-06-14 ENCOUNTER — Encounter: Payer: Self-pay | Admitting: Family Medicine

## 2019-06-17 ENCOUNTER — Encounter: Payer: Self-pay | Admitting: Gastroenterology

## 2019-06-19 ENCOUNTER — Telehealth: Payer: Self-pay

## 2019-06-19 ENCOUNTER — Ambulatory Visit: Payer: BC Managed Care – PPO | Admitting: Gastroenterology

## 2019-06-19 ENCOUNTER — Encounter: Payer: Self-pay | Admitting: Family Medicine

## 2019-06-19 NOTE — Telephone Encounter (Signed)
Patient verbalized understanding of results  

## 2019-06-19 NOTE — Telephone Encounter (Signed)
Called and left a message for call back. Put patient in recall list and updated health maintain

## 2019-06-19 NOTE — Telephone Encounter (Signed)
-----   Message from Virgel Manifold, MD sent at 06/19/2019  2:54 PM EST ----- Caryl Pina please let the patient know, her biopsies did not show colitis. The polyp was precancerous and repeat recommended in 5 yrs. Take the antidiarrheal and lets see how she does with that

## 2019-06-28 ENCOUNTER — Ambulatory Visit: Payer: BC Managed Care – PPO | Admitting: Family Medicine

## 2019-06-28 ENCOUNTER — Other Ambulatory Visit: Payer: Self-pay

## 2019-06-28 ENCOUNTER — Encounter: Payer: Self-pay | Admitting: Family Medicine

## 2019-06-28 VITALS — BP 131/86 | HR 72 | Temp 98.3°F | Ht 63.0 in | Wt 191.0 lb

## 2019-06-28 DIAGNOSIS — G43909 Migraine, unspecified, not intractable, without status migrainosus: Secondary | ICD-10-CM

## 2019-06-28 DIAGNOSIS — F339 Major depressive disorder, recurrent, unspecified: Secondary | ICD-10-CM

## 2019-06-28 MED ORDER — SUMATRIPTAN SUCCINATE 100 MG PO TABS: ORAL_TABLET | ORAL | 3 refills | Status: DC

## 2019-06-28 MED ORDER — TOPIRAMATE 25 MG PO TABS: 25.0000 mg | ORAL_TABLET | Freq: Two times a day (BID) | ORAL | 0 refills | Status: DC

## 2019-06-28 MED ORDER — BUPROPION HCL ER (XL) 150 MG PO TB24: 150.0000 mg | ORAL_TABLET | Freq: Every day | ORAL | 0 refills | Status: DC

## 2019-06-28 MED ORDER — KETOROLAC TROMETHAMINE 60 MG/2ML IM SOLN
60.0000 mg | Freq: Once | INTRAMUSCULAR | Status: AC
  Administered 2019-06-28: 60 mg via INTRAMUSCULAR

## 2019-06-28 NOTE — Assessment & Plan Note (Signed)
Struggling right now, not feeling benefit on zoloft. Taper off, start wellbutrin and f/u in 1 month

## 2019-06-28 NOTE — Assessment & Plan Note (Signed)
Increase topamax to 25 mg BID, add imitrex prn. IM toradol given today. F/u if not improving

## 2019-06-28 NOTE — Progress Notes (Signed)
BP 131/86   Pulse 72   Temp 98.3 F (36.8 C) (Oral)   Ht 5\' 3"  (1.6 m)   Wt 191 lb (86.6 kg)   LMP 05/09/2014 (Approximate)   SpO2 97%   BMI 33.83 kg/m    Subjective:    Patient ID: Kendra Espinoza, female    DOB: June 29, 1961, 58 y.o.   MRN: [id number]  HPI: Kendra Espinoza is a 58 y.o. female  Chief Complaint  Patient presents with  . Headache    x 2 days. pt states that her migrain medication seems not working well   Worsened migraines since her GI issues have come on a few months ago. Current episode has lasted the past 2 days. Not improved with imitrex. Had tried cymbalta in the past as well as nortriptyline and didn't like either. Following with GI for her stomach issues but no answers yet. Denies confusion, vision changes, intractable vomiting.   Also feels her moods and anxiety are under very poor control currently. Significant anhedonia lately. Used to love the holidays and decorating but has not even put up [employer] yet. Does not feel the zoloft is working much anymore for her. Denies SI/HI.    Relevant past medical, surgical, family and social history reviewed and updated as indicated. Interim medical history since our last visit reviewed. Allergies and medications reviewed and updated.  Review of Systems  Per HPI unless specifically indicated above     Objective:    BP 131/86   Pulse 72   Temp 98.3 F (36.8 C) (Oral)   Ht 5\' 3"  (1.6 m)   Wt 191 lb (86.6 kg)   LMP 05/09/2014 (Approximate)   SpO2 97%   BMI 33.83 kg/m   Wt Readings from Last 3 Encounters:  06/28/19 191 lb (86.6 kg)  06/11/19 188 lb 4.8 oz (85.4 kg)  05/28/19 194 lb 8 oz (88.2 kg)    Physical Exam Vitals and nursing note reviewed.  Constitutional:      Appearance: Normal appearance. She is not ill-appearing.  HENT:     Head: Atraumatic.  Eyes:     Extraocular Movements: Extraocular movements intact.     Conjunctiva/sclera: Conjunctivae normal.  Cardiovascular:     Rate  and Rhythm: Normal rate and regular rhythm.     Heart sounds: Normal heart sounds.  Pulmonary:     Effort: Pulmonary effort is normal.     Breath sounds: Normal breath sounds.  Musculoskeletal:        General: Normal range of motion.     Cervical back: Normal range of motion and neck supple.  Skin:    General: Skin is warm and dry.  Neurological:     Mental Status: She is alert and oriented to person, place, and time.  Psychiatric:        Mood and Affect: Mood normal.        Thought Content: Thought content normal.        Judgment: Judgment normal.     Results for orders placed or performed during the hospital encounter of 06/07/19  SARS CORONAVIRUS 2 (TAT 6-24 HRS) Nasopharyngeal Nasopharyngeal Swab   Specimen: Nasopharyngeal Swab  Result Value Ref Range   SARS Coronavirus 2 NEGATIVE NEGATIVE      Assessment & Plan:   Problem List Items Addressed This Visit      Cardiovascular and Mediastinum   Migraine - Primary    Increase topamax to 25 mg BID, add imitrex prn. IM toradol  given today. F/u if not improving      Relevant Medications   topiramate (TOPAMAX) 25 MG tablet   buPROPion (WELLBUTRIN XL) 150 MG 24 hr tablet   SUMAtriptan (IMITREX) 100 MG tablet     Other   Depression, recurrent ([place])    Struggling right now, not feeling benefit on zoloft. Taper off, start wellbutrin and f/u in 1 month      Relevant Medications   buPROPion (WELLBUTRIN XL) 150 MG 24 hr tablet       Follow up plan: Return in about 4 weeks (around 07/26/2019) for Mood, migraine f/u.

## 2019-07-09 ENCOUNTER — Ambulatory Visit (INDEPENDENT_AMBULATORY_CARE_PROVIDER_SITE_OTHER): Payer: BC Managed Care – PPO | Admitting: Gastroenterology

## 2019-07-09 ENCOUNTER — Encounter: Payer: Self-pay | Admitting: Gastroenterology

## 2019-07-09 ENCOUNTER — Other Ambulatory Visit: Payer: Self-pay

## 2019-07-09 DIAGNOSIS — R109 Unspecified abdominal pain: Secondary | ICD-10-CM

## 2019-07-09 DIAGNOSIS — R197 Diarrhea, unspecified: Secondary | ICD-10-CM | POA: Diagnosis not present

## 2019-07-09 MED ORDER — METAMUCIL SMOOTH TEXTURE 58.6 % PO POWD: 1.0000 | Freq: Every day | ORAL | 1 refills | Status: DC

## 2019-07-10 NOTE — Progress Notes (Signed)
Kendra Antigua, MD [address]  [place]  [place], [place] [address]  Main: [phone]  Fax: [phone]   Primary Care Physician: Volney American, PA-C  Virtual Visit via Video Note  I connected with patient on 07/10/19 at  3:30 PM EST by video (using [email or web address]) and verified that I am speaking with the correct person using two identifiers.   I discussed the limitations, risks, security and privacy concerns of performing an evaluation and management service by video and the availability of in person appointments. I also discussed with the patient that there may be a patient responsible charge related to this service. The patient expressed understanding and agreed to proceed.  Location of Patient: Home Location of Provider: Home Persons involved: Patient and provider only (Nursing staff checked in patient via phone but were not physically involved in the video interaction - see their notes)   History of Present Illness: Chief Complaint  Patient presents with  . Diarrhea    Patient states she has this a few times a week. She has abdominal pain     HPI: Kendra Espinoza is a 58 y.o. female here for follow-up of diarrhea.  Underwent colonoscopy which showed 2 subcentimeter polyps that were removed.  Sigmoid erythema.  Diverticulosis.  Internal hemorrhoids.  Biopsies did not show microscopic colitis.  Polyp were precancerous and repeat recommended in 5 years.  Antidiarrheal was prescribed and patient states this helps when she takes it.  Taking it about twice a day on the days that she is having diarrhea.  However, sometimes will get constipated and will stop taking it.  No blood in stool.  Current Outpatient Medications  Medication Sig Dispense Refill  . buPROPion (WELLBUTRIN XL) 150 MG 24 hr tablet Take 1 tablet (150 mg total) by mouth daily. 30 tablet 0  . conjugated estrogens (PREMARIN) vaginal cream Place 1 Applicatorful vaginally daily. 42.5 g 2  . QUEtiapine  (SEROQUEL) 50 MG tablet Take 50 mg by mouth daily. Patient taking 1/2 of pill at bed time (25 mg total)    . SUMAtriptan (IMITREX) 100 MG tablet Take 1 tab at onset of migraine. May repeat in 2 hours if headache persists or recurs. 10 tablet 3  . topiramate (TOPAMAX) 25 MG tablet Take 1 tablet (25 mg total) by mouth 2 (two) times daily. 60 tablet 0  . psyllium (METAMUCIL SMOOTH TEXTURE) 58.6 % powder Take 1 packet by mouth daily. 283 g 1   No current facility-administered medications for this visit.    Allergies as of 07/09/2019 - Review Complete 07/09/2019  Allergen Reaction Noted  . Codeine  08/02/2018    Review of Systems:    All systems reviewed and negative except where noted in HPI.   Observations/Objective:  Labs: CMP  No results found for: NA, K, CL, CO2, GLUCOSE, BUN, CREATININE, CALCIUM, PROT, ALBUMIN, AST, ALT, ALKPHOS, BILITOT, GFRNONAA, GFRAA Lab Results  Component Value Date   WBC 7.1 09/25/2018   HGB 14.4 09/25/2018   HCT 42.3 09/25/2018   MCV 89 09/25/2018   PLT 215 09/25/2018    Imaging Studies: No results found.  Assessment and Plan:   Kendra Espinoza is a 58 y.o. y/o female here for follow-up of diarrhea  Assessment and Plan: Work-up so far has been reassuring  Since patient is also getting constipated with antidiarrheal, will start Metamucil to help on days of constipation and bulk her stool on the days that she is having diarrhea.  Continue  antidiarrheal in conjunction like she is about 2-3 times a week  If symptoms do not get better with Metamucil she was advised to let us know and she verbalized understanding  We will also check celiac panel  Follow Up Instructions:    I discussed the assessment and treatment plan with the patient. The patient was provided an opportunity to ask questions and all were answered. The patient agreed with the plan and demonstrated an understanding of the instructions.   The patient was advised to call back or seek  an in-person evaluation if the symptoms worsen or if the condition fails to improve as anticipated.  I provided 15 minutes of face-to-face time via video software during this encounter. Additional time was spent in reviewing patient's chart, placing orders etc.   Virgel Manifold, MD  Speech recognition software was used to dictate this note.

## 2019-07-12 LAB — CELIAC AB TTG DGP TIGA
Antigliadin Abs, IgA: 3 units (ref 0–19)
Gliadin IgG: 3 units (ref 0–19)
IgA/Immunoglobulin A, Serum: 137 mg/dL (ref 87–352)
Tissue Transglut Ab: <2 U/mL (ref 0–5)
Transglutaminase IgA: <2 U/mL (ref 0–3)

## 2019-07-12 LAB — H. PYLORI BREATH TEST: H pylori Breath Test: POSITIVE — AB

## 2019-07-22 ENCOUNTER — Telehealth: Payer: Self-pay | Admitting: Gastroenterology

## 2019-07-22 ENCOUNTER — Telehealth: Payer: Self-pay

## 2019-07-22 DIAGNOSIS — Q400 Congenital hypertrophic pyloric stenosis: Secondary | ICD-10-CM

## 2019-07-22 MED ORDER — DOXYCYCLINE HYCLATE 100 MG PO CAPS: 100.0000 mg | ORAL_CAPSULE | Freq: Two times a day (BID) | ORAL | 0 refills | Status: AC

## 2019-07-22 MED ORDER — METRONIDAZOLE 250 MG PO TABS: 250.0000 mg | ORAL_TABLET | Freq: Four times a day (QID) | ORAL | 0 refills | Status: AC

## 2019-07-22 MED ORDER — OMEPRAZOLE 20 MG PO CPDR: 20.0000 mg | DELAYED_RELEASE_CAPSULE | Freq: Two times a day (BID) | ORAL | 0 refills | Status: DC

## 2019-07-22 MED ORDER — BISMUTH SUBSALICYLATE 262 MG PO CHEW: 524.0000 mg | CHEWABLE_TABLET | Freq: Three times a day (TID) | ORAL | 0 refills | Status: AC

## 2019-07-22 NOTE — Telephone Encounter (Signed)
Called and left a message for call back  

## 2019-07-22 NOTE — Addendum Note (Signed)
Addended by: [name] on: 07/22/2019 12:04 PM   Modules accepted: Orders

## 2019-07-22 NOTE — Telephone Encounter (Signed)
-----   Message from Virgel Manifold, MD sent at 07/22/2019 12:06 PM EST ----- Caryl Pina please let the patient know, her H. pylori test was positive.  I have sent prescriptions over to her pharmacy.  Please order another breath test that she should do 4 weeks after completing her course

## 2019-07-22 NOTE — Addendum Note (Signed)
Addended by: Ulyess Blossom L on: 07/22/2019 03:41 PM   Modules accepted: Orders

## 2019-07-22 NOTE — Telephone Encounter (Signed)
Pt left vm returning a call for [employer]

## 2019-07-22 NOTE — Telephone Encounter (Signed)
Patient verbalized understanding of lab results. Patient states she will pick up medication and get repeat labs in 6 weeks. Patient needs a letter for her work that she had a telephone visit on 07/09/2019. Patient would like it sent to mychart. Did letter for patient

## 2019-07-22 NOTE — Telephone Encounter (Signed)
I have already talk to patient

## 2019-07-29 ENCOUNTER — Encounter: Payer: Self-pay | Admitting: Family Medicine

## 2019-07-30 ENCOUNTER — Telehealth: Payer: BC Managed Care – PPO | Admitting: Physician Assistant

## 2019-07-30 DIAGNOSIS — Z20822 Contact with and (suspected) exposure to covid-19: Secondary | ICD-10-CM | POA: Diagnosis not present

## 2019-07-30 NOTE — Progress Notes (Signed)
E-Visit for Corona Virus Screening   Your current symptoms could be consistent with the coronavirus.  Many health care providers can now test patients at their office but not all are.  [facility] has multiple testing sites. For information on our [place] testing locations and hours go to [email or web address]  We are enrolling you in our [place] for [place] . Daily you will receive a questionnaire within the [place] website. Our COVID 19 response team will be monitoring your responses daily.  Testing Information: The COVID-19 Community Testing sites will begin testing BY APPOINTMENT ONLY.  You can schedule online at [email or web address]  If you do not have access to a smart phone or computer you may call [phone] for an appointment.  Testing Locations: Appointment schedule is 8 am to 3:30 pm at all sites  [facility] indoors at [address], [place] [address] [facility]  indoors at [place]. [address], [place], [place] [address] [place] indoors at [address], [place] [address]  Additional testing sites in the Community:  . For [facility] Testing sites in [facility]  FaceUpdate.uy  . For Pop-up testing sites in [place]  BowlDirectory.co.uk  . For Testing sites with regular hours https://onsms.org/[place]/  . For [place] [email or web address].si  . For [facility] BasicJet.ca  . For [facility] testing in [place] and [employer] BasicJet.[email or web address]  . For [facility] testing in [facility]   https://lhi.care/covidtesting  For  more  information about community testing call [phone]   We are enrolling you in our [place] for [place] . Daily you will receive a questionnaire within the [place] website. Our COVID 19 response team will be monitoring your responses daily.  Please quarantine yourself while awaiting your test results. If you develop fever/cough/breathlessness, please stay home for 10 days with improving symptoms and until you have had 24 hours of no fever (without taking a fever reducer).  You should wear a mask or cloth face covering over your nose and mouth if you must be around other people or animals, including pets (even at home). Try to stay at least 6 feet away from other people. This will protect the people around you.  Please continue good preventive care measures, including:  frequent hand-washing, avoid touching your face, cover coughs/sneezes, stay out of crowds and keep a 6 foot distance from others.  COVID-19 is a respiratory illness with symptoms that are similar to the flu. Symptoms are typically mild to moderate, but there have been cases of severe illness and death due to the virus.   The following symptoms may appear 2-14 days after exposure: . Fever . Cough . Shortness of breath or difficulty breathing . Chills . Repeated shaking with chills . Muscle pain . Headache . Sore throat . New loss of taste or smell . Fatigue . Congestion or runny nose . Nausea or vomiting . Diarrhea  Go to the nearest hospital ED for assessment if fever/cough/breathlessness are severe or illness seems like a threat to life.  It is vitally important that if you feel that you have an infection such as this virus or any other virus that you stay home and away from places where you may spread it to others.  You should avoid contact with people age 61 and older.   You can use medication such as over the counter mucinex for runny nose  You may also take acetaminophen (Tylenol) as needed for  fever.  Reduce your risk of any infection by using the same precautions used for avoiding the common cold or flu:  [place] Kitchen Wash your hands often with soap and warm water for at least 20 seconds.  If soap and water are not readily available, use an alcohol-based hand sanitizer with at least 60% alcohol.  . If coughing or sneezing, cover your mouth and nose by coughing or sneezing into the elbow areas of your shirt or coat, into a tissue or into your sleeve (not your hands). . Avoid shaking hands with others and consider head nods or verbal greetings only. . Avoid touching your eyes, nose, or mouth with unwashed hands.  . Avoid close contact with people who are sick. . Avoid places or events with large numbers of people in one location, like concerts or sporting events. . Carefully consider travel plans you have or are making. . If you are planning any travel outside or inside the [place], visit the CDC's Travelers' Health webpage for the latest health notices. . If you have some symptoms but not all symptoms, continue to monitor at home and seek medical attention if your symptoms worsen. . If you are having a medical emergency, call 911.  HOME CARE . Only take medications as instructed by your medical team. . Drink plenty of fluids and get plenty of rest. . A steam or ultrasonic humidifier can help if you have congestion.   GET HELP RIGHT AWAY IF YOU HAVE EMERGENCY WARNING SIGNS** FOR COVID-19. If you or someone is showing any of these signs seek emergency medical care immediately. Call 911 or proceed to your closest emergency facility if: . You develop worsening high fever. . Trouble breathing . Bluish lips or face . Persistent pain or pressure in the chest . New confusion . Inability to wake or stay awake . You cough up blood. . Your symptoms become more severe  **This list is not all possible symptoms. Contact your medical provider for any symptoms that are sever or concerning to you.  MAKE  SURE YOU   Understand these instructions.  Will watch your condition.  Will get help right away if you are not doing well or get worse.  Your e-visit answers were reviewed by a board certified advanced clinical practitioner to complete your personal care plan.  Depending on the condition, your plan could have included both over the counter or prescription medications.  If there is a problem please reply once you have received a response from your provider.  Your safety is important to [place].  If you have drug allergies check your prescription carefully.    You can use MyChart to ask questions about today's visit, request a non-urgent call back, or ask for a work or school excuse for 24 hours related to this e-Visit. If it has been greater than 24 hours you will need to follow up with your provider, or enter a new e-Visit to address those concerns. You will get an e-mail in the next two days asking about your experience.  I hope that your e-visit has been valuable and will speed your recovery. Thank you for using e-visits.  5 minutes spent on this chart

## 2019-08-06 ENCOUNTER — Other Ambulatory Visit: Payer: Self-pay | Admitting: Family Medicine

## 2019-08-09 ENCOUNTER — Encounter: Payer: BC Managed Care – PPO | Admitting: Family Medicine

## 2019-08-27 ENCOUNTER — Encounter: Payer: BC Managed Care – PPO | Admitting: Family Medicine

## 2019-08-30 ENCOUNTER — Telehealth: Payer: Self-pay | Admitting: Family Medicine

## 2019-08-30 NOTE — Telephone Encounter (Signed)
Just to clarify were these absences authorized? See FMLA scanned in system

## 2019-08-30 NOTE — Telephone Encounter (Signed)
Pts employer needing to speak with provider about dates that Pt missed work. The employer has received the medical certification that stated her condition started on 10.15.2020 and it didn't state if Pt should have stayed out of work. Pt has missed days in October, November and December and they want to see if it was related / please advise and call [employer] [place] at [phone]

## 2019-09-02 ENCOUNTER — Other Ambulatory Visit: Payer: Self-pay

## 2019-09-02 DIAGNOSIS — Q400 Congenital hypertrophic pyloric stenosis: Secondary | ICD-10-CM

## 2019-09-02 NOTE — Telephone Encounter (Signed)
The days I excused her for are listed in the letters in her chart

## 2019-09-03 LAB — H. PYLORI BREATH TEST: H pylori Breath Test: NEGATIVE

## 2019-09-03 NOTE — Telephone Encounter (Signed)
Called and spoke with HR they state that patient was out of work 30 days from 05/03/19 to 07/18/19. She only has FMLA for doctors appts, HR states that if you approve these days, then she asked if you could send a letter stating that those days are approved and that she can be out on intermittent leave for up to 7 days at a time.

## 2019-09-06 NOTE — Telephone Encounter (Signed)
I had already told patient I was comfortable writing her FMLA forms for the days I had authorized her to be out which were the ones I wrote letters for. Anything beyond that will have to come from her GI specialist

## 2019-09-06 NOTE — Telephone Encounter (Signed)
Called pt to let her know of Rachel's message. Pt states that she spoke with GI and they do not do fmla paperwork

## 2019-09-10 NOTE — Telephone Encounter (Signed)
We can discuss further at her OV tomorrow

## 2019-09-10 NOTE — Telephone Encounter (Signed)
Called and left a message letting patient know this will be discussed at her appt.

## 2019-09-11 ENCOUNTER — Other Ambulatory Visit: Payer: Self-pay

## 2019-09-11 ENCOUNTER — Encounter: Payer: Self-pay | Admitting: Family Medicine

## 2019-09-11 ENCOUNTER — Ambulatory Visit (INDEPENDENT_AMBULATORY_CARE_PROVIDER_SITE_OTHER): Payer: BC Managed Care – PPO | Admitting: Family Medicine

## 2019-09-11 VITALS — BP 124/84 | HR 70 | Temp 98.2°F | Ht 63.0 in | Wt 188.1 lb

## 2019-09-11 DIAGNOSIS — R197 Diarrhea, unspecified: Secondary | ICD-10-CM

## 2019-09-11 DIAGNOSIS — R1084 Generalized abdominal pain: Secondary | ICD-10-CM | POA: Diagnosis not present

## 2019-09-11 DIAGNOSIS — F339 Major depressive disorder, recurrent, unspecified: Secondary | ICD-10-CM | POA: Diagnosis not present

## 2019-09-11 DIAGNOSIS — F5102 Adjustment insomnia: Secondary | ICD-10-CM

## 2019-09-11 MED ORDER — SERTRALINE HCL 100 MG PO TABS: 100.0000 mg | ORAL_TABLET | Freq: Every day | ORAL | 1 refills | Status: DC

## 2019-09-11 MED ORDER — ZOLPIDEM TARTRATE 5 MG PO TABS: 5.0000 mg | ORAL_TABLET | Freq: Every evening | ORAL | 0 refills | Status: DC | PRN

## 2019-09-11 NOTE — Assessment & Plan Note (Signed)
Restart zoloft, will continue working on figuring out her GI issues and how to improve those and improve sleep issues as well

## 2019-09-11 NOTE — Progress Notes (Signed)
BP 124/84   Pulse 70   Temp 98.2 F (36.8 C) (Oral)   Ht 5\' 3"  (1.6 m)   Wt 188 lb 2 oz (85.3 kg)   LMP 05/09/2014 (Approximate)   SpO2 95%   BMI 33.32 kg/m    Subjective:    Patient ID: Kendra Espinoza, female    DOB: 05/22/1961, 59 y.o.   MRN: [id number]  HPI: Kendra Espinoza is a 59 y.o. female  Chief Complaint  Patient presents with  . Abdominal Pain    x a few months, lately symptoms have gotten worse  . Insomnia  . Headache   Patient presenting for continued generalized variable abdominal pain, diarrhea, and intermittent N/V. Has been following with GI given persistence of sxs, underwent colonoscopy 05/2019 and numerous tests without obvious cause. Was treated for H pylori recently with prevpak and had negative retest last week. No noticeable improvement after treatment. These sxs have been massively impactful in her daily life. Missing work intermittently, having mood and sleep issues due to extent of pain and lack of obvious answers/treatments. No energy, no motivation, not sleeping well despite seroquel. Failed trazodone for sleep in the past. Has tried cymbalta, nortriptyline, zoloft most recently. Zoloft did seem to help some with moods.  Denies SI/HI. Takes 1/2 tablet because full tab makes her groggy.   Depression screen [facility] 2/9 06/28/2019 03/15/2019 01/02/2019  Decreased Interest 1 1 1   Down, Depressed, Hopeless 1 1 1   PHQ - 2 Score 2 2 2   Altered sleeping 2 3 2   Tired, decreased energy 2 3 2   Change in appetite 3 3 2   Feeling bad or failure about yourself  1 2 0  Trouble concentrating 2 3 2   Moving slowly or fidgety/restless 0 0 1  Suicidal thoughts 0 0 0  PHQ-9 Score 12 16 11    GAD 7 : Generalized Anxiety Score 06/28/2019 03/15/2019 07/31/2018 03/27/2018  Nervous, Anxious, on Edge 2 3 1 2   Control/stop worrying 2 2 1 2   Worry too much - different things 2 2 1 2   Trouble relaxing 2 2 0 2  Restless 1 2 0 1  Easily annoyed or irritable 1 2 1 1   Afraid - awful  might happen 0 0 1 0  Total GAD 7 Score 10 13 5 10   Anxiety Difficulty Somewhat difficult Somewhat difficult - -   Relevant past medical, surgical, family and social history reviewed and updated as indicated. Interim medical history since our last visit reviewed. Allergies and medications reviewed and updated.  Review of Systems  Per HPI unless specifically indicated above     Objective:    BP 124/84   Pulse 70   Temp 98.2 F (36.8 C) (Oral)   Ht 5\' 3"  (1.6 m)   Wt 188 lb 2 oz (85.3 kg)   LMP 05/09/2014 (Approximate)   SpO2 95%   BMI 33.32 kg/m   Wt Readings from Last 3 Encounters:  09/11/19 188 lb 2 oz (85.3 kg)  06/28/19 191 lb (86.6 kg)  06/11/19 188 lb 4.8 oz (85.4 kg)    Physical Exam Vitals and nursing note reviewed.  Constitutional:      Appearance: Normal appearance. She is not ill-appearing.  HENT:     Head: Atraumatic.  Eyes:     Extraocular Movements: Extraocular movements intact.     Conjunctiva/sclera: Conjunctivae normal.  Cardiovascular:     Rate and Rhythm: Normal rate and regular rhythm.     Heart sounds: Normal  heart sounds.  Pulmonary:     Effort: Pulmonary effort is normal.     Breath sounds: Normal breath sounds.  Abdominal:     General: Bowel sounds are normal. There is no distension.     Palpations: Abdomen is soft.     Tenderness: There is no abdominal tenderness.  Musculoskeletal:        General: Normal range of motion.     Cervical back: Normal range of motion and neck supple.  Skin:    General: Skin is warm and dry.  Neurological:     Mental Status: She is alert and oriented to person, place, and time.  Psychiatric:        Mood and Affect: Mood normal.        Thought Content: Thought content normal.        Judgment: Judgment normal.     Results for orders placed or performed in visit on 09/02/19  H. pylori breath test  Result Value Ref Range   H pylori Breath Test Negative Negative      Assessment & Plan:   Problem List  Items Addressed This Visit      Other   Insomnia    Seroquel not beneficial at low dose and intolerant to higher dose due to grogginess. Will trial ambien, if not having benefit will try to get belsomra covered for her      Depression, recurrent ([place])    Restart zoloft, will continue working on figuring out her GI issues and how to improve those and improve sleep issues as well      Relevant Medications   sertraline (ZOLOFT) 100 MG tablet    Other Visit Diagnoses    Diarrhea, unspecified type    -  Primary   Requesting second opinion from GI, new referral generated. Has failed numerous medications in the past so will hold off trying new meds at this time   Relevant Orders   Ambulatory referral to Gastroenterology   Generalized abdominal pain       Relevant Orders   Ambulatory referral to Gastroenterology       Follow up plan: Return in about 3 months (around 12/09/2019) for Mood, abdominal pain f/u.

## 2019-09-11 NOTE — Assessment & Plan Note (Signed)
Seroquel not beneficial at low dose and intolerant to higher dose due to grogginess. Will trial Lorrin Mais, if not having benefit will try to get belsomra covered for her

## 2019-09-13 ENCOUNTER — Telehealth: Payer: Self-pay | Admitting: Family Medicine

## 2019-09-13 NOTE — Telephone Encounter (Signed)
-----   Message from Stark Klein sent at 09/13/2019 10:14 AM EST ----- Regarding: Letter for [facility] called and would like a letter stating that you did or did not authorize 10/16-12/31 off of work. Once written I will fax it back to them.

## 2019-09-13 NOTE — Telephone Encounter (Signed)
I only authorized the dates I wrote letters on. I did not authorize the others. Please write letter to reflect that

## 2019-09-16 ENCOUNTER — Encounter: Payer: Self-pay | Admitting: *Deleted

## 2019-09-20 ENCOUNTER — Encounter: Payer: Self-pay | Admitting: Family Medicine

## 2019-09-26 ENCOUNTER — Encounter: Payer: Self-pay | Admitting: Gastroenterology

## 2019-09-26 ENCOUNTER — Other Ambulatory Visit: Payer: Self-pay

## 2019-09-26 ENCOUNTER — Ambulatory Visit (INDEPENDENT_AMBULATORY_CARE_PROVIDER_SITE_OTHER): Payer: BC Managed Care – PPO | Admitting: Gastroenterology

## 2019-09-26 DIAGNOSIS — R109 Unspecified abdominal pain: Secondary | ICD-10-CM

## 2019-09-26 DIAGNOSIS — R1013 Epigastric pain: Secondary | ICD-10-CM

## 2019-09-26 MED ORDER — PANTOPRAZOLE SODIUM 40 MG PO TBEC: 40.0000 mg | DELAYED_RELEASE_TABLET | Freq: Every day | ORAL | 1 refills | Status: DC

## 2019-09-26 MED ORDER — LOPERAMIDE HCL 2 MG PO TABS: 2.0000 mg | ORAL_TABLET | Freq: Three times a day (TID) | ORAL | 1 refills | Status: DC | PRN

## 2019-09-26 NOTE — Progress Notes (Signed)
[name], MD [address]  [place], [place] [address]  Main: [phone]  Fax: [phone]   Primary Care Physician: Volney American, PA-C  Virtual Visit via Video Note  I connected with patient on 09/26/19 at  2:15 PM EST by video (using [email or web address]) and verified that I am speaking with the correct person using two identifiers.   I discussed the limitations, risks, security and privacy concerns of performing an evaluation and management service by video and the availability of in person appointments. I also discussed with the patient that there may be a patient responsible charge related to this service. The patient expressed understanding and agreed to proceed.  Location of Patient: Home Location of Provider: Home Persons involved: Patient and provider only (Nursing staff checked in patient via phone but were not physically involved in the video interaction - see their notes)   History of Present Illness: Chief Complaint  Patient presents with  . Diarrhea    Patient states this has been going on since septemeber. She states it happens 2-3 times a week. Patient states when she has it all day. Patient has abdominal pain every day     HPI: Kendra Espinoza is a 59 y.o. female with midepigastric pain.  Previously found to be H. pylori positive and treated with antibiotics.  H. pylori breath test was subsequently negative.  However, patient continues to report pain.  No nausea or vomiting.  5/10, nonradiating, dull.  Unrelated to meals.  Constant.  No weight loss.  No prior EGD.  Is taking Protonix 20 mg once daily and Carafate.  This has led to some improvement but continues to have daily symptoms.  Also reports diarrhea.  However, states this is better as well.  Used to have daily diarrhea, now is only having it about 2-3 times a week.  On the days that she has it she reports 2-3 loose bowel movements.  No blood in stool.  Has had a colonoscopy for this  previously with no evidence of inflammation or microscopic colitis.  Current Outpatient Medications  Medication Sig Dispense Refill  . pantoprazole (PROTONIX) 20 MG tablet Take 20 mg by mouth daily.    . QUEtiapine (SEROQUEL) 50 MG tablet Take 50 mg by mouth daily. Patient taking 1/2 of pill at bed time (25 mg total)    . sertraline (ZOLOFT) 100 MG tablet Take 1 tablet (100 mg total) by mouth daily. 90 tablet 1  . SUMAtriptan (IMITREX) 100 MG tablet Take 1 tab at onset of migraine. May repeat in 2 hours if headache persists or recurs. 10 tablet 3  . topiramate (TOPAMAX) 25 MG tablet Take 1 tablet (25 mg total) by mouth 2 (two) times daily. 60 tablet 0   No current facility-administered medications for this visit.    Allergies as of 09/26/2019 - Review Complete 09/26/2019  Allergen Reaction Noted  . Codeine  08/02/2018    Review of Systems:    All systems reviewed and negative except where noted in HPI.   Observations/Objective:  Labs: CMP  No results found for: NA, K, CL, CO2, GLUCOSE, BUN, CREATININE, CALCIUM, PROT, ALBUMIN, AST, ALT, ALKPHOS, BILITOT, GFRNONAA, GFRAA Lab Results  Component Value Date   WBC 7.1 09/25/2018   HGB 14.4 09/25/2018   HCT 42.3 09/25/2018   MCV 89 09/25/2018   PLT 215 09/25/2018    Imaging Studies: No results found.  Assessment and Plan:   Kendra Espinoza is a 59  y.o. y/o female with midepigastric pain with history of H. pylori recently treated with breath test negative subsequently  Assessment and Plan: Patient continues to have epigastric pain despite treatment of H. pylori and eradication testing showing a negative test  EGD indicated for further evaluation.  However, we will first increase her PPI to see if it helps with her symptoms prior to the procedure  I have discussed alternative options, risks & benefits,  which include, but are not limited to, bleeding, infection, perforation,respiratory complication & drug reaction.  The patient  agrees with this plan & written consent will be obtained.    .(Risks of PPI use were discussed with patient including bone loss, C. Diff diarrhea, pneumonia, infections, CKD, electrolyte abnormalities.  Pt. Verbalizes understanding and chooses to continue the medication.)  Diarrhea is better but still has it about 2-3 times a week.  Is not using any antidiarrheals.  Asked to use Imodium, about 1-2 times a day on the days that the diarrhea is occurring.  celiac panel was negative  Some of her GI issues may also be related to depression or anxiety.  Patient is on Zoloft  If abdominal pain is not better on protonix 40 once daily (increased from 20 once daily), pt advised to let us know in 2-3 weeks and dose can be increased  Follow Up Instructions:    I discussed the assessment and treatment plan with the patient. The patient was provided an opportunity to ask questions and all were answered. The patient agreed with the plan and demonstrated an understanding of the instructions.   The patient was advised to call back or seek an in-person evaluation if the symptoms worsen or if the condition fails to improve as anticipated.  I provided 15 minutes of face-to-face time via video software during this encounter. Additional time was spent in reviewing patient's chart, placing orders etc.   Virgel Manifold, MD  Speech recognition software was used to dictate this note.

## 2019-10-29 ENCOUNTER — Other Ambulatory Visit: Payer: Self-pay | Admitting: Family Medicine

## 2019-10-29 NOTE — Telephone Encounter (Signed)
Patient last seen 09/11/19 and has appointment 12/13/19

## 2019-10-31 MED ORDER — TOPIRAMATE 25 MG PO TABS: 25.0000 mg | ORAL_TABLET | Freq: Two times a day (BID) | ORAL | 2 refills | Status: DC

## 2019-11-04 ENCOUNTER — Encounter: Payer: Self-pay | Admitting: Family Medicine

## 2019-11-05 ENCOUNTER — Other Ambulatory Visit: Payer: Self-pay

## 2019-11-05 ENCOUNTER — Telehealth (INDEPENDENT_AMBULATORY_CARE_PROVIDER_SITE_OTHER): Payer: BC Managed Care – PPO | Admitting: Family Medicine

## 2019-11-05 ENCOUNTER — Encounter: Payer: Self-pay | Admitting: Family Medicine

## 2019-11-05 VITALS — BP 150/84 | Temp 99.6°F | Wt 184.2 lb

## 2019-11-05 DIAGNOSIS — J029 Acute pharyngitis, unspecified: Secondary | ICD-10-CM | POA: Diagnosis not present

## 2019-11-05 MED ORDER — AMOXICILLIN-POT CLAVULANATE 875-125 MG PO TABS: 1.0000 | ORAL_TABLET | Freq: Two times a day (BID) | ORAL | 0 refills | Status: DC

## 2019-11-05 MED ORDER — LIDOCAINE VISCOUS HCL 2 % MT SOLN: 5.0000 mL | OROMUCOSAL | 0 refills | Status: DC | PRN

## 2019-11-05 NOTE — Progress Notes (Signed)
BP (!) 150/84   Temp 99.6 F (37.6 C) (Oral)   Wt 184 lb 3.2 oz (83.6 kg)   LMP 05/09/2014 (Approximate)   BMI 32.63 kg/m    Subjective:    Patient ID: Kendra Espinoza, female    DOB: 1960/12/31, 59 y.o.   MRN: [id number]  HPI: Kendra Espinoza is a 59 y.o. female  Chief Complaint  Patient presents with  . URI    pt states she has been having chills, fever, and sore throat on the left side. States COVID test was negative    . This visit was completed via MyChart due to the restrictions of the COVID-19 pandemic. All issues as above were discussed and addressed. Physical exam was done as above through visual confirmation on MyChart. If it was felt that the patient should be evaluated in the office, they were directed there. The patient verbally consented to this visit. . Location of the patient: home . Location of the provider: home . Those involved with this call:  . Provider: Merrie Roof, PA-C . CMA: Lesle Chris, [place] . Front Desk/Registration: Jill Side  . Time spent on call: 15 minutes with patient face to face via video conference. More than 50% of this time was spent in counseling and coordination of care. 5 minutes total spent in review of patient's record and preparation of their chart. I verified patient identity using two factors (patient name and date of birth). Patient consents verbally to being seen via telemedicine visit today.   Left sided sore throat, chills, fever the past several days. Had COVID test yesterday that came back negative. Denies cough, congestion, CP, SOB. Trying OTC pain relievers with mild temporary relief. No known sick contacts.   Relevant past medical, surgical, family and social history reviewed and updated as indicated. Interim medical history since our last visit reviewed. Allergies and medications reviewed and updated.  Review of Systems  Per HPI unless specifically indicated above     Objective:    BP (!) 150/84   Temp 99.6 F (37.6  C) (Oral)   Wt 184 lb 3.2 oz (83.6 kg)   LMP 05/09/2014 (Approximate)   BMI 32.63 kg/m   Wt Readings from Last 3 Encounters:  11/05/19 184 lb 3.2 oz (83.6 kg)  09/11/19 188 lb 2 oz (85.3 kg)  06/28/19 191 lb (86.6 kg)    Physical Exam Vitals and nursing note reviewed.  Constitutional:      General: She is not in acute distress.    Appearance: Normal appearance.  HENT:     Head: Atraumatic.     Right Ear: External ear normal.     Left Ear: External ear normal.     Nose: Nose normal. No congestion.     Mouth/Throat:     Mouth: Mucous membranes are moist.     Pharynx: Oropharynx is clear. Posterior oropharyngeal erythema present.  Eyes:     Extraocular Movements: Extraocular movements intact.     Conjunctiva/sclera: Conjunctivae normal.  Cardiovascular:     Comments: Unable to assess via virtual visit Pulmonary:     Effort: Pulmonary effort is normal. No respiratory distress.  Musculoskeletal:        General: Normal range of motion.     Cervical back: Normal range of motion.  Skin:    General: Skin is dry.     Findings: No erythema.  Neurological:     Mental Status: She is alert and oriented to person, place, and time.  Psychiatric:        Mood and Affect: Mood normal.        Thought Content: Thought content normal.        Judgment: Judgment normal.     Results for orders placed or performed in visit on 09/02/19  H. pylori breath test  Result Value Ref Range   H pylori Breath Test Negative Negative      Assessment & Plan:   Problem List Items Addressed This Visit    None    Visit Diagnoses    Pharyngitis, unspecified etiology    -  Primary   COVID neg, suspect strep pharyngitis vs other viral illness. Tx with abx, viscous lidocaine. Work note given. F/u if worsening or not improving       Follow up plan: Return if symptoms worsen or fail to improve.

## 2019-11-13 ENCOUNTER — Other Ambulatory Visit: Payer: Self-pay

## 2019-11-13 ENCOUNTER — Encounter: Payer: Self-pay | Admitting: Gastroenterology

## 2019-11-20 ENCOUNTER — Other Ambulatory Visit
Admission: RE | Admit: 2019-11-20 | Discharge: 2019-11-20 | Disposition: A | Discharge status: Home / Self Care | Payer: BC Managed Care – PPO | Source: Ambulatory Visit | Attending: Gastroenterology | Admitting: Gastroenterology

## 2019-11-20 ENCOUNTER — Other Ambulatory Visit: Payer: Self-pay

## 2019-11-20 DIAGNOSIS — Z20822 Contact with and (suspected) exposure to covid-19: Secondary | ICD-10-CM | POA: Diagnosis not present

## 2019-11-20 DIAGNOSIS — Z01812 Encounter for preprocedural laboratory examination: Secondary | ICD-10-CM | POA: Diagnosis not present

## 2019-11-20 LAB — SARS CORONAVIRUS 2 (TAT 6-24 HRS): SARS Coronavirus 2: NEGATIVE

## 2019-11-21 NOTE — Discharge Instructions (Signed)
General Anesthesia, Adult, Care After This sheet gives you information about how to care for yourself after your procedure. Your health care provider may also give you more specific instructions. If you have problems or questions, contact your health care provider. What can I expect after the procedure? After the procedure, the following side effects are common:  Pain or discomfort at the IV site.  Nausea.  Vomiting.  Sore throat.  Trouble concentrating.  Feeling cold or chills.  Weak or tired.  Sleepiness and fatigue.  Soreness and body aches. These side effects can affect parts of the body that were not involved in surgery. Follow these instructions at home:  For at least 24 hours after the procedure:  Have a responsible adult stay with you. It is important to have someone help care for you until you are awake and alert.  Rest as needed.  Do not: ? Participate in activities in which you could fall or become injured. ? Drive. ? Use heavy machinery. ? Drink alcohol. ? Take sleeping pills or medicines that cause drowsiness. ? Make important decisions or sign legal documents. ? Take care of children on your own. Eating and drinking  Follow any instructions from your health care provider about eating or drinking restrictions.  When you feel hungry, start by eating small amounts of foods that are soft and easy to digest (bland), such as toast. Gradually return to your regular diet.  Drink enough fluid to keep your urine pale yellow.  If you vomit, rehydrate by drinking water, juice, or clear broth. General instructions  If you have sleep apnea, surgery and certain medicines can increase your risk for breathing problems. Follow instructions from your health care provider about wearing your sleep device: ? Anytime you are sleeping, including during daytime naps. ? While taking prescription pain medicines, sleeping medicines, or medicines that make you drowsy.  Return to  your normal activities as told by your health care provider. Ask your health care provider what activities are safe for you.  Take over-the-counter and prescription medicines only as told by your health care provider.  If you smoke, do not smoke without supervision.  Keep all follow-up visits as told by your health care provider. This is important. Contact a health care provider if:  You have nausea or vomiting that does not get better with medicine.  You cannot eat or drink without vomiting.  You have pain that does not get better with medicine.  You are unable to pass urine.  You develop a skin rash.  You have a fever.  You have redness around your IV site that gets worse. Get help right away if:  You have difficulty breathing.  You have chest pain.  You have blood in your urine or stool, or you vomit blood. Summary  After the procedure, it is common to have a sore throat or nausea. It is also common to feel tired.  Have a responsible adult stay with you for the first 24 hours after general anesthesia. It is important to have someone help care for you until you are awake and alert.  When you feel hungry, start by eating small amounts of foods that are soft and easy to digest (bland), such as toast. Gradually return to your regular diet.  Drink enough fluid to keep your urine pale yellow.  Return to your normal activities as told by your health care provider. Ask your health care provider what activities are safe for you. This information is not   intended to replace advice given to you by your health care provider. Make sure you discuss any questions you have with your health care provider. Document Revised: 07/07/2017 Document Reviewed: 02/17/2017 Elsevier Patient Education  2020 [employer]  

## 2019-11-22 ENCOUNTER — Ambulatory Visit: Payer: BC Managed Care – PPO | Admitting: Anesthesiology

## 2019-11-22 ENCOUNTER — Other Ambulatory Visit: Payer: Self-pay

## 2019-11-22 ENCOUNTER — Encounter: Payer: Self-pay | Admitting: Gastroenterology

## 2019-11-22 ENCOUNTER — Encounter
Admission: RE | Disposition: A | Discharge status: Home / Self Care | Payer: Self-pay | Source: Home / Self Care | Attending: Gastroenterology

## 2019-11-22 ENCOUNTER — Ambulatory Visit
Admission: RE | Admit: 2019-11-22 | Discharge: 2019-11-22 | Disposition: A | Discharge status: Home / Self Care | Payer: BC Managed Care – PPO | Attending: Gastroenterology | Admitting: Gastroenterology

## 2019-11-22 DIAGNOSIS — Z885 Allergy status to narcotic agent status: Secondary | ICD-10-CM | POA: Insufficient documentation

## 2019-11-22 DIAGNOSIS — G43909 Migraine, unspecified, not intractable, without status migrainosus: Secondary | ICD-10-CM | POA: Insufficient documentation

## 2019-11-22 DIAGNOSIS — Z79899 Other long term (current) drug therapy: Secondary | ICD-10-CM | POA: Diagnosis not present

## 2019-11-22 DIAGNOSIS — K2970 Gastritis, unspecified, without bleeding: Secondary | ICD-10-CM | POA: Insufficient documentation

## 2019-11-22 DIAGNOSIS — Z8619 Personal history of other infectious and parasitic diseases: Secondary | ICD-10-CM

## 2019-11-22 DIAGNOSIS — K219 Gastro-esophageal reflux disease without esophagitis: Secondary | ICD-10-CM | POA: Diagnosis not present

## 2019-11-22 DIAGNOSIS — K228 Other specified diseases of esophagus: Secondary | ICD-10-CM | POA: Insufficient documentation

## 2019-11-22 DIAGNOSIS — Z6833 Body mass index (BMI) 33.0-33.9, adult: Secondary | ICD-10-CM | POA: Insufficient documentation

## 2019-11-22 DIAGNOSIS — E669 Obesity, unspecified: Secondary | ICD-10-CM | POA: Diagnosis not present

## 2019-11-22 DIAGNOSIS — G8929 Other chronic pain: Secondary | ICD-10-CM

## 2019-11-22 DIAGNOSIS — R1013 Epigastric pain: Secondary | ICD-10-CM | POA: Insufficient documentation

## 2019-11-22 HISTORY — PX: ESOPHAGOGASTRODUODENOSCOPY (EGD) WITH PROPOFOL: SHX5813

## 2019-11-22 SURGERY — ESOPHAGOGASTRODUODENOSCOPY (EGD) WITH PROPOFOL
Anesthesia: General | Site: Throat

## 2019-11-22 MED ORDER — LIDOCAINE HCL (CARDIAC) PF 100 MG/5ML IV SOSY
PREFILLED_SYRINGE | INTRAVENOUS | Status: DC | PRN
  Administered 2019-11-22: 40 mg via INTRAVENOUS

## 2019-11-22 MED ORDER — CHLORHEXIDINE GLUCONATE 0.12 % MT SOLN: 15.0000 mL | Freq: Once | OROMUCOSAL | Status: DC

## 2019-11-22 MED ORDER — PROPOFOL 10 MG/ML IV BOLUS
INTRAVENOUS | Status: DC | PRN
  Administered 2019-11-22 (×3): 50 mg via INTRAVENOUS

## 2019-11-22 MED ORDER — GLYCOPYRROLATE 0.2 MG/ML IJ SOLN
INTRAMUSCULAR | Status: DC | PRN
  Administered 2019-11-22: .2 mg via INTRAVENOUS

## 2019-11-22 MED ORDER — LACTATED RINGERS IV SOLN: INTRAVENOUS | Status: DC

## 2019-11-22 MED ORDER — ORAL CARE MOUTH RINSE: 15.0000 mL | Freq: Once | OROMUCOSAL | Status: DC

## 2019-11-22 SURGICAL SUPPLY — 6 items
BLOCK BITE 60FR ADLT L/F GRN (MISCELLANEOUS) ×2 IMPLANT
FORCEPS BIOP RAD 4 LRG CAP 4 (CUTTING FORCEPS) ×1 IMPLANT
GOWN CVR UNV OPN BCK APRN NK (MISCELLANEOUS) ×2 IMPLANT
GOWN ISOL THUMB LOOP REG UNIV (MISCELLANEOUS) ×2
KIT ENDO PROCEDURE OLY (KITS) ×2 IMPLANT
WATER STERILE IRR 250ML POUR (IV SOLUTION) ×2 IMPLANT

## 2019-11-22 NOTE — Anesthesia Preprocedure Evaluation (Signed)
Anesthesia Evaluation  Patient identified by MRN, date of birth, ID band Patient awake    History of Anesthesia Complications Negative for: history of anesthetic complications  Airway Mallampati: III  TM Distance: >3 FB Neck ROM: Full    Dental no notable dental hx.    Pulmonary neg pulmonary ROS,    Pulmonary exam normal        Cardiovascular Exercise Tolerance: Good negative cardio ROS Normal cardiovascular exam     Neuro/Psych  Headaches,    GI/Hepatic Neg liver ROS, GERD  Medicated,  Endo/Other  Obesity (BMI 33)  Renal/GU      Musculoskeletal   Abdominal   Peds  Hematology negative hematology ROS (+)   Anesthesia Other Findings   Reproductive/Obstetrics                             Anesthesia Physical Anesthesia Plan  ASA: II  Anesthesia Plan: General   Post-op Pain Management:    Induction: Intravenous  PONV Risk Score and Plan: 3 and TIVA and Propofol infusion  Airway Management Planned: Nasal Cannula and Natural Airway  Additional Equipment: None  Intra-op Plan:   Post-operative Plan:   Informed Consent: I have reviewed the patients History and Physical, chart, labs and discussed the procedure including the risks, benefits and alternatives for the proposed anesthesia with the patient or authorized representative who has indicated his/her understanding and acceptance.       Plan Discussed with: CRNA  Anesthesia Plan Comments:         Anesthesia Quick Evaluation

## 2019-11-22 NOTE — Anesthesia Postprocedure Evaluation (Signed)
Anesthesia Post Note  Patient: DICKSIE LALUZERNE  Procedure(s) Performed: ESOPHAGOGASTRODUODENOSCOPY (EGD) WITH PROPOFOL (N/A Throat)     Patient location during evaluation: PACU Anesthesia Type: General Level of consciousness: awake and alert Pain management: pain level controlled Vital Signs Assessment: post-procedure vital signs reviewed and stable Respiratory status: spontaneous breathing, nonlabored ventilation, respiratory function stable and patient connected to nasal cannula oxygen Cardiovascular status: blood pressure returned to baseline and stable Postop Assessment: no apparent nausea or vomiting Anesthetic complications: no    Adele Barthel Ayline Dingus

## 2019-11-22 NOTE — Op Note (Signed)
[facility] Patient Name: Kendra Espinoza Procedure Date: 11/22/2019 9:16 AM MRN: [id number] Account #: [id number] Date of Birth: 12/21/1960 Admit Type: Outpatient Age: 59 Room: [facility] OR ROOM 01 Gender: Female Note Status: Finalized Procedure:             Upper GI endoscopy Indications:           Epigastric abdominal pain, Follow-up of Helicobacter                         pylori Providers:             Caleigha Zale B. Bonna Gains MD, MD Referring MD:          Volney American (Referring MD) Medicines:             Monitored Anesthesia Care Complications:         No immediate complications. Procedure:             Pre-Anesthesia Assessment:                        - Prior to the procedure, a History and Physical was                         performed, and patient medications, allergies and                         sensitivities were reviewed. The patient's tolerance                         of previous anesthesia was reviewed.                        - The risks and benefits of the procedure and the                         sedation options and risks were discussed with the                         patient. All questions were answered and informed                         consent was obtained.                        - Patient identification and proposed procedure were                         verified prior to the procedure by the physician, the                         nurse, the anesthesiologist, the anesthetist and the                         technician. The procedure was verified in the                         procedure room.                        - ASA Grade Assessment: II - A patient  with mild                         systemic disease.                        After obtaining informed consent, the endoscope was                         passed under direct vision. Throughout the procedure,                         the patient's blood pressure, pulse, and oxygen                       saturations were monitored continuously. The Endoscope                         was introduced through the mouth, and advanced to the                         second part of duodenum. The upper GI endoscopy was                         accomplished with ease. The patient tolerated the                         procedure well. Findings:      The Z-line was irregular. Biopsies were taken with a cold forceps for       histology.      The examined esophagus was normal.      The entire examined stomach was normal. Biopsies were obtained in the       gastric body, at the incisura and in the gastric antrum with cold       forceps for histology. Biopsies were taken with a cold forceps for       Helicobacter pylori testing.      The duodenal bulb, second portion of the duodenum and examined duodenum       were normal. Biopsies for histology were taken with a cold forceps for       evaluation of celiac disease. Impression:            - Z-line irregular. Biopsied.                        - Normal esophagus.                        - Normal stomach. Biopsied.                        - Normal duodenal bulb, second portion of the duodenum                         and examined duodenum. Biopsied.                        - Biopsies were obtained in the gastric body, at the                         incisura and in the  gastric antrum. Recommendation:        - Await pathology results.                        - Discharge patient to home (with escort).                        - Advance diet as tolerated.                        - Continue present medications.                        - Patient has a contact number available for                         emergencies. The signs and symptoms of potential                         delayed complications were discussed with the patient.                         Return to normal activities tomorrow. Written                         discharge instructions were  provided to the patient.                        - Discharge patient to home (with escort).                        - The findings and recommendations were discussed with                         the patient.                        - The findings and recommendations were discussed with                         the patient's family. Procedure Code(s):     --- Professional ---                        [phone], Esophagogastroduodenoscopy, flexible,                         transoral; with biopsy, single or multiple Diagnosis Code(s):     --- Professional ---                        K22.8, Other specified diseases of esophagus                        R10.13, Epigastric pain                        [id number], Helicobacter pylori [H. pylori] as the cause                         of diseases classified elsewhere CPT copyright 2019 American Medical Association. All rights reserved. The codes documented in  this report are preliminary and upon coder review may  be revised to meet current compliance requirements.  [name], MD Margretta Sidle B. Bonna Gains MD, MD 11/22/2019 9:34:20 AM This report has been signed electronically. Number of Addenda: 0 Note Initiated On: 11/22/2019 9:16 AM Estimated Blood Loss:  Estimated blood loss: none.      [facility]

## 2019-11-22 NOTE — Transfer of Care (Signed)
Immediate Anesthesia Transfer of Care Note  Patient: Kendra Espinoza  Procedure(s) Performed: ESOPHAGOGASTRODUODENOSCOPY (EGD) WITH PROPOFOL (N/A )  Patient Location: PACU  Anesthesia Type: General  Level of Consciousness: awake, alert  and patient cooperative  Airway and Oxygen Therapy: Patient Spontanous Breathing and Patient connected to supplemental oxygen  Post-op Assessment: Post-op Vital signs reviewed, Patient's Cardiovascular Status Stable, Respiratory Function Stable, Patent Airway and No signs of Nausea or vomiting  Post-op Vital Signs: Reviewed and stable  Complications: No apparent anesthesia complications

## 2019-11-22 NOTE — H&P (Signed)
Kendra Antigua, MD [address] [place], [place], [place], [address] [address], [place], [place], [place], [address] Phone: [phone]  Fax: [phone]  Primary Care Physician:  Volney American, PA-C   Pre-Procedure History & Physical: HPI:  Kendra Espinoza is a 59 y.o. female is here for an EGD.   Past Medical History:  Diagnosis Date  . Migraines    about 1x/month since starting meds    Past Surgical History:  Procedure Laterality Date  . CESAREAN SECTION    . COLONOSCOPY WITH PROPOFOL N/A 06/11/2019   Procedure: COLONOSCOPY WITH BIOPSIES;  Surgeon: Virgel Manifold, MD;  Location: [facility];  Service: Endoscopy;  Laterality: N/A;  . POLYPECTOMY N/A 06/11/2019   Procedure: POLYPECTOMY;  Surgeon: Virgel Manifold, MD;  Location: [facility];  Service: Endoscopy;  Laterality: N/A;  . skin cancer removal    . TONSILLECTOMY      Prior to Admission medications   Medication Sig Start Date End Date Taking? Authorizing Provider  pantoprazole (PROTONIX) 40 MG tablet Take 1 tablet (40 mg total) by mouth daily. 09/26/19  Yes Virgel Manifold, MD  QUEtiapine (SEROQUEL) 50 MG tablet Take 50 mg by mouth daily. Patient taking 1/2 of pill at bed time (25 mg total)   Yes [provider]  SUMAtriptan (IMITREX) 100 MG tablet Take 1 tab at onset of migraine. May repeat in 2 hours if headache persists or recurs. 06/28/19  Yes Volney American, PA-C  topiramate (TOPAMAX) 25 MG tablet Take 1 tablet (25 mg total) by mouth 2 (two) times daily. 10/31/19  Yes Volney American, PA-C  lidocaine (XYLOCAINE) 2 % solution Use as directed 5 mLs in the mouth or throat as needed for mouth pain. Patient not taking: Reported on 11/13/2019 11/05/19   Volney American, PA-C  sertraline (ZOLOFT) 100 MG tablet Take 1 tablet (100 mg total) by mouth daily. Patient not taking: Reported on 11/13/2019 09/11/19   Volney American, PA-C     Allergies as of 09/26/2019 - Review Complete 09/26/2019  Allergen Reaction Noted  . Codeine  08/02/2018    Family History  Problem Relation Age of Onset  . Heart disease Mother   . Hypertension Mother   . Stroke Mother   . Heart attack Father 88  . Heart disease Father   . Hypertension Father   . Drug abuse Sister   . Hodgkin's lymphoma Brother   . Heart attack Maternal Grandmother   . Heart attack Maternal Grandfather   . Heart attack Paternal Grandfather     Social History   Socioeconomic History  . Marital status: Married    Spouse name: Not on file  . Number of children: Not on file  . Years of education: Not on file  . Highest education level: Not on file  Occupational History  . Not on file  Tobacco Use  . Smoking status: Never Smoker  . Smokeless tobacco: Never Used  Substance and Sexual Activity  . Alcohol use: Not Currently    Comment: occasionally  . Drug use: No  . Sexual activity: Yes  Other Topics Concern  . Not on file  Social History Narrative  . Not on file   Social Determinants of Health   Financial Resource Strain:   . Difficulty of Paying Living Expenses:   Food Insecurity:   . Worried About Charity fundraiser in the Last Year:   . [place] in the  Last Year:   Transportation Needs:   . Film/video editor (Medical):   Marland Kitchen Lack of Transportation (Non-Medical):   Physical Activity:   . Days of Exercise per Week:   . Minutes of Exercise per Session:   Stress:   . Feeling of Stress :   Social Connections:   . Frequency of Communication with Friends and Family:   . Frequency of Social Gatherings with Friends and Family:   . Attends Religious Services:   . Active Member of Clubs or Organizations:   . Attends Archivist Meetings:   Marland Kitchen Marital Status:   Intimate Partner Violence:   . Fear of Current or Ex-Partner:   . Emotionally Abused:   Marland Kitchen Physically Abused:   . Sexually Abused:     Review of Systems: See  HPI, otherwise negative ROS  Physical Exam: BP (!) 142/86   Pulse 73   Temp 97.6 F (36.4 C) (Temporal)   Resp 16   Ht 5\' 3"  (1.6 m)   Wt 84.8 kg   LMP 05/09/2014 (Approximate)   SpO2 100%   BMI 33.13 kg/m  General:   Alert,  pleasant and cooperative in NAD Head:  Normocephalic and atraumatic. Neck:  Supple; no masses or thyromegaly. Lungs:  Clear throughout to auscultation, normal respiratory effort.    Heart:  +S1, +S2, Regular rate and rhythm, No edema. Abdomen:  Soft, nontender and nondistended. Normal bowel sounds, without guarding, and without rebound.   Neurologic:  Alert and  oriented x4;  grossly normal neurologically.  Impression/Plan: Kendra Espinoza is here for an EGD for abdominal pain.  Risks, benefits, limitations, and alternatives regarding the procedure have been reviewed with the patient.  Questions have been answered.  All parties agreeable.   Virgel Manifold, MD  11/22/2019, 9:11 AM

## 2019-11-25 ENCOUNTER — Encounter: Payer: Self-pay | Admitting: Gastroenterology

## 2019-11-26 ENCOUNTER — Other Ambulatory Visit: Payer: Self-pay | Admitting: Gastroenterology

## 2019-11-26 MED ORDER — DICYCLOMINE HCL 10 MG PO CAPS: 10.0000 mg | ORAL_CAPSULE | Freq: Three times a day (TID) | ORAL | 0 refills | Status: DC | PRN

## 2019-11-27 ENCOUNTER — Ambulatory Visit: Payer: BC Managed Care – PPO | Admitting: Gastroenterology

## 2019-12-03 MED ORDER — IOHEXOL 350 MG/ML SOLN
100.00 | INTRAVENOUS | Status: DC
Start: ? — End: 2019-12-03

## 2019-12-04 ENCOUNTER — Encounter: Payer: Self-pay | Admitting: Family Medicine

## 2019-12-04 ENCOUNTER — Other Ambulatory Visit: Payer: Self-pay | Admitting: Family Medicine

## 2019-12-04 ENCOUNTER — Ambulatory Visit (INDEPENDENT_AMBULATORY_CARE_PROVIDER_SITE_OTHER): Payer: BC Managed Care – PPO | Admitting: Family Medicine

## 2019-12-04 ENCOUNTER — Other Ambulatory Visit: Payer: Self-pay

## 2019-12-04 VITALS — BP 123/77 | HR 66 | Temp 98.4°F | Wt 187.0 lb

## 2019-12-04 DIAGNOSIS — R197 Diarrhea, unspecified: Secondary | ICD-10-CM | POA: Diagnosis not present

## 2019-12-04 DIAGNOSIS — R1012 Left upper quadrant pain: Secondary | ICD-10-CM | POA: Diagnosis not present

## 2019-12-04 NOTE — Progress Notes (Signed)
BP 123/77   Pulse 66   Temp 98.4 F (36.9 C) (Oral)   Wt 187 lb 0.2 oz (84.8 kg)   LMP 05/09/2014 (Approximate)   SpO2 96%   BMI 33.13 kg/m    Subjective:    Patient ID: Kendra Espinoza, female    DOB: 1960/12/15, 59 y.o.   MRN: [id number]  HPI: Kendra Espinoza is a 59 y.o. female  Chief Complaint  Patient presents with  . Abdominal Pain    left lower abdomen. pt states went to the ED yesterday, given bentyl  . Urinary Frequency   Periumbilical pain now in LLQ the past 3 days, went to ED yesterday for further evaluation. Labs, including U/A, and CT abdomen pelvis all benign and unrevealing yesterday in ER. D/c'd with bentyl which she started taking but notes it makes her feel loopy. Feeling some better this morning. Of note, followed by GI for chronic lower abdominal pain and diarrhea and so far full workup including endoscopy unrevealing. Pt requesting second opinion to have fresh eyes look into her case as she's very frustrated by these chronic sxs and how much they impact her daily life. Has missed several days of work this episode, and tends to miss with most of her pain episodes. Denies fever, chills, N/V, melena.   Relevant past medical, surgical, family and social history reviewed and updated as indicated. Interim medical history since our last visit reviewed. Allergies and medications reviewed and updated.  Review of Systems  Per HPI unless specifically indicated above     Objective:    BP 123/77   Pulse 66   Temp 98.4 F (36.9 C) (Oral)   Wt 187 lb 0.2 oz (84.8 kg)   LMP 05/09/2014 (Approximate)   SpO2 96%   BMI 33.13 kg/m   Wt Readings from Last 3 Encounters:  12/04/19 187 lb 0.2 oz (84.8 kg)  11/22/19 187 lb (84.8 kg)  11/05/19 184 lb 3.2 oz (83.6 kg)    Physical Exam Vitals and nursing note reviewed.  Constitutional:      Appearance: Normal appearance. She is not ill-appearing.  HENT:     Head: Atraumatic.  Eyes:     Extraocular Movements:  Extraocular movements intact.     Conjunctiva/sclera: Conjunctivae normal.  Cardiovascular:     Rate and Rhythm: Normal rate and regular rhythm.     Heart sounds: Normal heart sounds.  Pulmonary:     Effort: Pulmonary effort is normal.     Breath sounds: Normal breath sounds.  Abdominal:     General: Bowel sounds are normal. There is no distension.     Palpations: Abdomen is soft.     Tenderness: There is no abdominal tenderness. There is no guarding.  Musculoskeletal:        General: Normal range of motion.     Cervical back: Normal range of motion and neck supple.  Skin:    General: Skin is warm and dry.  Neurological:     Mental Status: She is alert and oriented to person, place, and time.  Psychiatric:        Mood and Affect: Mood normal.        Thought Content: Thought content normal.        Judgment: Judgment normal.     Results for orders placed or performed during the hospital encounter of 11/20/19  SARS CORONAVIRUS 2 (TAT 6-24 HRS) Nasopharyngeal Nasopharyngeal Swab   Specimen: Nasopharyngeal Swab  Result Value Ref Range  SARS Coronavirus 2 NEGATIVE NEGATIVE      Assessment & Plan:   Problem List Items Addressed This Visit    None    Visit Diagnoses    Diarrhea, unspecified type    -  Primary   Relevant Orders   Ambulatory referral to Gastroenterology   LUQ pain       Relevant Orders   Ambulatory referral to Gastroenterology      Referral generated for 2nd opinion with GI, await this consultation. May continue prn use of bentyl, BRAT diet, imodium prn. F/u if sxs worsening in meantime  Follow up plan: Return if symptoms worsen or fail to improve.

## 2019-12-09 ENCOUNTER — Encounter: Payer: Self-pay | Admitting: Family Medicine

## 2019-12-13 ENCOUNTER — Ambulatory Visit: Payer: BC Managed Care – PPO | Admitting: Family Medicine

## 2019-12-20 ENCOUNTER — Ambulatory Visit: Payer: BC Managed Care – PPO | Admitting: Family Medicine

## 2019-12-23 ENCOUNTER — Encounter: Payer: Self-pay | Admitting: Family Medicine

## 2019-12-23 MED ORDER — QUETIAPINE FUMARATE 50 MG PO TABS: 50.0000 mg | ORAL_TABLET | Freq: Every day | ORAL | 1 refills | Status: DC

## 2019-12-27 ENCOUNTER — Telehealth: Payer: Self-pay | Admitting: Family Medicine

## 2019-12-27 NOTE — Telephone Encounter (Signed)
Called and spoke with patient.  Explained we would need an appointment to discuss FMLA and what she needs for this to be filled out. Last saw Apolonio Schneiders on 12/01/2019-but there isn't anything mentioned by the provider regarding FMLA. We would need a current appointment/follow-up in regards to her diarrhea and what she needs.  Also mentioned that Apolonio Schneiders may require Gertie Fey to do her FMLA since she is following them regarding her symptoms. Patient stated that she asked and she was told they don't do FMLA.   Patient has appointment with Gertie Fey 06/16 at 1:30. Scheduled appointment with Apolonio Schneiders the same day at 3:40 so Apolonio Schneiders can also reference what GI is stating.  Routing to provider as Juluis Rainier.

## 2019-12-27 NOTE — Telephone Encounter (Signed)
Yes, this is correct.  Patient would need appointment.

## 2019-12-27 NOTE — Telephone Encounter (Signed)
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

## 2019-12-27 NOTE — Telephone Encounter (Signed)
Pt came in to drop off paper work for [employer].Pt stated she did not want to make an apt and declined apt offered and Pt wanted Provider to just sign them, Please advise paper was put in bin.

## 2020-01-01 ENCOUNTER — Ambulatory Visit (INDEPENDENT_AMBULATORY_CARE_PROVIDER_SITE_OTHER): Payer: BC Managed Care – PPO | Admitting: Gastroenterology

## 2020-01-01 ENCOUNTER — Ambulatory Visit (INDEPENDENT_AMBULATORY_CARE_PROVIDER_SITE_OTHER): Payer: BC Managed Care – PPO | Admitting: Family Medicine

## 2020-01-01 ENCOUNTER — Encounter: Payer: Self-pay | Admitting: Family Medicine

## 2020-01-01 ENCOUNTER — Other Ambulatory Visit: Payer: Self-pay

## 2020-01-01 ENCOUNTER — Encounter: Payer: Self-pay | Admitting: Gastroenterology

## 2020-01-01 VITALS — BP 144/85 | HR 62 | Temp 97.7°F | Wt 191.8 lb

## 2020-01-01 DIAGNOSIS — R197 Diarrhea, unspecified: Secondary | ICD-10-CM | POA: Diagnosis not present

## 2020-01-01 DIAGNOSIS — K529 Noninfective gastroenteritis and colitis, unspecified: Secondary | ICD-10-CM

## 2020-01-01 DIAGNOSIS — R1084 Generalized abdominal pain: Secondary | ICD-10-CM | POA: Diagnosis not present

## 2020-01-01 NOTE — Patient Instructions (Addendum)
Please take Metamucil daily.  Please go to the [facility] and pick up your CT contrast and they will give you instructions.   Please arrive 15 minutes before your appointment time and remember to be fasting 4 hours prior to your CT Scan.    Psyllium granules or powder for solution What is this medicine? PSYLLIUM (SIL i yum) is a bulk-forming fiber laxative. This medicine is used to treat constipation. Increasing fiber in the diet may also help lower cholesterol and promote heart health for some people. This medicine may be used for other purposes; ask your health care provider or pharmacist if you have questions. COMMON BRAND NAME(S): Fiber Therapy, GenFiber, Geri-Mucil, Hydrocil, Konsyl, Metamucil, Metamucil MultiHealth, Mucilin, Natural Fiber Therapy, Reguloid What should I tell my health care provider before I take this medicine? They need to know if you have any of these conditions:  blockage in your bowel  difficulty swallowing  inflammatory bowel disease  phenylketonuria  stomach or intestine problems  sudden change in bowel habits lasting more than 2 weeks  an unusual or allergic reaction to psyllium, other medicines, dyes, or preservatives  pregnant or trying or get pregnant  breast-feeding How should I use this medicine? Mix this medicine into a full glass (240 mL) of water or other cool drink. Take this medicine by mouth. Follow the directions on the package labeling, or take as directed by your health care professional. Take your medicine at regular intervals. Do not take your medicine more often than directed. Talk to your pediatrician regarding the use of this medicine in children. While this drug may be prescribed for children as young as 13 years old for selected conditions, precautions do apply. Overdosage: If you think you have taken too much of this medicine contact a poison control center or emergency room at once. NOTE: This medicine is only for you. Do not  share this medicine with others. What if I miss a dose? If you miss a dose, take it as soon as you can. If it is almost time for your next dose, take only that dose. Do not take double or extra doses. What may interact with this medicine? Interactions are not expected. Take this product at least 2 hours before or after other medicines. This list may not describe all possible interactions. Give your health care provider a list of all the medicines, herbs, non-prescription drugs, or dietary supplements you use. Also tell them if you smoke, drink alcohol, or use illegal drugs. Some items may interact with your medicine. What should I watch for while using this medicine? Check with your doctor or health care professional if your symptoms do not start to get better or if they get worse. Stop using this medicine and contact your doctor or health care professional if you have rectal bleeding or if you have to treat your constipation for more than 1 week. These could be signs of a more serious condition. Drink several glasses of water a day while you are taking this medicine. This will help to relieve constipation and prevent dehydration. What side effects may I notice from receiving this medicine? Side effects that you should report to your doctor or health care professional as soon as possible:  allergic reactions like skin rash, itching or hives, swelling of the face, lips, or tongue  breathing problems  chest pain  nausea, vomiting  rectal bleeding  trouble swallowing Side effects that usually do not require medical attention (report to your doctor or health  care professional if they continue or are bothersome):  bloating  gas  stomach cramps This list may not describe all possible side effects. Call your doctor for medical advice about side effects. You may report side effects to FDA at 1-800-FDA-1088. Where should I keep my medicine? Keep out of the reach of children. Store at room  temperature between 15 and 30 degrees C (59 and 86 degrees F). Protect from moisture. Throw away any unused medicine after the expiration date. NOTE: This sheet is a summary. It may not cover all possible information. If you have questions about this medicine, talk to your doctor, pharmacist, or health care provider.  2020 Elsevier/Gold Standard (2017-11-28 15:41:08)

## 2020-01-01 NOTE — Progress Notes (Signed)
   BP (!) 145/82   Pulse 67   Temp (!) 97.5 F (36.4 C) (Oral)   Wt 190 lb (86.2 kg)   LMP 05/09/2014 (Approximate)   SpO2 95%   BMI 33.66 kg/m    Subjective:    Patient ID: Kendra Espinoza, female    DOB: Mar 30, 1961, 59 y.o.   MRN: [id number]  HPI: Kendra Espinoza is a 59 y.o. female  Chief Complaint  Patient presents with  . Paperwork    FMLA   Here today wanting to discuss some FMLA forms that she's requesting to have completed regarding her ongoing intermittent issues with diarrhea and abdominal pain. This has been ongoing for well over a year now without clear cause. Averaging 1 day per week missing work for her intermittent flares of diarrhea and abdominal pain. Working closely with GI for this. Denies fever, chills, melena.   Relevant past medical, surgical, family and social history reviewed and updated as indicated. Interim medical history since our last visit reviewed. Allergies and medications reviewed and updated.  Review of Systems  Per HPI unless specifically indicated above     Objective:    BP (!) 145/82   Pulse 67   Temp (!) 97.5 F (36.4 C) (Oral)   Wt 190 lb (86.2 kg)   LMP 05/09/2014 (Approximate)   SpO2 95%   BMI 33.66 kg/m   Wt Readings from Last 3 Encounters:  01/01/20 190 lb (86.2 kg)  01/01/20 191 lb 12.8 oz (87 kg)  12/04/19 187 lb 0.2 oz (84.8 kg)    Physical Exam Vitals and nursing note reviewed.  Constitutional:      Appearance: Normal appearance. She is not ill-appearing.  HENT:     Head: Atraumatic.  Eyes:     Extraocular Movements: Extraocular movements intact.     Conjunctiva/sclera: Conjunctivae normal.  Cardiovascular:     Rate and Rhythm: Normal rate and regular rhythm.     Heart sounds: Normal heart sounds.  Pulmonary:     Effort: Pulmonary effort is normal.     Breath sounds: Normal breath sounds.  Musculoskeletal:        General: Normal range of motion.     Cervical back: Normal range of motion and neck supple.    Skin:    General: Skin is warm and dry.  Neurological:     Mental Status: She is alert and oriented to person, place, and time.  Psychiatric:        Mood and Affect: Mood normal.        Thought Content: Thought content normal.        Judgment: Judgment normal.     Results for orders placed or performed during the hospital encounter of 11/20/19  SARS CORONAVIRUS 2 (TAT 6-24 HRS) Nasopharyngeal Nasopharyngeal Swab   Specimen: Nasopharyngeal Swab  Result Value Ref Range   SARS Coronavirus 2 NEGATIVE NEGATIVE      Assessment & Plan:   Problem List Items Addressed This Visit      Digestive   Chronic diarrhea    FLMA forms completed for 1-2 days weekly as needed for flares of abdominal pain and diarrhea. Continue working with GI on this issue           Follow up plan: Return for as scheduled.

## 2020-01-02 ENCOUNTER — Telehealth: Payer: Self-pay

## 2020-01-02 NOTE — Progress Notes (Signed)
    Kendra Antigua, MD [address]  [place]  [place], [place] [address]  Main: [phone]  Fax: [phone]   Primary Care Physician: Volney American, [place]   Chief complaint: Diarrhea HPI: Kendra Espinoza is a 59 y.o. female who presents for follow-up of diarrhea.  Patient reports ongoing symptoms despite negative work-up so far including colonoscopy not showing microscopic colitis, duodenal biopsies not showing celiac disease.  No blood in stool.  Also reports bilateral lower quadrant abdominal pain, not related to meals, cramping, dull, 5/10.  Current Outpatient Medications  Medication Sig Dispense Refill  . QUEtiapine (SEROQUEL) 50 MG tablet Take 1 tablet (50 mg total) by mouth daily. Patient taking 1/2 of pill at bed time (25 mg total) 90 tablet 1  . topiramate (TOPAMAX) 25 MG tablet Take 1 tablet (25 mg total) by mouth 2 (two) times daily. 60 tablet 2   No current facility-administered medications for this visit.    Allergies as of 01/01/2020 - Review Complete 01/01/2020  Allergen Reaction Noted  . Codeine  08/02/2018    ROS:  General: Negative for anorexia, weight loss, fever, chills, fatigue, weakness. ENT: Negative for hoarseness, difficulty swallowing , nasal congestion. CV: Negative for chest pain, angina, palpitations, dyspnea on exertion, peripheral edema.  Respiratory: Negative for dyspnea at rest, dyspnea on exertion, cough, sputum, wheezing.  GI: See history of present illness. GU:  Negative for dysuria, hematuria, urinary incontinence, urinary frequency, nocturnal urination.  Endo: Negative for unusual weight change.    Physical Examination:   BP (!) 144/85   Pulse 62   Temp 97.7 F (36.5 C) (Oral)   Wt 191 lb 12.8 oz (87 kg)   LMP 05/09/2014 (Approximate)   BMI 33.98 kg/m   General: Well-nourished, well-developed in no acute distress.  Eyes: No icterus. Conjunctivae pink. Mouth: Oropharyngeal mucosa moist and pink , no lesions  erythema or exudate. Neck: Supple, Trachea midline Abdomen: Bowel sounds are normal, nontender, nondistended, no hepatosplenomegaly or masses, no abdominal bruits or hernia , no rebound or guarding.   Extremities: No lower extremity edema. No clubbing or deformities. Neuro: Alert and oriented x 3.  Grossly intact. Skin: Warm and dry, no jaundice.   Psych: Alert and cooperative, normal mood and affect.   Labs: CMP  No results found for: NA, K, CL, CO2, GLUCOSE, BUN, CREATININE, CALCIUM, PROT, ALBUMIN, AST, ALT, ALKPHOS, BILITOT, GFRNONAA, GFRAA Lab Results  Component Value Date   WBC 7.1 09/25/2018   HGB 14.4 09/25/2018   HCT 42.3 09/25/2018   MCV 89 09/25/2018   PLT 215 09/25/2018    Imaging Studies: No results found.  Assessment and Plan:   Kendra Espinoza is a 59 y.o. y/o female here for follow-up of diarrhea  Metamucil was recommended to help bulk stool  On days that she is to work or if her symptoms are bad, I discussed proper Imodium use as she has only used 1 pill on the day that she has loose bowel movement and this is why it is not working.  I have discussed that she should take 4 mg the first time that she takes Imodium and then subsequently 2 mg after that for when she has loose stools as long as at least 2 hours have passed.  I have asked her to not take any more than 3-4 Imodium a day as needed.   Obtain fecal pancreatic elastase  Obtain CT abdomen pelvis  Dr Kendra Espinoza

## 2020-01-02 NOTE — Telephone Encounter (Signed)
Called patient to let her know that her FMLA paperwork is ready for pick up. LVM foe her to return my call. (DPR reviewed)

## 2020-01-02 NOTE — Telephone Encounter (Signed)
Patient called in stating she is needing to have paperwork faxed to [phone]. Patient wanted to inform office she will be by there tomorrow to pick up paperwork as well, but she is needing them faxed. Please advise.

## 2020-01-02 NOTE — Telephone Encounter (Signed)
Paperwork faxed °

## 2020-01-06 DIAGNOSIS — K529 Noninfective gastroenteritis and colitis, unspecified: Secondary | ICD-10-CM | POA: Insufficient documentation

## 2020-01-06 NOTE — Assessment & Plan Note (Signed)
FLMA forms completed for 1-2 days weekly as needed for flares of abdominal pain and diarrhea. Continue working with GI on this issue

## 2020-01-07 ENCOUNTER — Other Ambulatory Visit: Payer: Self-pay

## 2020-01-07 ENCOUNTER — Telehealth: Payer: Self-pay

## 2020-01-07 NOTE — Telephone Encounter (Signed)
Called patient to let her know that due to her insurance company denying her CT Scan, we needed to cancel it. I told patient that this information was given to Dr. Bonna Gains and that once Dr. Bonna Gains recommends something else, then I will call her back. Patient understood.

## 2020-01-07 NOTE — Telephone Encounter (Signed)
Patient has CT scan scheduled for 01/09/2020. Insurance company denied the CT scan. They said the CT scan is not medically necessary

## 2020-01-09 ENCOUNTER — Telehealth: Payer: Self-pay

## 2020-01-09 ENCOUNTER — Ambulatory Visit: Payer: BC Managed Care – PPO

## 2020-01-09 NOTE — Telephone Encounter (Signed)
Dr. Bonna Gains wants to know if we could try again with her insurance by stating that it is due to r/o diverticulitis. Ginger will check and see if this could be possible. If not, then Dr. Bonna Gains will let us know.

## 2020-01-13 NOTE — Telephone Encounter (Signed)
Ginger, when you have a chance, can you please check and see if this could get approved with r/o diverticulitis. If not, then Dr. Bonna Gains will let us know what we could order instead of the CT. Thank you!

## 2020-01-14 ENCOUNTER — Other Ambulatory Visit: Payer: Self-pay

## 2020-01-15 NOTE — Telephone Encounter (Signed)
CT abd/pelvis with contrast has been approved. Authorization # [id number] Valid 01/15/20 - 07/12/20

## 2020-01-15 NOTE — Telephone Encounter (Signed)
Patient was contacted and had to leave her a detailed message letting her know that her CT Scan was scheduled for this Friday at the [facility] arrival at 12:30 PM and she had to be fasting 4 hours prior. Patient was also told to go and pick up her prep tomorrow and that they would explain how it needed to be taken.

## 2020-01-16 ENCOUNTER — Other Ambulatory Visit: Payer: Self-pay

## 2020-01-16 DIAGNOSIS — R109 Unspecified abdominal pain: Secondary | ICD-10-CM

## 2020-01-17 ENCOUNTER — Other Ambulatory Visit
Admission: RE | Admit: 2020-01-17 | Discharge: 2020-01-17 | Disposition: A | Discharge status: Home / Self Care | Payer: BC Managed Care – PPO | Source: Home / Self Care | Attending: Gastroenterology | Admitting: Gastroenterology

## 2020-01-17 ENCOUNTER — Other Ambulatory Visit: Payer: Self-pay

## 2020-01-17 ENCOUNTER — Ambulatory Visit
Admission: RE | Admit: 2020-01-17 | Discharge: 2020-01-17 | Disposition: A | Discharge status: Home / Self Care | Payer: BC Managed Care – PPO | Source: Ambulatory Visit | Attending: Gastroenterology | Admitting: Gastroenterology

## 2020-01-17 DIAGNOSIS — R1084 Generalized abdominal pain: Secondary | ICD-10-CM

## 2020-01-17 DIAGNOSIS — R109 Unspecified abdominal pain: Secondary | ICD-10-CM

## 2020-01-17 HISTORY — DX: Malignant (primary) neoplasm, unspecified: C80.1

## 2020-01-17 LAB — COMPREHENSIVE METABOLIC PANEL
ALT: 19 U/L (ref 0–44)
AST: 16 U/L (ref 15–41)
Albumin: 4.5 g/dL (ref 3.5–5.0)
Alkaline Phosphatase: 86 U/L (ref 38–126)
Anion gap: 7 (ref 5–15)
BUN: 10 mg/dL (ref 6–20)
CO2: 27 mmol/L (ref 22–32)
Calcium: 9.6 mg/dL (ref 8.9–10.3)
Chloride: 106 mmol/L (ref 98–111)
Creatinine, Ser: 0.91 mg/dL (ref 0.44–1.00)
GFR calc Af Amer: >60 mL/min (ref >60)
GFR calc non Af Amer: >60 mL/min (ref >60)
Glucose, Bld: 121 mg/dL — ABNORMAL HIGH (ref 70–99)
Potassium: 5.3 mmol/L — ABNORMAL HIGH (ref 3.5–5.1)
Sodium: 140 mmol/L (ref 135–145)
Total Bilirubin: 0.9 mg/dL (ref 0.3–1.2)
Total Protein: 7.4 g/dL (ref 6.5–8.1)

## 2020-01-17 MED ORDER — IOHEXOL 300 MG/ML  SOLN
100.0000 mL | Freq: Once | INTRAMUSCULAR | Status: AC | PRN
  Administered 2020-01-17: 100 mL via INTRAVENOUS

## 2020-01-17 NOTE — Addendum Note (Signed)
Addended by: Jana Hakim on: 01/17/2020 12:41 PM   Modules accepted: Orders

## 2020-02-12 LAB — PANCREATIC ELASTASE, FECAL: Pancreatic Elastase, Fecal: 453 ug Elast./g (ref >200)

## 2020-02-20 ENCOUNTER — Encounter: Payer: Self-pay | Admitting: Family Medicine

## 2020-02-20 ENCOUNTER — Ambulatory Visit (INDEPENDENT_AMBULATORY_CARE_PROVIDER_SITE_OTHER): Payer: BC Managed Care – PPO | Admitting: Family Medicine

## 2020-02-20 ENCOUNTER — Other Ambulatory Visit: Payer: Self-pay

## 2020-02-20 VITALS — BP 126/86 | HR 74 | Temp 98.1°F | Wt 186.2 lb

## 2020-02-20 DIAGNOSIS — K529 Noninfective gastroenteritis and colitis, unspecified: Secondary | ICD-10-CM | POA: Diagnosis not present

## 2020-02-20 NOTE — Progress Notes (Signed)
BP 126/86   Pulse 74   Temp 98.1 F (36.7 C) (Oral)   Wt 186 lb 4 oz (84.5 kg)   LMP 05/09/2014 (Approximate)   SpO2 98%   BMI 32.99 kg/m    Subjective:    Patient ID: Kendra Espinoza, female    DOB: Mar 19, 1961, 59 y.o.   MRN: [id number]  HPI: Kendra Espinoza is a 59 y.o. female  Chief Complaint  Patient presents with  . Abdominal Pain    on and off since last September/ pt requested work note for July 26,27,28 - 2021  . Diarrhea   Presenting today following up on her chronic diarrhea and abdominal cramping for over a year now. Following closely with GI, unfortunately so far without clear diagnosis of cause of her sxs. Has been missing significant amounts of work due to severity of sxs and frequency of diarrhea. 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 Requesting a note stating she would do best working individually for this reason and also a note to cover some days a week or so ago she missed due to her sxs. Following up with GI next week.   Relevant past medical, surgical, family and social history reviewed and updated as indicated. Interim medical history since our last visit reviewed. Allergies and medications reviewed and updated.  Review of Systems  Per HPI unless specifically indicated above     Objective:    BP 126/86   Pulse 74   Temp 98.1 F (36.7 C) (Oral)   Wt 186 lb 4 oz (84.5 kg)   LMP 05/09/2014 (Approximate)   SpO2 98%   BMI 32.99 kg/m   Wt Readings from Last 3 Encounters:  02/20/20 186 lb 4 oz (84.5 kg)  01/01/20 190 lb (86.2 kg)  01/01/20 191 lb 12.8 oz (87 kg)    Physical Exam Vitals and nursing note reviewed.  Constitutional:      Appearance: Normal appearance. She is not ill-appearing.  HENT:     Head: Atraumatic.  Eyes:     Extraocular Movements: Extraocular movements intact.      Conjunctiva/sclera: Conjunctivae normal.  Cardiovascular:     Rate and Rhythm: Normal rate and regular rhythm.     Heart sounds: Normal heart sounds.  Pulmonary:     Effort: Pulmonary effort is normal.     Breath sounds: Normal breath sounds.  Abdominal:     General: Bowel sounds are normal. There is no distension.     Palpations: Abdomen is soft.     Tenderness: There is no abdominal tenderness.  Musculoskeletal:        General: Normal range of motion.     Cervical back: Normal range of motion and neck supple.  Skin:    General: Skin is warm and dry.  Neurological:     Mental Status: She is alert and oriented to person, place, and time.  Psychiatric:        Mood and Affect: Mood normal.        Thought Content: Thought content normal.        Judgment: Judgment normal.     Results for orders placed or performed during the hospital encounter of 01/17/20  Comprehensive metabolic panel  Result Value Ref Range   Sodium 140 135 - 145 mmol/L   Potassium 5.3 (H) 3.5 - 5.1  mmol/L   Chloride 106 98 - 111 mmol/L   CO2 27 22 - 32 mmol/L   Glucose, Bld 121 (H) 70 - 99 mg/dL   BUN 10 6 - 20 mg/dL   Creatinine, Ser 0.91 0.44 - 1.00 mg/dL   Calcium 9.6 8.9 - 10.3 mg/dL   Total Protein 7.4 6.5 - 8.1 g/dL   Albumin 4.5 3.5 - 5.0 g/dL   AST 16 15 - 41 U/L   ALT 19 0 - 44 U/L   Alkaline Phosphatase 86 38 - 126 U/L   Total Bilirubin 0.9 0.3 - 1.2 mg/dL   GFR calc non Af Amer >60 >60 mL/min   GFR calc Af Amer >60 >60 mL/min   Anion gap 7 5 - 15      Assessment & Plan:   Problem List Items Addressed This Visit      Digestive   Chronic diarrhea - Primary    Continue following with GI, has second opinion visit scheduled for October additionally. Will provide notes requested, one for working individually and one to cover several days missed at the end of July. Continue to monitor sxs closely and current regimen per GI          Follow up plan: Return if symptoms worsen or fail to  improve.

## 2020-02-21 ENCOUNTER — Other Ambulatory Visit: Payer: Self-pay

## 2020-02-21 NOTE — Assessment & Plan Note (Signed)
Continue following with GI, has second opinion visit scheduled for October additionally. Will provide notes requested, one for working individually and one to cover several days missed at the end of July. Continue to monitor sxs closely and current regimen per GI

## 2020-02-24 ENCOUNTER — Encounter: Payer: Self-pay | Admitting: Gastroenterology

## 2020-02-24 ENCOUNTER — Telehealth (INDEPENDENT_AMBULATORY_CARE_PROVIDER_SITE_OTHER): Payer: BC Managed Care – PPO | Admitting: Gastroenterology

## 2020-02-24 ENCOUNTER — Other Ambulatory Visit: Payer: Self-pay

## 2020-02-24 DIAGNOSIS — R197 Diarrhea, unspecified: Secondary | ICD-10-CM

## 2020-02-24 MED ORDER — COLESTIPOL HCL 1 G PO TABS: 2.0000 g | ORAL_TABLET | Freq: Two times a day (BID) | ORAL | 1 refills | Status: DC

## 2020-02-24 NOTE — Progress Notes (Signed)
[name], MD [address]  [place]  [place], [place] [address]  Main: [phone]  Fax: [phone]   Primary Care Physician: Volney American, PA-C  Virtual Visit via Telephone Note  I connected with patient on 02/24/20 at  9:00 AM EDT by telephone and verified that I am speaking with the correct person using two identifiers.   I discussed the limitations, risks, security and privacy concerns of performing an evaluation and management service by telephone and the availability of in person appointments. I also discussed with the patient that there may be a patient responsible charge related to this service. The patient expressed understanding and agreed to proceed.  Location of Patient: Home Location of Provider: Home Persons involved: Patient and provider only during the visit (nursing staff and front desk staff was involved in communicating with the patient prior to the appointment, reviewing medications and checking them in)   History of Present Illness: Chief Complaint  Patient presents with  . Diarrhea     HPI: Kendra Espinoza is a 59 y.o. female here for follow-up of diarrhea.  Has tried taking loperamide but continues to have diarrhea.  Is not having too many frequent loose bowel movements, but reports 1-2 loose bowel movements a day.  Also lower abdominal pain.  Patient has had complete work-up including colonoscopy with biopsies and imaging which has been unrevealing.  Current Outpatient Medications  Medication Sig Dispense Refill  . pantoprazole (PROTONIX) 20 MG tablet Take 20 mg by mouth daily.    . QUEtiapine (SEROQUEL) 50 MG tablet Take 1 tablet (50 mg total) by mouth daily. Patient taking 1/2 of pill at bed time (25 mg total) 90 tablet 1  . topiramate (TOPAMAX) 25 MG tablet Take 1 tablet (25 mg total) by mouth 2 (two) times daily. 60 tablet 2  . colestipol (COLESTID) 1 g tablet Take 2 tablets (2 g total) by mouth 2 (two) times daily. 60 tablet  1   No current facility-administered medications for this visit.    Allergies as of 02/24/2020 - Review Complete 02/24/2020  Allergen Reaction Noted  . Codeine  08/02/2018    Review of Systems:    All systems reviewed and negative except where noted in HPI.   Observations/Objective:  Labs: CMP     Component Value Date/Time   NA 140 01/17/2020 1244   K 5.3 (H) 01/17/2020 1244   CL 106 01/17/2020 1244   CO2 27 01/17/2020 1244   GLUCOSE 121 (H) 01/17/2020 1244   BUN 10 01/17/2020 1244   CREATININE 0.91 01/17/2020 1244   CALCIUM 9.6 01/17/2020 1244   PROT 7.4 01/17/2020 1244   ALBUMIN 4.5 01/17/2020 1244   AST 16 01/17/2020 1244   ALT 19 01/17/2020 1244   ALKPHOS 86 01/17/2020 1244   BILITOT 0.9 01/17/2020 1244   GFRNONAA >60 01/17/2020 1244   GFRAA >60 01/17/2020 1244   Lab Results  Component Value Date   WBC 7.1 09/25/2018   HGB 14.4 09/25/2018   HCT 42.3 09/25/2018   MCV 89 09/25/2018   PLT 215 09/25/2018    Imaging Studies: No results found.  Assessment and Plan:   Kendra Espinoza is a 59 y.o. 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  Obtain SIBO testing  Follow Up Instructions:    I discussed the assessment and treatment plan with the patient. The patient was provided an opportunity to ask questions and all were answered. The patient agreed with the plan and demonstrated an understanding of the instructions.   The patient was advised to call back or seek an in-person evaluation if the symptoms worsen or if the condition fails to improve as anticipated.  I provided 12 minutes of  non-face-to-face time during this encounter. Additional time was spent in reviewing patient's chart, placing orders etc.   Virgel Manifold, MD  Speech recognition software was used to dictate this note.

## 2020-02-25 ENCOUNTER — Telehealth: Payer: Self-pay

## 2020-02-25 NOTE — Telephone Encounter (Signed)
The small bowel bacteria test had been faxed. They will contact the patient.

## 2020-03-02 ENCOUNTER — Encounter: Payer: Self-pay | Admitting: Family Medicine

## 2020-03-10 ENCOUNTER — Other Ambulatory Visit: Payer: Self-pay

## 2020-03-10 ENCOUNTER — Encounter: Payer: Self-pay | Admitting: Family Medicine

## 2020-03-10 ENCOUNTER — Ambulatory Visit (INDEPENDENT_AMBULATORY_CARE_PROVIDER_SITE_OTHER): Payer: BC Managed Care – PPO | Admitting: Family Medicine

## 2020-03-10 VITALS — BP 143/88 | HR 64 | Temp 98.2°F | Wt 185.6 lb

## 2020-03-10 DIAGNOSIS — R21 Rash and other nonspecific skin eruption: Secondary | ICD-10-CM | POA: Diagnosis not present

## 2020-03-10 MED ORDER — GABAPENTIN 300 MG PO CAPS: 300.0000 mg | ORAL_CAPSULE | Freq: Three times a day (TID) | ORAL | 0 refills | Status: DC

## 2020-03-10 MED ORDER — VALACYCLOVIR HCL 1 G PO TABS: 1000.0000 mg | ORAL_TABLET | Freq: Three times a day (TID) | ORAL | 0 refills | Status: DC

## 2020-03-10 MED ORDER — PREDNISONE 10 MG PO TABS: ORAL_TABLET | ORAL | 0 refills | Status: DC

## 2020-03-10 NOTE — Progress Notes (Signed)
**Note Kendra-Identified via Obfuscation** BP (!) 143/88   Pulse 64   Temp 98.2 F (36.8 C) (Oral)   Wt 185 lb 9 oz (84.2 kg)   LMP 05/09/2014 (Approximate)   SpO2 98%   BMI 32.87 kg/m    Subjective:    Patient ID: Kendra Espinoza, female    DOB: Aug 27, 1960, 59 y.o.   MRN: [id number]  HPI: Kendra Espinoza is a 59 y.o. female  Chief Complaint  Patient presents with  . Rash    backof the head, neck and shoulders since last Wednesday, painfull and itchy   Pain, soreness, rash right scalp, down side of neck and upper back. Unable to lay on that side. Several lesions but no major vesicular rash but states it feels like when she's had shingles in the past. Denies fever, chills sweats, recent travel, sick contacts.   Relevant past medical, surgical, family and social history reviewed and updated as indicated. Interim medical history since our last visit reviewed. Allergies and medications reviewed and updated.  Review of Systems  Per HPI unless specifically indicated above     Objective:    BP (!) 143/88   Pulse 64   Temp 98.2 F (36.8 C) (Oral)   Wt 185 lb 9 oz (84.2 kg)   LMP 05/09/2014 (Approximate)   SpO2 98%   BMI 32.87 kg/m   Wt Readings from Last 3 Encounters:  03/10/20 185 lb 9 oz (84.2 kg)  02/20/20 186 lb 4 oz (84.5 kg)  01/01/20 190 lb (86.2 kg)    Physical Exam Vitals and nursing note reviewed.  Constitutional:      Appearance: Normal appearance. She is not ill-appearing.  HENT:     Head: Atraumatic.  Eyes:     Extraocular Movements: Extraocular movements intact.     Conjunctiva/sclera: Conjunctivae normal.  Cardiovascular:     Rate and Rhythm: Normal rate and regular rhythm.     Heart sounds: Normal heart sounds.  Pulmonary:     Effort: Pulmonary effort is normal.     Breath sounds: Normal breath sounds.  Musculoskeletal:        General: Normal range of motion.     Cervical back: Normal range of motion and neck supple.  Skin:    General: Skin is warm and dry.     Comments: Two small  erythematous pinpoint lesions right upper back. ttp right scalp and neck  Neurological:     Mental Status: She is alert and oriented to person, place, and time.  Psychiatric:        Mood and Affect: Mood normal.        Thought Content: Thought content normal.        Judgment: Judgment normal.    Results for orders placed or performed during the hospital encounter of 01/17/20  Comprehensive metabolic panel  Result Value Ref Range   Sodium 140 135 - 145 mmol/L   Potassium 5.3 (H) 3.5 - 5.1 mmol/L   Chloride 106 98 - 111 mmol/L   CO2 27 22 - 32 mmol/L   Glucose, Bld 121 (H) 70 - 99 mg/dL   BUN 10 6 - 20 mg/dL   Creatinine, Ser 0.91 0.44 - 1.00 mg/dL   Calcium 9.6 8.9 - 10.3 mg/dL   Total Protein 7.4 6.5 - 8.1 g/dL   Albumin 4.5 3.5 - 5.0 g/dL   AST 16 15 - 41 U/L   ALT 19 0 - 44 U/L   Alkaline Phosphatase 86 38 - 126 U/L  Total Bilirubin 0.9 0.3 - 1.2 mg/dL   GFR calc non Af Amer >60 >60 mL/min   GFR calc Af Amer >60 >60 mL/min   Anion gap 7 5 - 15      Assessment & Plan:   Problem List Items Addressed This Visit    None    Visit Diagnoses    Rash    -  Primary   No classic shingles rash, but sxs and hx consistent. Tx with valtrex, prednisone, OTC pain relievers. F/u if not resolving       Follow up plan: Return if symptoms worsen or fail to improve.

## 2020-03-12 ENCOUNTER — Encounter: Payer: Self-pay | Admitting: Family Medicine

## 2020-03-13 ENCOUNTER — Other Ambulatory Visit: Payer: Self-pay | Admitting: Family Medicine

## 2020-03-17 ENCOUNTER — Other Ambulatory Visit: Payer: Self-pay

## 2020-03-17 ENCOUNTER — Encounter: Payer: Self-pay | Admitting: Family Medicine

## 2020-03-17 ENCOUNTER — Ambulatory Visit
Admission: RE | Admit: 2020-03-17 | Discharge: 2020-03-17 | Disposition: A | Discharge status: Home / Self Care | Payer: BC Managed Care – PPO | Source: Ambulatory Visit | Attending: Family Medicine | Admitting: Family Medicine

## 2020-03-17 ENCOUNTER — Telehealth: Payer: Self-pay

## 2020-03-17 ENCOUNTER — Ambulatory Visit (INDEPENDENT_AMBULATORY_CARE_PROVIDER_SITE_OTHER): Payer: BC Managed Care – PPO | Admitting: Family Medicine

## 2020-03-17 VITALS — BP 160/104 | HR 80 | Temp 98.4°F | Wt 198.0 lb

## 2020-03-17 DIAGNOSIS — R197 Diarrhea, unspecified: Secondary | ICD-10-CM

## 2020-03-17 DIAGNOSIS — R2689 Other abnormalities of gait and mobility: Secondary | ICD-10-CM

## 2020-03-17 DIAGNOSIS — R002 Palpitations: Secondary | ICD-10-CM

## 2020-03-17 DIAGNOSIS — R42 Dizziness and giddiness: Secondary | ICD-10-CM

## 2020-03-17 NOTE — Assessment & Plan Note (Signed)
New onset 5 days ago with abnormal gait and balance. Will check labs and will obtain CT head to r/o stroke. Peer to peer done for authorization. Authorization # [id number], at [facility] Valid 8/31-2/26/22. Will also have patient get COVID tested due to her symptoms. Await results. Call with any concerns.

## 2020-03-17 NOTE — Progress Notes (Signed)
BP (!) 160/104 (BP Location: Left Arm, Patient Position: Sitting, Cuff Size: Normal)   Pulse 80   Temp 98.4 F (36.9 C) (Oral)   Wt 198 lb (89.8 kg)   LMP 05/09/2014 (Approximate)   SpO2 97%   BMI 35.07 kg/m    Subjective:    Patient ID: Kendra Espinoza, female    DOB: [birth year], 59 y.o.   MRN: [id number]  HPI: Kendra Espinoza is a 59 y.o. female  Chief Complaint  Patient presents with  . Rash    shingles  . Nausea  . Diarrhea  . Fatigue    no energy feels like sh eis in a fog somewhere.   . Dizziness    has subsided since stopped medication   Diagnosed with shingles about a week ago. Has not been feeling well since then. She notes that she has been very dizzy, fatigued, has walked into walls. Has had palpitations. Has had blurry vision. She stopped valacyclovir and gabapentin and has not gotten any better since coming off the medicine. She feels like her headache is worse than usual. She feels like she is in a fog. She has also had diarrhea- she normally has chronic diarrhea, which had resolved with medicine from her GI doctor, but seems to be back. She has been having chills and is not feeling well. She has not been vaccinated for COVID yet.   DIZZINESS Duration: 5 days Description of symptoms: lightheaded Duration of episode: with positional changes Dizziness frequency: recurrent Provoking factors: positional changes Aggravating factors:  none Triggered by rolling over in bed: no Triggered by bending over: no Aggravated by head movement: yes Aggravated by exertion, coughing, loud noises: no Recent head injury: no Recent or current viral symptoms: yes, chills and nausea and vomiting History of vasovagal episodes: no Nausea: yes Vomiting: no Tinnitus: yes Hearing loss: no Aural fullness: no Headache: yes- migraines have been worse Photophobia/phonophobia: yes Unsteady gait: yes Postural instability: yes Diplopia, dysarthria, dysphagia or weakness: no Related to  exertion: no Pallor: no Diaphoresis: no Dyspnea: no Chest pain: no   PALPITATIONS Duration: 6 days Symptom description: heart coming out of her chest Duration of episode: minutes Frequency: recurrentl Activity when event occurred: rest Related to exertion: no Dyspnea: no Chest pain: no Syncope: no Anxiety/stress: yes Nausea/vomiting: yes Diaphoresis: no Coronary artery disease: no Congestive heart failure: no Arrhythmia:no Thyroid disease: no Caffeine intake: none Status:  stable Treatments attempted:none   Relevant past medical, surgical, family and social history reviewed and updated as indicated. Interim medical history since our last visit reviewed. Allergies and medications reviewed and updated.  Review of Systems  Constitutional: Positive for activity change, chills and fatigue. Negative for appetite change, diaphoresis, fever and unexpected weight change.  HENT: Negative.   Respiratory: Negative.   Cardiovascular: Negative.   Gastrointestinal: Positive for diarrhea and nausea. Negative for abdominal distention, abdominal pain, anal bleeding, blood in stool, constipation, rectal pain and vomiting.  Genitourinary: Negative.   Musculoskeletal: Positive for gait problem. Negative for arthralgias, back pain, joint swelling, myalgias, neck pain and neck stiffness.  Skin: Negative.   Neurological: Positive for dizziness, weakness, light-headedness and headaches. Negative for tremors, seizures, syncope, facial asymmetry, speech difficulty and numbness.  Psychiatric/Behavioral: Positive for confusion. Negative for agitation, behavioral problems, decreased concentration, dysphoric mood, hallucinations, self-injury, sleep disturbance and suicidal ideas. The patient is not nervous/anxious and is not hyperactive.     Per HPI unless specifically indicated above     Objective:  BP (!) 160/104 (BP Location: Left Arm, Patient Position: Sitting, Cuff Size: Normal)   Pulse 80    Temp 98.4 F (36.9 C) (Oral)   Wt 198 lb (89.8 kg)   LMP 05/09/2014 (Approximate)   SpO2 97%   BMI 35.07 kg/m   Wt Readings from Last 3 Encounters:  03/17/20 198 lb (89.8 kg)  03/10/20 185 lb 9 oz (84.2 kg)  02/20/20 186 lb 4 oz (84.5 kg)    Physical Exam Vitals and nursing note reviewed.  Constitutional:      General: She is not in acute distress.    Appearance: Normal appearance. She is not ill-appearing, toxic-appearing or diaphoretic.  HENT:     Head: Normocephalic and atraumatic.     Right Ear: External ear normal.     Left Ear: External ear normal.     Nose: Nose normal.     Mouth/Throat:     Mouth: Mucous membranes are moist.     Pharynx: Oropharynx is clear.  Eyes:     General: No scleral icterus.       Right eye: No discharge.        Left eye: No discharge.     Extraocular Movements: Extraocular movements intact.     Right eye: Abnormal extraocular motion present. No nystagmus.     Left eye: No nystagmus.     Conjunctiva/sclera: Conjunctivae normal.     Pupils: Pupils are equal, round, and reactive to light.     Comments: Abnormal eye movements- looks to R and eyes involuntarily shot back to center   Cardiovascular:     Rate and Rhythm: Normal rate and regular rhythm.     Pulses: Normal pulses.     Heart sounds: Normal heart sounds. No murmur heard.  No friction rub. No gallop.   Pulmonary:     Effort: Pulmonary effort is normal. No respiratory distress.     Breath sounds: Normal breath sounds. No stridor. No wheezing, rhonchi or rales.  Chest:     Chest wall: No tenderness.  Musculoskeletal:        General: Normal range of motion.     Cervical back: Normal range of motion and neck supple.  Skin:    General: Skin is warm and dry.     Capillary Refill: Capillary refill takes less than 2 seconds.     Coloration: Skin is not jaundiced or pale.     Findings: No bruising, erythema, lesion or rash.  Neurological:     General: No focal deficit present.      Mental Status: She is alert and oriented to person, place, and time. Mental status is at baseline.     Coordination: Romberg sign positive. Coordination abnormal.     Gait: Gait abnormal.  Psychiatric:        Mood and Affect: Mood normal.        Behavior: Behavior normal.        Thought Content: Thought content normal.        Judgment: Judgment normal.     Results for orders placed or performed during the hospital encounter of 01/17/20  Comprehensive metabolic panel  Result Value Ref Range   Sodium 140 135 - 145 mmol/L   Potassium 5.3 (H) 3.5 - 5.1 mmol/L   Chloride 106 98 - 111 mmol/L   CO2 27 22 - 32 mmol/L   Glucose, Bld 121 (H) 70 - 99 mg/dL   BUN 10 6 - 20 mg/dL   Creatinine, Ser 0.91  0.44 - 1.00 mg/dL   Calcium 9.6 8.9 - 10.3 mg/dL   Total Protein 7.4 6.5 - 8.1 g/dL   Albumin 4.5 3.5 - 5.0 g/dL   AST 16 15 - 41 U/L   ALT 19 0 - 44 U/L   Alkaline Phosphatase 86 38 - 126 U/L   Total Bilirubin 0.9 0.3 - 1.2 mg/dL   GFR calc non Af Amer >60 >60 mL/min   GFR calc Af Amer >60 >60 mL/min   Anion gap 7 5 - 15      Assessment & Plan:   Problem List Items Addressed This Visit      Other   Dizziness - Primary    New onset 5 days ago with abnormal gait and balance. Will check labs and will obtain CT head to r/o stroke. Peer to peer done for authorization. Authorization # [id number], at [facility] Valid 8/31-2/26/22. Will also have patient get COVID tested due to her symptoms. Await results. Call with any concerns.        Relevant Orders   CT Head Wo Contrast   CBC with Differential/Platelet   Comprehensive metabolic panel   TSH   EKG 12-Lead (Completed)    Other Visit Diagnoses    Balance problem       See discussion under dizziness   Relevant Orders   CT Head Wo Contrast   CBC with Differential/Platelet   Comprehensive metabolic panel   TSH   Diarrhea, unspecified type       Unclear if this is due to her chronic diarrhea, viral illness or other cause. Will check labs  including COVID swab. Await results. Self-quarantine until results   Relevant Orders   Novel Coronavirus, NAA ([facility])   Palpitations       EKG normal by my intrpetation today. Will check labs and COVID swab. Await results.    Relevant Orders   EKG 12-Lead (Completed)       Follow up plan: Return Pending results..  >45 minutes spent with patient today.

## 2020-03-17 NOTE — Telephone Encounter (Signed)
Cheryl with [facility] calling with call report CT of the head. Reports normal. Report is in Epic. Spoke with Marion in the practice.

## 2020-03-17 NOTE — Patient Instructions (Signed)
To schedule a COVID test, please  text "COVID" to [phone], OR you can log on to [email or web address] to easily make an on-line appointment. If you do not have access to a smart phone or PC, you can call [phone] to get assistance.  

## 2020-03-18 ENCOUNTER — Other Ambulatory Visit: Payer: Self-pay

## 2020-03-18 ENCOUNTER — Other Ambulatory Visit: Payer: Self-pay | Admitting: Sleep Medicine

## 2020-03-18 ENCOUNTER — Other Ambulatory Visit: Payer: BC Managed Care – PPO

## 2020-03-18 DIAGNOSIS — I471 Supraventricular tachycardia: Secondary | ICD-10-CM

## 2020-03-18 LAB — CBC WITH DIFFERENTIAL/PLATELET
Basophils Absolute: 0.1 10*3/uL (ref 0.0–0.2)
Basos: 1 %
EOS (ABSOLUTE): 0.3 10*3/uL (ref 0.0–0.4)
Eos: 3 %
Hematocrit: 45.6 % (ref 34.0–46.6)
Hemoglobin: 15.1 g/dL (ref 11.1–15.9)
Immature Grans (Abs): 0.3 10*3/uL — ABNORMAL HIGH (ref 0.0–0.1)
Immature Granulocytes: 3 %
Lymphocytes Absolute: 3 10*3/uL (ref 0.7–3.1)
Lymphs: 29 %
MCH: 29.8 pg (ref 26.6–33.0)
MCHC: 33.1 g/dL (ref 31.5–35.7)
MCV: 90 fL (ref 79–97)
Monocytes Absolute: 0.7 10*3/uL (ref 0.1–0.9)
Monocytes: 7 %
Neutrophils Absolute: 6.2 10*3/uL (ref 1.4–7.0)
Neutrophils: 57 %
Platelets: 215 10*3/uL (ref 150–450)
RBC: 5.07 x10E6/uL (ref 3.77–5.28)
RDW: 12.6 % (ref 11.7–15.4)
WBC: 10.6 10*3/uL (ref 3.4–10.8)

## 2020-03-18 LAB — COMPREHENSIVE METABOLIC PANEL
ALT: 22 IU/L (ref 0–32)
AST: 13 IU/L (ref 0–40)
Albumin/Globulin Ratio: 1.7 (ref 1.2–2.2)
Albumin: 4.1 g/dL (ref 3.8–4.9)
Alkaline Phosphatase: 105 IU/L (ref 48–121)
BUN/Creatinine Ratio: 14 (ref 12–28)
BUN: 12 mg/dL (ref 8–27)
Bilirubin Total: 0.7 mg/dL (ref 0.0–1.2)
CO2: 22 mmol/L (ref 20–29)
Calcium: 9.2 mg/dL (ref 8.7–10.3)
Chloride: 103 mmol/L (ref 96–106)
Creatinine, Ser: 0.88 mg/dL (ref 0.57–1.00)
GFR calc Af Amer: 83 mL/min/{1.73_m2} (ref >59)
GFR calc non Af Amer: 72 mL/min/{1.73_m2} (ref >59)
Globulin, Total: 2.4 g/dL (ref 1.5–4.5)
Glucose: 121 mg/dL — ABNORMAL HIGH (ref 65–99)
Potassium: 4.2 mmol/L (ref 3.5–5.2)
Sodium: 139 mmol/L (ref 134–144)
Total Protein: 6.5 g/dL (ref 6.0–8.5)

## 2020-03-18 LAB — TSH: TSH: 2.93 u[IU]/mL (ref 0.450–4.500)

## 2020-03-19 LAB — NOVEL CORONAVIRUS, NAA: SARS-CoV-2, NAA: NOT DETECTED

## 2020-03-19 LAB — SPECIMEN STATUS REPORT

## 2020-03-24 NOTE — Progress Notes (Signed)
Interpreted by me today. NSR at 64bpm, no ST segment changes.

## 2020-03-31 ENCOUNTER — Ambulatory Visit: Payer: BC Managed Care – PPO | Admitting: Family Medicine

## 2020-04-07 ENCOUNTER — Encounter: Payer: Self-pay | Admitting: Family Medicine

## 2020-04-07 ENCOUNTER — Ambulatory Visit (INDEPENDENT_AMBULATORY_CARE_PROVIDER_SITE_OTHER): Payer: BC Managed Care – PPO | Admitting: Family Medicine

## 2020-04-07 ENCOUNTER — Telehealth: Payer: Self-pay

## 2020-04-07 ENCOUNTER — Other Ambulatory Visit: Payer: Self-pay

## 2020-04-07 VITALS — BP 142/90 | HR 81 | Temp 98.1°F | Wt 198.0 lb

## 2020-04-07 DIAGNOSIS — K529 Noninfective gastroenteritis and colitis, unspecified: Secondary | ICD-10-CM | POA: Diagnosis not present

## 2020-04-07 NOTE — Progress Notes (Signed)
BP (!) 142/90   Pulse 81   Temp 98.1 F (36.7 C) (Oral)   Wt 198 lb (89.8 kg)   LMP 05/09/2014 (Approximate)   SpO2 99%   BMI 35.07 kg/m    Subjective:    Patient ID: Kendra Espinoza, female    DOB: Mar 08, 1961, 59 y.o.   MRN: [id number]  HPI: Kendra Espinoza is a 59 y.o. female  Chief Complaint  Patient presents with  . Stomach Issues    patient is here to extend FMLA paperwork for ongoing stomach issues   Chronic abdominal issues and diarrhea. Follows with GI. Issues thought to be due to IBS. She did have testing for SIBO the last time she saw GI in August, however results are not available at this time. She is scheduled for a 2nd opinion with GI in October at [facility]. She has been on intermittent FMLA due to abdominal cramping and diarrhea with symptoms that are severe enough to keep her out of work. She was recently started on colestipol, but has not noticed any change. Having issues with her stomach about 3-4x a week. She notes that when she has the pain it's like a bad contraction. Then she will have diarrhea several times in a row. She has been taking imodium and pepto bismol to help with symptoms. She has not done any dietary changes. Has not taken any probiotics. It doesn't seem to correspond to food changes- it seems to change whether she reacts to any particular food. She has not noticed any blood in her stool. No fevers. She is otherwise feeling well with no other concerns or complaints at this time.    Relevant past medical, surgical, family and social history reviewed and updated as indicated. Interim medical history since our last visit reviewed. Allergies and medications reviewed and updated.  Review of Systems  Constitutional: Negative.   Respiratory: Negative.   Cardiovascular: Negative.   Gastrointestinal: Positive for abdominal pain and diarrhea. Negative for abdominal distention, anal bleeding, blood in stool, constipation, nausea, rectal pain and vomiting.    Genitourinary: Negative.   Musculoskeletal: Negative.   Neurological: Negative.   Psychiatric/Behavioral: Negative.     Per HPI unless specifically indicated above     Objective:    BP (!) 142/90   Pulse 81   Temp 98.1 F (36.7 C) (Oral)   Wt 198 lb (89.8 kg)   LMP 05/09/2014 (Approximate)   SpO2 99%   BMI 35.07 kg/m   Wt Readings from Last 3 Encounters:  04/07/20 198 lb (89.8 kg)  03/17/20 198 lb (89.8 kg)  03/10/20 185 lb 9 oz (84.2 kg)    Physical Exam Vitals and nursing note reviewed.  Constitutional:      General: She is not in acute distress.    Appearance: Normal appearance. She is not ill-appearing, toxic-appearing or diaphoretic.  HENT:     Head: Normocephalic and atraumatic.     Right Ear: External ear normal.     Left Ear: External ear normal.     Nose: Nose normal.     Mouth/Throat:     Mouth: Mucous membranes are moist.     Pharynx: Oropharynx is clear.  Eyes:     General: No scleral icterus.       Right eye: No discharge.        Left eye: No discharge.     Extraocular Movements: Extraocular movements intact.     Conjunctiva/sclera: Conjunctivae normal.     Pupils: Pupils are  equal, round, and reactive to light.  Cardiovascular:     Rate and Rhythm: Normal rate and regular rhythm.     Pulses: Normal pulses.     Heart sounds: Normal heart sounds. No murmur heard.  No friction rub. No gallop.   Pulmonary:     Effort: Pulmonary effort is normal. No respiratory distress.     Breath sounds: Normal breath sounds. No stridor. No wheezing, rhonchi or rales.  Chest:     Chest wall: No tenderness.  Musculoskeletal:        General: Normal range of motion.     Cervical back: Normal range of motion and neck supple.  Skin:    General: Skin is warm and dry.     Capillary Refill: Capillary refill takes less than 2 seconds.     Coloration: Skin is not jaundiced or pale.     Findings: No bruising, erythema, lesion or rash.  Neurological:     General: No  focal deficit present.     Mental Status: She is alert and oriented to person, place, and time. Mental status is at baseline.  Psychiatric:        Mood and Affect: Mood normal.        Behavior: Behavior normal.        Thought Content: Thought content normal.        Judgment: Judgment normal.     Results for orders placed or performed in visit on 03/18/20  Novel Coronavirus, NAA ([facility])   Specimen: Nasopharyngeal(NP) swabs in vial transport medium   Nasopharynge  Screenin  Result Value Ref Range   SARS-CoV-2, NAA Not Detected Not Detected  Specimen status report  Result Value Ref Range   specimen status report Comment       Assessment & Plan:   Problem List Items Addressed This Visit      Digestive   Chronic diarrhea - Primary    Thought to be IBS. Information given about FODMAPs and diet for IBS. Advised starting a probiotic. She has an appointment for a second opinion with [facility] in 3 weeks. Keep that appointment. Will fill out FMLA paperwork. 1-2x a week for 1 day for the next 3 months.           Follow up plan: Return 2-3 months, for physical.

## 2020-04-07 NOTE — Telephone Encounter (Signed)
FMLA paperwork completed and faxed back to the number provided by the patient. Called and LVM letting her know this was done

## 2020-04-07 NOTE — Assessment & Plan Note (Signed)
Thought to be IBS. Information given about FODMAPs and diet for IBS. Advised starting a probiotic. She has an appointment for a second opinion with [facility] in 3 weeks. Keep that appointment. Will fill out FMLA paperwork. 1-2x a week for 1 day for the next 3 months.

## 2020-04-07 NOTE — Patient Instructions (Addendum)
Probiotic  Diet for Irritable Bowel Syndrome When you have irritable bowel syndrome (IBS), it is very important to eat the foods and follow the eating habits that are best for your condition. IBS may cause various symptoms such as pain in the abdomen, constipation, or diarrhea. Choosing the right foods can help to ease the discomfort from these symptoms. Work with your health care provider and diet and nutrition specialist (dietitian) to find the eating plan that will help to control your symptoms. What are tips for following this plan?      Keep a food diary. This will help you identify foods that cause symptoms. Write down: ? What you eat and when you eat it. ? What symptoms you have. ? When symptoms occur in relation to your meals, such as "pain in abdomen 2 hours after dinner."  Eat your meals slowly and in a relaxed setting.  Aim to eat 5-6 small meals per day. Do not skip meals.  Drink enough fluid to keep your urine pale yellow.  Ask your health care provider if you should take an over-the-counter probiotic to help restore healthy bacteria in your gut (digestive tract). ? Probiotics are foods that contain good bacteria and yeasts.  Your dietitian may have specific dietary recommendations for you based on your symptoms. He or she may recommend that you: ? Avoid foods that cause symptoms. Talk with your dietitian about other ways to get the same nutrients that are in those problem foods. ? Avoid foods with gluten. Gluten is a protein that is found in rye, wheat, and barley. ? Eat more foods that contain soluble fiber. Examples of foods with high soluble fiber include oats, seeds, and certain fruits and vegetables. Take a fiber supplement if directed by your dietitian. ? Reduce or avoid certain foods called FODMAPs. These are foods that contain carbohydrates that are hard to digest. Ask your doctor which foods contain these carbohydrates. What foods are not recommended? The following  are some foods and drinks that may make your symptoms worse:  Fatty foods, such as french fries.  Foods that contain gluten, such as pasta and cereal.  Dairy products, such as milk, cheese, and ice cream.  Chocolate.  Alcohol.  Products with caffeine, such as coffee.  Carbonated drinks, such as soda.  Foods that are high in FODMAPs. These include certain fruits and vegetables.  Products with sweeteners such as honey, high fructose corn syrup, sorbitol, and mannitol. The items listed above may not be a complete list of foods and beverages you should avoid. Contact a dietitian for more information. What foods are good sources of fiber? Your health care provider or dietitian may recommend that you eat more foods that contain fiber. Fiber can help to reduce constipation and other IBS symptoms. Add foods with fiber to your diet a little at a time so your body can get used to them. Too much fiber at one time might cause gas and swelling of your abdomen. The following are some foods that are good sources of fiber:  Berries, such as raspberries, strawberries, and blueberries.  Tomatoes.  Carrots.  Brown rice.  Oats.  Seeds, such as chia and pumpkin seeds. The items listed above may not be a complete list of recommended sources of fiber. Contact your dietitian for more options. Where to find more information  International Foundation for Functional Gastrointestinal Disorders: www.iffgd.[employer] of Diabetes and Digestive and Kidney Diseases: [employer] Summary  When you have irritable bowel syndrome (IBS),  it is very important to eat the foods and follow the eating habits that are best for your condition.  IBS may cause various symptoms such as pain in the abdomen, constipation, or diarrhea.  Choosing the right foods can help to ease the discomfort that comes from symptoms.  Keep a food diary. This will help you identify foods that cause symptoms.  Your  health care provider or diet and nutrition specialist (dietitian) may recommend that you eat more foods that contain fiber. This information is not intended to replace advice given to you by your health care provider. Make sure you discuss any questions you have with your health care provider. Document Revised: 10/24/2018 Document Reviewed: 03/07/2017 Elsevier Patient Education  [place].  Irritable Bowel Syndrome, Adult  Irritable bowel syndrome (IBS) is a group of symptoms that affects the organs responsible for digestion (gastrointestinal or GI tract). IBS is not one specific disease. To regulate how the GI tract works, the body sends signals back and forth between the intestines and the brain. If you have IBS, there may be a problem with these signals. As a result, the GI tract does not function normally. The intestines may become more sensitive and overreact to certain things. This may be especially true when you eat certain foods or when you are under stress. There are four types of IBS. These may be determined based on the consistency of your stool (feces):  IBS with diarrhea.  IBS with constipation.  Mixed IBS.  Unsubtyped IBS. It is important to know which type of IBS you have. Certain treatments are more likely to be helpful for certain types of IBS. What are the causes? The exact cause of IBS is not known. What increases the risk? You may have a higher risk for IBS if you:  Are female.  Are younger than 18.  Have a family history of IBS.  Have a mental health condition, such as depression, anxiety, or post-traumatic stress disorder.  Have had a bacterial infection of your GI tract. What are the signs or symptoms? Symptoms of IBS vary from person to person. The main symptom is abdominal pain or discomfort. Other symptoms usually include one or more of the following:  Diarrhea, constipation, or both.  Abdominal swelling or bloating.  Feeling full after  eating a small or regular-sized meal.  Frequent gas.  Mucus in the stool.  A feeling of having more stool left after a bowel movement. Symptoms tend to come and go. They may be triggered by stress, mental health conditions, or certain foods. How is this diagnosed? This condition may be diagnosed based on a physical exam, your medical history, and your symptoms. You may have tests, such as:  Blood tests.  Stool test.  X-rays.  CT scan.  Colonoscopy. This is a procedure in which your GI tract is viewed with a long, thin, flexible tube. How is this treated? There is no cure for IBS, but treatment can help relieve symptoms. Treatment depends on the type of IBS you have, and may include:  Changes to your diet, such as: ? Avoiding foods that cause symptoms. ? Drinking more water. ? Following a low-FODMAP (fermentable oligosaccharides, disaccharides, monosaccharides, and polyols) diet for up to 6 weeks, or as told by your health care provider. FODMAPs are sugars that are hard for some people to digest. ? Eating more fiber. ? Eating medium-sized meals at the same times every day.  Medicines. These may include: ? Fiber supplements, if you  have constipation. ? Medicine to control diarrhea (antidiarrheal medicines). ? Medicine to help control muscle tightening (spasms) in your GI tract (antispasmodic medicines). ? Medicines to help with mental health conditions, such as antidepressants or tranquilizers.  Talk therapy or counseling.  Working with a diet and nutrition specialist (dietitian) to help create a food plan that is right for you.  Managing your stress. Follow these instructions at home: Eating and drinking  Eat a healthy diet.  Eat medium-sized meals at about the same time every day. Do not eat large meals.  Gradually eat more fiber-rich foods. These include whole grains, fruits, and vegetables. This may be especially helpful if you have IBS with constipation.  Eat a  diet low in FODMAPs.  Drink enough fluid to keep your urine pale yellow.  Keep a journal of foods that seem to trigger symptoms.  Avoid foods and drinks that: ? Contain added sugar. ? Make your symptoms worse. Dairy products, caffeinated drinks, and carbonated drinks can make symptoms worse for some people. General instructions  Take over-the-counter and prescription medicines and supplements only as told by your health care provider.  Get enough exercise. Do at least 150 minutes of moderate-intensity exercise each week.  Manage your stress. Getting enough sleep and exercise can help you manage stress.  Keep all follow-up visits as told by your health care provider and therapist. This is important. Alcohol Use  Do not drink alcohol if: ? Your health care provider tells you not to drink. ? You are pregnant, may be pregnant, or are planning to become pregnant.  If you drink alcohol, limit how much you have: ? 0-1 drink a day for women. ? 0-2 drinks a day for men.  Be aware of how much alcohol is in your drink. In the [place] one drink equals one typical bottle of beer (12 oz), one-half glass of wine (5 oz), or one shot of hard liquor (1 oz). Contact a health care provider if you have:  Constant pain.  Weight loss.  Difficulty or pain when swallowing.  Diarrhea that gets worse. Get help right away if you have:  Severe abdominal pain.  Fever.  Diarrhea with symptoms of dehydration, such as dizziness or dry mouth.  Bright red blood in your stool.  Stool that is black and tarry.  Abdominal swelling.  Vomiting that does not stop.  Blood in your vomit. Summary  Irritable bowel syndrome (IBS) is not one specific disease. It is a group of symptoms that affects digestion.  Your intestines may become more sensitive and overreact to certain things. This may be especially true when you eat certain foods or when you are under stress.  There is no cure for IBS, but  treatment can help relieve symptoms. This information is not intended to replace advice given to you by your health care provider. Make sure you discuss any questions you have with your health care provider. Document Revised: 06/27/2017 Document Reviewed: 06/27/2017 Elsevier Patient Education  2020 [place]  FODMAPs (fermentable oligosaccharides, disaccharides, monosaccharides, and polyols) are sugars that are hard for some people to digest. A low-FODMAP eating plan may help some people who have bowel (intestinal) diseases to manage their symptoms. This meal plan can be complicated to follow. Work with a diet and nutrition specialist (dietitian) to make a low-FODMAP eating plan that is right for you. A dietitian can make sure that you get enough nutrition from this diet. What are tips for following this plan?  Reading food labels  Check labels for hidden FODMAPs such as: ? High-fructose syrup. ? Honey. ? Agave. ? Natural fruit flavors. ? Onion or garlic powder.  Choose low-FODMAP foods that contain 3-4 grams of fiber per serving.  Check food labels for serving sizes. Eat only one serving at a time to make sure FODMAP levels stay low. Meal planning  Follow a low-FODMAP eating plan for up to 6 weeks, or as told by your health care provider or dietitian.  To follow the eating plan: 1. Eliminate high-FODMAP foods from your diet completely. 2. Gradually reintroduce high-FODMAP foods into your diet one at a time. Most people should wait a few days after introducing one high-FODMAP food before they introduce the next high-FODMAP food. Your dietitian can recommend how quickly you may reintroduce foods. 3. Keep a daily record of what you eat and drink, and make note of any symptoms that you have after eating. 4. Review your daily record with a dietitian regularly. Your dietitian can help you identify which foods you can eat and which foods you should avoid. General  tips  Drink enough fluid each day to keep your urine pale yellow.  Avoid processed foods. These often have added sugar and may be high in FODMAPs.  Avoid most dairy products, whole grains, and sweeteners.  Work with a dietitian to make sure you get enough fiber in your diet. Recommended foods Grains  Gluten-free grains, such as rice, oats, buckwheat, quinoa, corn, polenta, and millet. Gluten-free pasta, bread, or cereal. Rice noodles. Corn tortillas. Vegetables  Eggplant, zucchini, cucumber, peppers, green beans, Brussels sprouts, bean sprouts, lettuce, arugula, kale, Swiss chard, spinach, collard greens, bok choy, summer squash, potato, and tomato. Limited amounts of corn, carrot, and sweet potato. Green parts of scallions. Fruits  Bananas, oranges, lemons, limes, blueberries, raspberries, strawberries, grapes, cantaloupe, honeydew melon, kiwi, papaya, passion fruit, and pineapple. Limited amounts of dried cranberries, banana chips, and shredded coconut. Dairy  Lactose-free milk, yogurt, and kefir. Lactose-free cottage cheese and ice cream. Non-dairy milks, such as almond, coconut, hemp, and rice milk. Yogurts made of non-dairy milks. Limited amounts of goat cheese, brie, mozzarella, parmesan, swiss, and other hard cheeses. Meats and other protein foods  Unseasoned beef, pork, poultry, or fish. Eggs. Berniece Salines. Tofu (firm) and tempeh. Limited amounts of nuts and seeds, such as almonds, walnuts, [place] nuts, pecans, peanuts, pumpkin seeds, chia seeds, and sunflower seeds. Fats and oils  Butter-free spreads. Vegetable oils, such as olive, canola, and sunflower oil. Seasoning and other foods  Artificial sweeteners with names that do not end in "ol" such as aspartame, saccharine, and stevia. Maple syrup, white table sugar, raw sugar, brown sugar, and molasses. Fresh basil, coriander, parsley, rosemary, and thyme. Beverages  Water and mineral water. Sugar-sweetened soft drinks. Small amounts  of orange juice or cranberry juice. Black and green tea. Most dry wines. Coffee. This may not be a complete list of low-FODMAP foods. Talk with your dietitian for more information. Foods to avoid Grains  Wheat, including kamut, durum, and semolina. Barley and bulgur. Couscous. Wheat-based cereals. Wheat noodles, bread, crackers, and pastries. Vegetables  Chicory root, artichoke, asparagus, cabbage, snow peas, sugar snap peas, mushrooms, and cauliflower. Onions, garlic, leeks, and the white part of scallions. Fruits  Fresh, dried, and juiced forms of apple, pear, watermelon, peach, plum, cherries, apricots, blackberries, boysenberries, figs, nectarines, and mango. Avocado. Dairy  Milk, yogurt, ice cream, and soft cheese. Cream and sour cream. Milk-based sauces. Custard. Meats and other protein foods  Fried or fatty meat. Sausage. Cashews and pistachios. Soybeans, baked beans, black beans, chickpeas, kidney beans, fava beans, navy beans, lentils, and split peas. Seasoning and other foods  Any sugar-free gum or candy. Foods that contain artificial sweeteners such as sorbitol, mannitol, isomalt, or xylitol. Foods that contain honey, high-fructose corn syrup, or agave. Bouillon, vegetable stock, beef stock, and chicken stock. Garlic and onion powder. Condiments made with onion, such as hummus, chutney, pickles, relish, salad dressing, and salsa. Tomato paste. Beverages  Chicory-based drinks. Coffee substitutes. Chamomile tea. Fennel tea. Sweet or fortified wines such as port or sherry. Diet soft drinks made with isomalt, mannitol, maltitol, sorbitol, or xylitol. Apple, pear, and mango juice. Juices with high-fructose corn syrup. This may not be a complete list of high-FODMAP foods. Talk with your dietitian to discuss what dietary choices are best for you.  Summary  A low-FODMAP eating plan is a short-term diet that eliminates FODMAPs from your diet to help ease symptoms of certain bowel  diseases.  The eating plan usually lasts up to 6 weeks. After that, high-FODMAP foods are restarted gradually, one at a time, so you can find out which may be causing symptoms.  A low-FODMAP eating plan can be complicated. It is best to work with a dietitian who has experience with this type of plan. This information is not intended to replace advice given to you by your health care provider. Make sure you discuss any questions you have with your health care provider. Document Revised: 06/16/2017 Document Reviewed: 02/28/2017 Elsevier Patient Education  [place].

## 2020-04-24 ENCOUNTER — Other Ambulatory Visit: Payer: Self-pay

## 2020-04-24 ENCOUNTER — Telehealth: Payer: Self-pay

## 2020-04-24 DIAGNOSIS — G43909 Migraine, unspecified, not intractable, without status migrainosus: Secondary | ICD-10-CM

## 2020-04-24 MED ORDER — TOPIRAMATE 25 MG PO TABS: 25.0000 mg | ORAL_TABLET | Freq: Two times a day (BID) | ORAL | 2 refills | Status: DC

## 2020-04-24 NOTE — Telephone Encounter (Signed)
Pt would like topamax sent to [facility] as she only has 7 pills left.

## 2020-04-24 NOTE — Telephone Encounter (Signed)
Called pt let her know that her medication was sent in to [facility] in [place]. Pt verbalized understanding.  KP

## 2020-04-29 ENCOUNTER — Ambulatory Visit
Admission: RE | Admit: 2020-04-29 | Discharge: 2020-04-29 | Disposition: A | Discharge status: Home / Self Care | Payer: BC Managed Care – PPO | Source: Ambulatory Visit | Attending: Nurse Practitioner | Admitting: Nurse Practitioner

## 2020-04-29 ENCOUNTER — Encounter: Payer: Self-pay | Admitting: Nurse Practitioner

## 2020-04-29 ENCOUNTER — Other Ambulatory Visit: Payer: Self-pay

## 2020-04-29 ENCOUNTER — Ambulatory Visit
Admission: RE | Admit: 2020-04-29 | Discharge: 2020-04-29 | Disposition: A | Discharge status: Home / Self Care | Payer: BC Managed Care – PPO | Attending: Nurse Practitioner | Admitting: Nurse Practitioner

## 2020-04-29 ENCOUNTER — Telehealth (INDEPENDENT_AMBULATORY_CARE_PROVIDER_SITE_OTHER): Payer: BC Managed Care – PPO | Admitting: Nurse Practitioner

## 2020-04-29 ENCOUNTER — Encounter: Payer: Self-pay | Admitting: Family Medicine

## 2020-04-29 VITALS — BP 155/89 | HR 83 | Temp 100.1°F | Wt 194.0 lb

## 2020-04-29 DIAGNOSIS — R059 Cough, unspecified: Secondary | ICD-10-CM

## 2020-04-29 MED ORDER — PREDNISONE 20 MG PO TABS: 40.0000 mg | ORAL_TABLET | Freq: Every day | ORAL | 0 refills | Status: AC

## 2020-04-29 MED ORDER — ALBUTEROL SULFATE HFA 108 (90 BASE) MCG/ACT IN AERS: 2.0000 | INHALATION_SPRAY | Freq: Four times a day (QID) | RESPIRATORY_TRACT | 0 refills | Status: DC | PRN

## 2020-04-29 MED ORDER — BENZONATATE 100 MG PO CAPS: 100.0000 mg | ORAL_CAPSULE | Freq: Three times a day (TID) | ORAL | 0 refills | Status: DC | PRN

## 2020-04-29 NOTE — Progress Notes (Signed)
BP (!) 155/89   Pulse 83   Temp 100.1 F (37.8 C) (Oral)   Wt 194 lb (88 kg)   LMP 05/09/2014 (Approximate)   BMI 34.37 kg/m    Subjective:    Patient ID: Kendra Espinoza, female    DOB: 10/17/1960, 59 y.o.   MRN: [id number]  HPI: Kendra Espinoza is a 59 y.o. female  Chief Complaint  Patient presents with  . Cough    fever, cough, chills, and itching all over body. Patient reports she has been takig nyquil, reports no symptom control.     . This visit was completed via MyChart due to the restrictions of the COVID-19 pandemic. All issues as above were discussed and addressed. Physical exam was done as above through visual confirmation on MyChart. If it was felt that the patient should be evaluated in the office, they were directed there. The patient verbally consented to this visit. . Location of the patient: home . Location of the provider: work . Those involved with this call:  . Provider: Marnee Guarneri, DNP . CMA: Yvonna Alanis, CMA . Front Desk/Registration: Don Perking  . Time spent on call: 20 minutes with patient face to face via video conference. More than 50% of this time was spent in counseling and coordination of care. `5 minutes total spent in review of patient's record and preparation of their chart.  . I verified patient identity using two factors (patient name and date of birth). Patient consents verbally to being seen via telemedicine visit today.    UPPER RESPIRATORY TRACT INFECTION Last Wednesday and Thursday started having really bad headaches, in bed for two days.  Missed work those days.  Had 2nd Covid shot Friday.  Then Sunday started with fever, chills, muscle aches, runny nose, and cough.  Works at [employer] -- not around a lot of people. Fever: yes Cough: yes Shortness of breath: no Wheezing: no Chest pain: no Chest tightness: yes Chest congestion: yes Nasal congestion: yes Runny nose: yes Post nasal drip: yes Sneezing: no Sore  throat: no Swollen glands: no Sinus pressure: yes Headache: yes Face pain: no Toothache: no Ear pain: none Ear pressure: none Eyes red/itching:no Eye drainage/crusting: no  Vomiting: no Rash: no Fatigue: yes Sick contacts: no Strep contacts: no  Context: fluctuating Recurrent sinusitis: no Relief with OTC cold/cough medications: no  Treatments attempted: cold/sinus   Relevant past medical, surgical, family and social history reviewed and updated as indicated. Interim medical history since our last visit reviewed. Allergies and medications reviewed and updated.  Review of Systems  Constitutional: Positive for chills, diaphoresis, fatigue and fever. Negative for activity change and appetite change.  HENT: Positive for congestion, postnasal drip, rhinorrhea and sinus pressure. Negative for ear discharge, ear pain, facial swelling, sinus pain, sneezing, sore throat and voice change.   Eyes: Negative for pain and visual disturbance.  Respiratory: Positive for cough and chest tightness. Negative for shortness of breath and wheezing.   Cardiovascular: Negative for chest pain, palpitations and leg swelling.  Gastrointestinal: Negative for abdominal distention, abdominal pain, constipation, diarrhea, nausea and vomiting.  Endocrine: Negative.   Musculoskeletal: Positive for myalgias.  Neurological: Negative for dizziness, numbness and headaches.  Psychiatric/Behavioral: Negative.     Per HPI unless specifically indicated above     Objective:    BP (!) 155/89   Pulse 83   Temp 100.1 F (37.8 C) (Oral)   Wt 194 lb (88 kg)   LMP 05/09/2014 (Approximate)  BMI 34.37 kg/m   Wt Readings from Last 3 Encounters:  04/29/20 194 lb (88 kg)  04/07/20 198 lb (89.8 kg)  03/17/20 198 lb (89.8 kg)    Physical Exam Vitals and nursing note reviewed.  Constitutional:      General: She is awake. She is not in acute distress.    Appearance: She is well-developed and well-groomed. She is  obese. She is ill-appearing. She is not toxic-appearing.  HENT:     Head: Normocephalic.     Right Ear: Hearing normal.     Left Ear: Hearing normal.  Eyes:     General: Lids are normal.        Right eye: No discharge.        Left eye: No discharge.     Conjunctiva/sclera: Conjunctivae normal.  Pulmonary:     Effort: Pulmonary effort is normal. No accessory muscle usage or respiratory distress.     Comments: Unable to auscultate due to virtual exam only.  Frequent dry cough noted during exam period.  No SOB with talking.  Musculoskeletal:     Cervical back: Normal range of motion.  Neurological:     Mental Status: She is alert and oriented to person, place, and time.  Psychiatric:        Attention and Perception: Attention normal.        Mood and Affect: Mood normal.        Behavior: Behavior normal. Behavior is cooperative.        Thought Content: Thought content normal.        Judgment: Judgment normal.    Results for orders placed or performed in visit on 03/18/20  Novel Coronavirus, NAA ([facility])   Specimen: Nasopharyngeal(NP) swabs in vial transport medium   Nasopharynge  Screenin  Result Value Ref Range   SARS-CoV-2, NAA Not Detected Not Detected  Specimen status report  Result Value Ref Range   specimen status report Comment       Assessment & Plan:   Problem List Items Addressed This Visit      Other   Cough - Primary    Acute x one week, headache symptoms started one week ago and cough on Sunday with fever.  Concern for Covid, just received second vaccine on Friday last week.  Have highly recommended she obtain Covid testing, advised [facility].  She is to notify provider of results as may be candidate for MAB since major symptoms started 3 days ago.  At this time will order CXR to assess for PNA, has history of PNA years ago.  Scripts for Albuterol and Tessalon sent to use as needed + short burst of Prednisone.  Recommend to self quarantine until  symptoms improve or testing returned, if + continue quarantine x 10 days -- work note for this week sent.  Continue to get plenty of rest and fluids + Tylenol as needed for fever.  Immediately go to ER if SOB or CP present.  Return to office as needed for worsening or ongoing symptoms -- will schedule sooner dependent on Covid and CXR results.      Relevant Orders   DG Chest 2 View      I discussed the assessment and treatment plan with the patient. The patient was provided an opportunity to ask questions and all were answered. The patient agreed with the plan and demonstrated an understanding of the instructions.   The patient was advised to call back or seek an in-person evaluation  if the symptoms worsen or if the condition fails to improve as anticipated.   I provided 21+ minutes of time during this encounter.  Follow up plan: Return if symptoms worsen or fail to improve.

## 2020-04-29 NOTE — Assessment & Plan Note (Signed)
Acute x one week, headache symptoms started one week ago and cough on Sunday with fever.  Concern for Covid, just received second vaccine on Friday last week.  Have highly recommended she obtain Covid testing, advised [facility].  She is to notify provider of results as may be candidate for MAB since major symptoms started 3 days ago.  At this time will order CXR to assess for PNA, has history of PNA years ago.  Scripts for Albuterol and Tessalon sent to use as needed + short burst of Prednisone.  Recommend to self quarantine until symptoms improve or testing returned, if + continue quarantine x 10 days -- work note for this week sent.  Continue to get plenty of rest and fluids + Tylenol as needed for fever.  Immediately go to ER if SOB or CP present.  Return to office as needed for worsening or ongoing symptoms -- will schedule sooner dependent on Covid and CXR results.

## 2020-04-29 NOTE — Patient Instructions (Signed)

## 2020-05-01 NOTE — Progress Notes (Signed)
Contacted via MyChart  Good evening Roxanne, your imaging returned and shows no pneumonia.  It does show an incidental finding, which does not have to do with cough, which is some possible mild increase in size of the aorta, a vessel that transports blood to the heart.  With this goal is ensuring your blood pressure remains well controlled and monitoring.  Any questions? Keep being awesome!!  Thank you for allowing me to participate in your care. Kindest regards, Kitana Gage

## 2020-05-05 ENCOUNTER — Telehealth: Payer: Self-pay

## 2020-05-05 NOTE — Telephone Encounter (Signed)
Pt came in to drop off FMLA paper work, left in bin to be reviewed.

## 2020-05-07 ENCOUNTER — Ambulatory Visit: Payer: BC Managed Care – PPO | Admitting: Family Medicine

## 2020-05-07 ENCOUNTER — Encounter: Payer: Self-pay | Admitting: Family Medicine

## 2020-05-08 NOTE — Telephone Encounter (Signed)
FMLA paperwork completed and faxed in for patient. Called and LVM letting patient know that this was done for her. Copies placed in scan bin and original up front for patient pick up if she needs it.

## 2020-05-12 ENCOUNTER — Ambulatory Visit: Payer: BC Managed Care – PPO | Admitting: Nurse Practitioner

## 2020-05-13 ENCOUNTER — Other Ambulatory Visit: Payer: Self-pay

## 2020-05-13 ENCOUNTER — Ambulatory Visit
Admission: RE | Admit: 2020-05-13 | Discharge: 2020-05-13 | Disposition: A | Discharge status: Home / Self Care | Payer: BC Managed Care – PPO | Source: Ambulatory Visit | Attending: Emergency Medicine | Admitting: Emergency Medicine

## 2020-05-13 VITALS — BP 164/98 | HR 61 | Temp 98.0°F | Resp 14 | Ht 63.0 in | Wt 194.0 lb

## 2020-05-13 DIAGNOSIS — R030 Elevated blood-pressure reading, without diagnosis of hypertension: Secondary | ICD-10-CM | POA: Diagnosis present

## 2020-05-13 DIAGNOSIS — F4321 Adjustment disorder with depressed mood: Secondary | ICD-10-CM | POA: Diagnosis present

## 2020-05-13 DIAGNOSIS — Z20822 Contact with and (suspected) exposure to covid-19: Secondary | ICD-10-CM | POA: Diagnosis present

## 2020-05-13 DIAGNOSIS — F5102 Adjustment insomnia: Secondary | ICD-10-CM | POA: Diagnosis present

## 2020-05-13 DIAGNOSIS — J069 Acute upper respiratory infection, unspecified: Secondary | ICD-10-CM | POA: Diagnosis present

## 2020-05-13 LAB — SARS CORONAVIRUS 2 (TAT 6-24 HRS): SARS Coronavirus 2: NEGATIVE

## 2020-05-13 MED ORDER — FLUTICASONE PROPIONATE 50 MCG/ACT NA SUSP: 2.0000 | Freq: Every day | NASAL | 0 refills | Status: DC

## 2020-05-13 MED ORDER — HYDROXYZINE HCL 50 MG PO TABS: 50.0000 mg | ORAL_TABLET | Freq: Four times a day (QID) | ORAL | 1 refills | Status: DC | PRN

## 2020-05-13 NOTE — ED Triage Notes (Signed)
Patient states that she had some issues with insomnia that started 3 days ago. States her niece died on October 19, 2022 and she has been under stress from this. Patient states that she takes Seroquel but that hasn't seemed to be working.  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 States that she was told to be seen by a provider in order to return to work.

## 2020-05-13 NOTE — ED Provider Notes (Signed)
HPI  SUBJECTIVE:  Kendra Espinoza is a 59 y.o. female who presents with 2 issues: First, she reports insomnia for the past 3 days after her niece, who she was close to, unexpectedly passed away.  States that she is getting no more than 30 minutes of sleep at a time.  States that she is ruminating.  She states that she feels guilty, has decreased appetite, and feels sad.  She states that her mind is racing.  She has not worked because of the lack of sleep.  She works third shift.  She denies suicidal /homicidal ideations, auditory/ visual hallucinations.  She has tried Seroquel which is prescribed to her for sleep without improvement in her symptoms.  No Aggravating factors.    Second, she reports headaches, nasal congestion, rhinorrhea, postnasal drip, scratchy itchy throat starting 2 days ago.  No fevers, body aches, sinus pain or pressure, loss of sense of smell or taste, cough, shortness of breath, nausea, vomiting, new or change in her baseline diarrhea, new or different abdominal pain.  No allergy symptoms.  No GERD symptoms.  No known Covid, cold, flu exposure.  She got the second dose of the Pfizer Covid vaccine 3 weeks ago.  She has tried NyQuil with improvement in her symptoms.  No aggravating factors.  No antipyretic in the past 6 hours.  She has a past medical history of insomnia.  No history of depression, mood disorders, hypertension, GERD, pulmonary disease, diabetes.  JKD:[email or web address], Barb Merino, DO   Past Medical History:  Diagnosis Date  . Cancer ([place])    skin  . Migraines    about 1x/month since starting meds    Past Surgical History:  Procedure Laterality Date  . CESAREAN SECTION    . COLONOSCOPY WITH PROPOFOL N/A 06/11/2019   Procedure: COLONOSCOPY WITH BIOPSIES;  Surgeon: Virgel Manifold, MD;  Location: [facility];  Service: Endoscopy;  Laterality: N/A;  . ESOPHAGOGASTRODUODENOSCOPY (EGD) WITH PROPOFOL N/A 11/22/2019   Procedure: ESOPHAGOGASTRODUODENOSCOPY (EGD) WITH  PROPOFOL;  Surgeon: Virgel Manifold, MD;  Location: [facility];  Service: Endoscopy;  Laterality: N/A;  . POLYPECTOMY N/A 06/11/2019   Procedure: POLYPECTOMY;  Surgeon: Virgel Manifold, MD;  Location: [facility];  Service: Endoscopy;  Laterality: N/A;  . skin cancer removal    . TONSILLECTOMY      Family History  Problem Relation Age of Onset  . Heart disease Mother   . Hypertension Mother   . Stroke Mother   . Heart attack Father 22  . Heart disease Father   . Hypertension Father   . Drug abuse Sister   . Hodgkin's lymphoma Brother   . Heart attack Maternal Grandmother   . Heart attack Maternal Grandfather   . Heart attack Paternal Grandfather     Social History   Tobacco Use  . Smoking status: Never Smoker  . Smokeless tobacco: Never Used  Vaping Use  . Vaping Use: Never used  Substance Use Topics  . Alcohol use: Not Currently    Comment: occasionally  . Drug use: No    No current facility-administered medications for this encounter.  Current Outpatient Medications:  .  hyoscyamine (LEVSIN SL) 0.125 MG SL tablet, Place under the tongue every 4 (four) hours., Disp: , Rfl:  .  QUEtiapine (SEROQUEL) 50 MG tablet, Take 1 tablet (50 mg total) by mouth daily. Patient taking 1/2 of pill at bed time (25 mg total), Disp: 90 tablet, Rfl: 1 .  topiramate (TOPAMAX) 25  MG tablet, Take 1 tablet (25 mg total) by mouth 2 (two) times daily., Disp: 60 tablet, Rfl: 2 .  fluticasone (FLONASE) 50 MCG/ACT nasal spray, Place 2 sprays into both nostrils daily., Disp: 16 g, Rfl: 0 .  hydrOXYzine (ATARAX/VISTARIL) 50 MG tablet, Take 1 tablet (50 mg total) by mouth every 6 (six) hours as needed for anxiety. May take 2 tabs at night, Disp: 30 tablet, Rfl: 1  Allergies  Allergen Reactions  . Codeine     Dizziness     ROS  As noted in HPI.   Physical Exam  BP (!) 164/98 (BP Location: Left Arm)   Pulse 61   Temp 98 F (36.7 C) (Oral)   Resp 14   Ht 5\' 3"   (1.6 m)   Wt 88 kg   LMP 05/09/2014 (Approximate)   SpO2 100%   BMI 34.37 kg/m   BP Readings from Last 3 Encounters:  05/13/20 (!) 164/98  04/29/20 (!) 155/89  04/07/20 (!) 142/90     Constitutional: Well developed, well nourished, no acute distress.  Appears sad. Eyes:  EOMI, conjunctiva normal bilaterally HENT: Normocephalic, atraumatic,mucus membranes moist.  Positive erythematous, not swollen turbinates.  Nasal congestion.  Mild maxillary sinus tenderness.  No frontal sinus tenderness.  Tonsils surgically absent.  Positive postnasal drip. neck: No cervical lymphadenopathy Respiratory: Normal inspiratory effort, lungs clear bilaterally Cardiovascular: Normal rate, regular rhythm no murmurs rubs or gallops GI: nondistended, mild diffuse tenderness, but patient states that this is not new.  No guarding, rebound. skin: No rash, skin intact Musculoskeletal: no deformities Neurologic: Alert & oriented x 3, no focal neuro deficits Psychiatric: Speech and behavior appropriate.  Normal affect.  Lucid, goal oriented thinking.   ED Course   Medications - No data to display  Orders Placed This Encounter  Procedures  . SARS CORONAVIRUS 2 (TAT 6-24 HRS) Nasopharyngeal Nasopharyngeal Swab    Standing Status:   Standing    Number of Occurrences:   1    Order Specific Question:   Is this test for diagnosis or screening    Answer:   Diagnosis of ill patient    Order Specific Question:   Symptomatic for COVID-19 as defined by CDC    Answer:   Yes    Order Specific Question:   Date of Symptom Onset    Answer:   05/10/2020    Order Specific Question:   Hospitalized for COVID-19    Answer:   No    Order Specific Question:   Admitted to ICU for COVID-19    Answer:   No    Order Specific Question:   Previously tested for COVID-19    Answer:   No    Order Specific Question:   Resident in a congregate (group) care setting    Answer:   No    Order Specific Question:   Employed in  healthcare setting    Answer:   No    Order Specific Question:   Pregnant    Answer:   No    Order Specific Question:   Has patient completed COVID vaccination(s) (2 doses of Pfizer/Moderna 1 dose of [employer])    Answer:   Yes    No results found for this or any previous visit (from the past 24 hour(s)). No results found.  ED Clinical Impression  1. Adjustment insomnia   2. Grief reaction   3. Upper respiratory tract infection, unspecified type   4. Encounter for laboratory testing  for COVID-19 virus   5. Elevated blood pressure reading      ED Assessment/Plan  1.  Insomnia.  Suspect due from acute grief reaction.  She has no evidence of a psychiatric emergency.  No suicidal, homicidal ideation, apparent auditory visual hallucinations.  Will send home with Vistaril.  2.  URI.  Covid test sent.  She will be a candidate for monoclonal antibody infusion based on BMI.  Flonase, saline nasal irrigation, Mucinex.  3.  Elevated blood pressure. Pt hypertensive today. . Pt has no historical evidence of end organ damage. Pt denies any CNS type sx such as HA, visual changes, focal paresis, or new onset seizure activity. Pt denies any CV sx such as CP, dyspnea, palpitations, pedal edema, tearing pain radiating to back or abd. Pt denied any renal sx such as anuria or hematuria.  Has had elevated blood pressure readings in the past several visits, suspect it is exacerbated by lack of sleep recently.  She has a blood pressure cuff at home.  She will measure her blood pressure once a day, keep a log of it, follow-up with her primary care provider within a week, will bring her blood pressure cuff and log with her to that visit.  Discussed with her that she may need to be started on medication.  Hypertensive emergency ER return precautions given.  Discussed labs, MDM, treatment plan, and plan for follow-up with patient. Discussed sn/sx that should prompt return to the ED. patient agrees with plan.    Meds ordered this encounter  Medications  . fluticasone (FLONASE) 50 MCG/ACT nasal spray    Sig: Place 2 sprays into both nostrils daily.    Dispense:  16 g    Refill:  0  . hydrOXYzine (ATARAX/VISTARIL) 50 MG tablet    Sig: Take 1 tablet (50 mg total) by mouth every 6 (six) hours as needed for anxiety. May take 2 tabs at night    Dispense:  30 tablet    Refill:  1    *This clinic note was created using [employer]. Therefore, there may be occasional mistakes despite careful proofreading.   ?    Melynda Ripple, MD 05/13/20 1530

## 2020-05-13 NOTE — Discharge Instructions (Addendum)
Decrease your salt intake. diet and exercise will lower your blood pressure significantly.  I suspect that your blood pressure is also elevated because of the lack of sleep and stress that you have been under recently.  It is important to keep your blood pressure under good control, as having a elevated blood pressure for prolonged periods of time significantly increases your risk of stroke, heart attacks, kidney damage, eye damage, and other problems. Measure your blood pressure once a day, preferably at the same time every day. Keep a log of this and bring it to your next doctor's appointment.  Bring your blood pressure cuff as well.  Return immediately to the ER if you start having chest pain, headache, problems seeing, problems talking, problems walking, if you feel like you're about to pass out, if you do pass out, if you have a seizure, or for any other concerns.  Flonase, saline nasal irrigation, Mucinex for the nasal congestion and scratchy throat.  Your Covid test will be back in 24 hours.  We will contact you if it is positive and help arrange a monoclonal antibody appointment.  Go to www.goodrx.com to look up your medications. This will give you a list of where you can find your prescriptions at the most affordable prices. Or ask the pharmacist what the cash price is, or if they have any other discount programs available to help make your medication more affordable. This can be less expensive than what you would pay with insurance.

## 2020-05-14 ENCOUNTER — Telehealth: Payer: Self-pay

## 2020-05-14 NOTE — Telephone Encounter (Signed)
Copied from [place] [id number]. Topic: General - Inquiry >> May 14, 2020 11:26 AM Lennox Solders wrote: Reason for CRM: lida with [employer] in [place] is calling and her manager  brandy would like to schedule a time to call dr Wynetta Emery to discuss pt FMLA

## 2020-05-19 NOTE — Telephone Encounter (Signed)
Called and LVM for Brandy asking for her to please return my call. Wanted to see if there was anything I could help her with as Dr. Wynetta Emery is seeing patients.

## 2020-05-22 ENCOUNTER — Encounter: Payer: Self-pay | Admitting: Family Medicine

## 2020-05-22 ENCOUNTER — Ambulatory Visit (INDEPENDENT_AMBULATORY_CARE_PROVIDER_SITE_OTHER): Payer: BC Managed Care – PPO | Admitting: Family Medicine

## 2020-05-22 ENCOUNTER — Other Ambulatory Visit: Payer: Self-pay

## 2020-05-22 VITALS — BP 144/88 | HR 85 | Ht 63.0 in | Wt 199.0 lb

## 2020-05-22 DIAGNOSIS — R350 Frequency of micturition: Secondary | ICD-10-CM

## 2020-05-22 DIAGNOSIS — E1159 Type 2 diabetes mellitus with other circulatory complications: Secondary | ICD-10-CM | POA: Insufficient documentation

## 2020-05-22 DIAGNOSIS — K529 Noninfective gastroenteritis and colitis, unspecified: Secondary | ICD-10-CM | POA: Diagnosis not present

## 2020-05-22 DIAGNOSIS — I10 Essential (primary) hypertension: Secondary | ICD-10-CM | POA: Diagnosis not present

## 2020-05-22 DIAGNOSIS — F339 Major depressive disorder, recurrent, unspecified: Secondary | ICD-10-CM

## 2020-05-22 LAB — UA/M W/RFLX CULTURE, ROUTINE
Bilirubin, UA: NEGATIVE
Glucose, UA: NEGATIVE
Ketones, UA: NEGATIVE
Leukocytes,UA: NEGATIVE
Nitrite, UA: NEGATIVE
Protein,UA: NEGATIVE
RBC, UA: NEGATIVE
Specific Gravity, UA: 1.015 (ref 1.005–1.030)
Urobilinogen, Ur: 0.2 mg/dL (ref 0.2–1.0)
pH, UA: 5 (ref 5.0–7.5)

## 2020-05-22 LAB — MICROALBUMIN, URINE WAIVED
Creatinine, Urine Waived: 100 mg/dL (ref 10–300)
Microalb, Ur Waived: 10 mg/L (ref 0–19)
Microalb/Creat Ratio: <30 mg/g (ref <30)

## 2020-05-22 MED ORDER — MIRTAZAPINE 15 MG PO TABS: 15.0000 mg | ORAL_TABLET | Freq: Every day | ORAL | 3 refills | Status: DC

## 2020-05-22 MED ORDER — LOSARTAN POTASSIUM 25 MG PO TABS: 25.0000 mg | ORAL_TABLET | Freq: Every day | ORAL | 3 refills | Status: DC

## 2020-05-22 MED ORDER — QUETIAPINE FUMARATE 50 MG PO TABS
50.0000 mg | ORAL_TABLET | Freq: Every day | ORAL | 1 refills | Status: DC
Start: 2020-05-22 — End: 2020-10-01

## 2020-05-22 NOTE — Progress Notes (Signed)
BP (!) 144/88   Pulse 85   Ht 5\' 3"  (1.6 m)   Wt 199 lb (90.3 kg)   LMP 05/09/2014 (Approximate)   SpO2 96%   BMI 35.25 kg/m    Subjective:    Patient ID: Kendra Espinoza, female    DOB: 1960-10-19, 59 y.o.   MRN: [id number]  HPI: Kendra Espinoza is a 59 y.o. female  Chief Complaint  Patient presents with  . Diarrhea    Needs doctors note for missing work due to chronic diarrhea. Seen slight blood Wednesday night when wiping. Has not seen any in the toilet. Was in Urgent Care- Bp was elevated . Brought readings from home.  Marland Kitchen Hypertension    Follow up.   . Depression    Greiving the loss of her niece from 3 weeks ago- she ws like a child of her own.   Stomach has been doing about the same. She notes that it has been really bad the past couple of days. She lost her niece and has been very upset. She continues with chronic diarrhea, but hasn't had any today.   HYPERTENSION Hypertension status: uncontrolled  Satisfied with current treatment? no Duration of hypertension: chronic BP monitoring frequency:  not checking Previous BP meds: none Aspirin: no Recurrent headaches: no Visual changes: no Palpitations: no Dyspnea: no Chest pain: no Lower extremity edema: no Dizzy/lightheaded: no  DEPRESSION Mood status: exacerbated Satisfied with current treatment?: no Symptom severity: severe  Psychotherapy/counseling: no  Previous psychiatric medications: zoloft, paxil Depressed mood: yes Anxious mood: yes Anhedonia: no Significant weight loss or gain: no Insomnia: yes hard to fall asleep Fatigue: yes Feelings of worthlessness or guilt: no Impaired concentration/indecisiveness: no Suicidal ideations: no Hopelessness: no Crying spells: no Depression screen [facility] 2/9 05/22/2020 12/04/2019 06/28/2019 03/15/2019 01/02/2019  Decreased Interest 3 - 1 1 1   Down, Depressed, Hopeless 3 1 1 1 1   PHQ - 2 Score 6 1 2 2 2   Altered sleeping 3 3 2 3 2   Tired, decreased energy 3 3 2 3 2     Change in appetite 3 3 3 3 2   Feeling bad or failure about yourself  0 0 1 2 0  Trouble concentrating 2 3 2 3 2   Moving slowly or fidgety/restless 0 0 0 0 1  Suicidal thoughts 0 0 0 0 0  PHQ-9 Score 17 13 12 16 11   Difficult doing work/chores Somewhat difficult Somewhat difficult - - -     Relevant past medical, surgical, family and social history reviewed and updated as indicated. Interim medical history since our last visit reviewed. Allergies and medications reviewed and updated.  Review of Systems  Constitutional: Negative.   Respiratory: Negative.   Cardiovascular: Negative.   Gastrointestinal: Negative.   Musculoskeletal: Negative.   Skin: Negative.   Psychiatric/Behavioral: Positive for dysphoric mood. Negative for agitation, behavioral problems, confusion, decreased concentration, hallucinations, self-injury, sleep disturbance and suicidal ideas. The patient is nervous/anxious. The patient is not hyperactive.     Per HPI unless specifically indicated above     Objective:    BP (!) 144/88   Pulse 85   Ht 5\' 3"  (1.6 m)   Wt 199 lb (90.3 kg)   LMP 05/09/2014 (Approximate)   SpO2 96%   BMI 35.25 kg/m   Wt Readings from Last 3 Encounters:  05/22/20 199 lb (90.3 kg)  05/13/20 194 lb (88 kg)  04/29/20 194 lb (88 kg)    Physical Exam Vitals and nursing note  reviewed.  Constitutional:      General: She is not in acute distress.    Appearance: Normal appearance. She is not ill-appearing, toxic-appearing or diaphoretic.  HENT:     Head: Normocephalic and atraumatic.     Right Ear: External ear normal.     Left Ear: External ear normal.     Nose: Nose normal.     Mouth/Throat:     Mouth: Mucous membranes are moist.     Pharynx: Oropharynx is clear.  Eyes:     General: No scleral icterus.       Right eye: No discharge.        Left eye: No discharge.     Extraocular Movements: Extraocular movements intact.     Conjunctiva/sclera: Conjunctivae normal.      Pupils: Pupils are equal, round, and reactive to light.  Cardiovascular:     Rate and Rhythm: Normal rate and regular rhythm.     Pulses: Normal pulses.     Heart sounds: Normal heart sounds. No murmur heard.  No friction rub. No gallop.   Pulmonary:     Effort: Pulmonary effort is normal. No respiratory distress.     Breath sounds: Normal breath sounds. No stridor. No wheezing, rhonchi or rales.  Chest:     Chest wall: No tenderness.  Musculoskeletal:        General: Normal range of motion.     Cervical back: Normal range of motion and neck supple.  Skin:    General: Skin is warm and dry.     Capillary Refill: Capillary refill takes less than 2 seconds.     Coloration: Skin is not jaundiced or pale.     Findings: No bruising, erythema, lesion or rash.  Neurological:     General: No focal deficit present.     Mental Status: She is alert and oriented to person, place, and time. Mental status is at baseline.  Psychiatric:        Mood and Affect: Mood normal. Affect is tearful.        Behavior: Behavior normal.        Thought Content: Thought content normal.        Judgment: Judgment normal.     Results for orders placed or performed during the hospital encounter of 05/13/20  SARS CORONAVIRUS 2 (TAT 6-24 HRS) Nasopharyngeal Nasopharyngeal Swab   Specimen: Nasopharyngeal Swab  Result Value Ref Range   SARS Coronavirus 2 NEGATIVE NEGATIVE      Assessment & Plan:   Problem List Items Addressed This Visit      Cardiovascular and Mediastinum   Primary hypertension - Primary    Not under good control. Will start losartan and recheck 1 month. Call with any concerns.       Relevant Medications   losartan (COZAAR) 25 MG tablet   Other Relevant Orders   Microalbumin, Urine Waived     Digestive   Chronic diarrhea    Thought to be IBS. Acting up with the death of her niece. Note for out of work given. Will start rememeron for mood and recheck 1 month. Call with any concerns.          Other   Depression, recurrent ([place])     Acting up with the death of her niece. Will start rememeron for mood and recheck 1 month. Call with any concerns.       Relevant Medications   mirtazapine (REMERON) 15 MG tablet    Other Visit Diagnoses  Frequency of urination       Will check UA. Call with any concerns. Continue to monitor.    Relevant Orders   UA/M w/rflx Culture, Routine       Follow up plan: Return in about 4 weeks (around 06/19/2020).

## 2020-05-22 NOTE — Assessment & Plan Note (Signed)
Thought to be IBS. Acting up with the death of her niece. Note for out of work given. Will start rememeron for mood and recheck 1 month. Call with any concerns.

## 2020-05-22 NOTE — Assessment & Plan Note (Signed)
Not under good control. Will start losartan and recheck 1 month. Call with any concerns.

## 2020-05-22 NOTE — Assessment & Plan Note (Signed)
Acting up with the death of her niece. Will start rememeron for mood and recheck 1 month. Call with any concerns.

## 2020-05-26 ENCOUNTER — Telehealth: Payer: Self-pay

## 2020-05-26 NOTE — Telephone Encounter (Signed)
Copied from [place] [id number]. Topic: General - Other >> May 26, 2020  3:59 PM Hinda Lenis D wrote: [facility] / Gerrit Heck. [phone] returning the call from [place] about PT FMLA paperwork / please advise

## 2020-05-28 ENCOUNTER — Encounter: Payer: Self-pay | Admitting: Family Medicine

## 2020-06-05 ENCOUNTER — Other Ambulatory Visit: Payer: Self-pay

## 2020-06-05 ENCOUNTER — Ambulatory Visit
Admission: RE | Admit: 2020-06-05 | Discharge: 2020-06-05 | Disposition: A | Discharge status: Home / Self Care | Payer: BC Managed Care – PPO | Source: Ambulatory Visit | Attending: Family Medicine | Admitting: Family Medicine

## 2020-06-05 VITALS — BP 137/99 | HR 74 | Temp 98.5°F | Resp 16

## 2020-06-05 DIAGNOSIS — R1032 Left lower quadrant pain: Secondary | ICD-10-CM | POA: Diagnosis not present

## 2020-06-05 DIAGNOSIS — K529 Noninfective gastroenteritis and colitis, unspecified: Secondary | ICD-10-CM

## 2020-06-05 MED ORDER — ONDANSETRON HCL 4 MG PO TABS: 4.0000 mg | ORAL_TABLET | Freq: Three times a day (TID) | ORAL | 0 refills | Status: DC | PRN

## 2020-06-05 MED ORDER — CIPROFLOXACIN HCL 500 MG PO TABS: 500.0000 mg | ORAL_TABLET | Freq: Two times a day (BID) | ORAL | 0 refills | Status: DC

## 2020-06-05 MED ORDER — METRONIDAZOLE 500 MG PO TABS: 500.0000 mg | ORAL_TABLET | Freq: Three times a day (TID) | ORAL | 0 refills | Status: AC

## 2020-06-05 NOTE — Discharge Instructions (Signed)
Medication as prescribed.  Follow up with GI.  Take care  Dr. Lacinda Axon

## 2020-06-05 NOTE — ED Triage Notes (Signed)
Pt is here with abdominal pain that started 3 days ago, pt has an appt for a CT scan on 06/22/2020, Pt states she has a hx of abdominal pain. Pt has not taken any meds to relieve discomfort.

## 2020-06-05 NOTE — ED Provider Notes (Signed)
[facility]    CSN: [id number] Arrival date & time: 06/05/20  1235  History   Chief Complaint Chief Complaint  Patient presents with  . (APPT 1PM)Abdominal pain   HPI  59 year old female presents with the above complaint.  Patient has ongoing issues with chronic diarrhea and chronic abdominal pain.  She states that her symptoms have been worsening over the past 3 days.  She reports worsening abdominal pain as well as worsening diarrhea.  Also noted nausea. She has not taken any medication regarding this.  No documented fever.  She localizes the pain primarily to the left side of the abdomen.  Pain currently 8/10 in severity.  Patient states that she has an upcoming ultrasound.  She is followed by GI.  Patient is concerned given her worsening symptoms.  No other complaints or concerns at this time.  Past Medical History:  Diagnosis Date  . Cancer ([place])    skin  . Migraines    about 1x/month since starting meds    Patient Active Problem List   Diagnosis Date Noted  . Primary hypertension 05/22/2020  . Dizziness 03/17/2020  . Chronic diarrhea 01/06/2020  . Special screening for malignant neoplasms, colon   . Polyp of colon   . Depression, recurrent ([place]) 04/10/2019  . Obesity 03/15/2019  . Difficulty walking 09/17/2018  . Ataxia 09/13/2018  . Headache disorder 09/13/2018  . Migraine 07/31/2018  . Insomnia 06/24/2018    Past Surgical History:  Procedure Laterality Date  . CESAREAN SECTION    . COLONOSCOPY WITH PROPOFOL N/A 06/11/2019   Procedure: COLONOSCOPY WITH BIOPSIES;  Surgeon: Virgel Manifold, MD;  Location: [place];  Service: Endoscopy;  Laterality: N/A;  . ESOPHAGOGASTRODUODENOSCOPY (EGD) WITH PROPOFOL N/A 11/22/2019   Procedure: ESOPHAGOGASTRODUODENOSCOPY (EGD) WITH PROPOFOL;  Surgeon: Virgel Manifold, MD;  Location: [place];  Service: Endoscopy;  Laterality: N/A;  . POLYPECTOMY N/A 06/11/2019   Procedure:  POLYPECTOMY;  Surgeon: Virgel Manifold, MD;  Location: [place];  Service: Endoscopy;  Laterality: N/A;  . skin cancer removal    . TONSILLECTOMY      OB History   No obstetric history on file.      Home Medications    Prior to Admission medications   Medication Sig Start Date End Date Taking? Authorizing Provider  ciprofloxacin (CIPRO) 500 MG tablet Take 1 tablet (500 mg total) by mouth 2 (two) times daily. 06/05/20   Coral Spikes, DO  fluticasone (FLONASE) 50 MCG/ACT nasal spray Place 2 sprays into both nostrils daily. 05/13/20   Melynda Ripple, MD  hydrOXYzine (ATARAX/VISTARIL) 50 MG tablet Take 1 tablet (50 mg total) by mouth every 6 (six) hours as needed for anxiety. May take 2 tabs at night 05/13/20   Melynda Ripple, MD  hyoscyamine (LEVSIN SL) 0.125 MG SL tablet Place under the tongue every 4 (four) hours. 04/29/20   [provider]  losartan (COZAAR) 25 MG tablet Take 1 tablet (25 mg total) by mouth daily. 05/22/20   Johnson, Megan P, DO  metroNIDAZOLE (FLAGYL) 500 MG tablet Take 1 tablet (500 mg total) by mouth 3 (three) times daily for 7 days. 06/05/20 06/12/20  Coral Spikes, DO  mirtazapine (REMERON) 15 MG tablet Take 1 tablet (15 mg total) by mouth at bedtime. 05/22/20   Johnson, Megan P, DO  ondansetron (ZOFRAN) 4 MG tablet Take 1 tablet (4 mg total) by mouth every 8 (eight) hours as needed for nausea or vomiting. 06/05/20  Porfirio Bollier G, DO  QUEtiapine (SEROQUEL) 50 MG tablet Take 1 tablet (50 mg total) by mouth daily. Patient taking 1/2 of pill at bed time (25 mg total) 05/22/20   Johnson, Megan P, DO  topiramate (TOPAMAX) 25 MG tablet Take 1 tablet (25 mg total) by mouth 2 (two) times daily. 04/24/20   Johnson, Megan P, DO  albuterol (VENTOLIN HFA) 108 (90 Base) MCG/ACT inhaler Inhale 2 puffs into the lungs every 6 (six) hours as needed for wheezing or shortness of breath. 04/29/20 05/13/20  Venita Lick, NP    Family History Family  History  Problem Relation Age of Onset  . Heart disease Mother   . Hypertension Mother   . Stroke Mother   . Heart attack Father 11  . Heart disease Father   . Hypertension Father   . Drug abuse Sister   . Hodgkin's lymphoma Brother   . Heart attack Maternal Grandmother   . Heart attack Maternal Grandfather   . Heart attack Paternal Grandfather     Social History Social History   Tobacco Use  . Smoking status: Never Smoker  . Smokeless tobacco: Never Used  Vaping Use  . Vaping Use: Never used  Substance Use Topics  . Alcohol use: Not Currently    Comment: occasionally  . Drug use: No     Allergies   Codeine   Review of Systems Review of Systems  Constitutional: Negative for fever.  Gastrointestinal: Positive for abdominal pain and diarrhea.   Physical Exam Triage Vital Signs ED Triage Vitals  Enc Vitals Group     BP 06/05/20 1343 (!) 137/99     Pulse Rate 06/05/20 1343 74     Resp 06/05/20 1343 16     Temp 06/05/20 1343 98.5 F (36.9 C)     Temp Source 06/05/20 1343 Oral     SpO2 06/05/20 1343 99 %     Weight --      Height --      Head Circumference --      Peak Flow --      Pain Score 06/05/20 1341 8     Pain Loc --      Pain Edu? --      Excl. in [place]? --    Updated Vital Signs BP (!) 137/99 (BP Location: Left Arm)   Pulse 74   Temp 98.5 F (36.9 C) (Oral)   Resp 16   LMP 05/09/2014 (Approximate)   SpO2 99%   Visual Acuity Right Eye Distance:   Left Eye Distance:   Bilateral Distance:    Right Eye Near:   Left Eye Near:    Bilateral Near:     Physical Exam Vitals and nursing note reviewed.  Constitutional:      General: She is not in acute distress.    Appearance: Normal appearance. She is not ill-appearing.  HENT:     Head: Normocephalic and atraumatic.  Eyes:     General:        Right eye: No discharge.        Left eye: No discharge.     Conjunctiva/sclera: Conjunctivae normal.  Cardiovascular:     Rate and Rhythm: Normal  rate and regular rhythm.     Heart sounds: No murmur heard.   Pulmonary:     Effort: Pulmonary effort is normal.     Breath sounds: Normal breath sounds. No wheezing or rales.  Abdominal:     General: There is no distension.  Palpations: Abdomen is soft.     Comments: Tenderness to palpation in the left lower quadrant.  Neurological:     Mental Status: She is alert.  Psychiatric:        Mood and Affect: Mood normal.        Behavior: Behavior normal.    UC Treatments / Results  Labs (all labs ordered are listed, but only abnormal results are displayed) Labs Reviewed - No data to display  EKG   Radiology No results found.  Procedures Procedures (including critical care time)  Medications Ordered in UC Medications - No data to display  Initial Impression / Assessment and Plan / UC Course  I have reviewed the triage vital signs and the nursing notes.  Pertinent labs & imaging results that were available during my care of the patient were reviewed by me and considered in my medical decision making (see chart for details).    59 year old female presents with abdominal pain and diarrhea.  Worsening pain and diarrhea in the setting of chronic issues.  She is tender in the left lower quadrant on exam.  Discussed imaging versus empiric treatment for diverticulitis versus watchful waiting and follow-up with GI.  Patient elected for empiric treatment for diverticulitis.  Treating with Cipro and Flagyl.  Zofran as needed.  Final Clinical Impressions(s) / UC Diagnoses   Final diagnoses:  LLQ abdominal pain  Chronic diarrhea     Discharge Instructions     Medication as prescribed.  Follow up with GI.  Take care  Dr. Lacinda Axon    ED Prescriptions    Medication Sig Dispense Auth. Provider   ciprofloxacin (CIPRO) 500 MG tablet Take 1 tablet (500 mg total) by mouth 2 (two) times daily. 14 tablet Aster Screws G, DO   metroNIDAZOLE (FLAGYL) 500 MG tablet Take 1 tablet (500 mg  total) by mouth 3 (three) times daily for 7 days. 21 tablet Meria Crilly G, DO   ondansetron (ZOFRAN) 4 MG tablet Take 1 tablet (4 mg total) by mouth every 8 (eight) hours as needed for nausea or vomiting. 20 tablet Coral Spikes, DO     PDMP not reviewed this encounter.   Coral Spikes, [place] 06/05/20 [phone]

## 2020-06-08 ENCOUNTER — Telehealth: Payer: Self-pay | Admitting: Family Medicine

## 2020-06-08 NOTE — Telephone Encounter (Signed)
Called and spoke with Lida. She states that they are concerned with the patient's absences and want to speak directly with the doctor to discuss this. I asked Lida if there was anything I could help with or answer and she said that it would be best for the doctor to answer their questions regarding the patient's FMLA. I advised Lida that Dr. Wynetta Emery was seeing patients and that she would not be done until 5 pm. Lida states that she gets off work at 4 pm and asked if the doctor could call her back tomorrow morning after she is done with patients. I stated that I would send this message to Dr. Wynetta Emery to let her know and make her aware.

## 2020-06-08 NOTE — Telephone Encounter (Signed)
Called to speak with the nurse regarding questions on the [facility] request.  CB# [phone]

## 2020-06-08 NOTE — Telephone Encounter (Signed)
Please get permission from patient that I can talk to her boss.

## 2020-06-08 NOTE — Telephone Encounter (Signed)
Called pt per Dr Wynetta Emery, no answer left vm to return call

## 2020-06-09 ENCOUNTER — Telehealth: Payer: BC Managed Care – PPO | Admitting: Nurse Practitioner

## 2020-06-09 DIAGNOSIS — R109 Unspecified abdominal pain: Secondary | ICD-10-CM

## 2020-06-09 NOTE — Telephone Encounter (Signed)
Called pt no answer left vm 

## 2020-06-09 NOTE — Telephone Encounter (Signed)
Pt called back and gave verbal permission to speak with her boss Lida

## 2020-06-09 NOTE — Progress Notes (Signed)
Based on what you shared with me it looks like you have chronic diarrhea,that should be evaluated in a face to face office visit. According tio your chart, you were seen in the Emergency department on 06/05/20 with the same complaint and they asked you to follow up with your GI doctor. There is nothing I can do for you  in this evisit.   in comments you stated having trouble sleeping and this not something I can address in an evisit.   NOTE: If you entered your credit card information for this eVisit, you will not be charged. You may see a "hold" on your card for the $35 but that hold will drop off and you will not have a charge processed.  If you are having a true medical emergency please call 911.     For an urgent face to face visit, [place] has four urgent care centers for your convenience:   . [facility]    [phone]                  Get Driving Directions  [address], [place] [address] . 10 am to 8 pm Monday-Friday . 12 pm to 8 pm Saturday-Sunday   . [facility] at [place]                  Get Driving Directions  [address], [place], [place] [address] . 8 am to 8 pm Monday-Friday . 9 am to 6 pm Saturday . 11 am to 6 pm Sunday   . [facility] at [place]                  Get Driving Directions   [address] [place], [place] [address] . 8 am to 8 pm Monday-Friday . 8 am to 4 pm Saturday-Sunday    . [facility] at [place]                    Get Driving Directions  [phone]  [address] [place], [place] [address]  . Monday-Friday, 12 PM to 6 PM    Your e-visit answers were reviewed by a board certified advanced clinical practitioner to complete your personal care plan.  Thank you for using e-Visits.

## 2020-06-10 NOTE — Telephone Encounter (Signed)
Called back by the HR office at [employer] [employer]. Patient has used up her FMLA time and is looking to use donated time. Discussed with them that her diarrhea is chronic and that she has been referred to GI.

## 2020-06-10 NOTE — Telephone Encounter (Signed)
Called and [facility] for Kendra Espinoza- will try her again when in the office on Monday.

## 2020-06-19 ENCOUNTER — Ambulatory Visit (INDEPENDENT_AMBULATORY_CARE_PROVIDER_SITE_OTHER): Payer: BC Managed Care – PPO | Admitting: Family Medicine

## 2020-06-19 ENCOUNTER — Other Ambulatory Visit: Payer: Self-pay

## 2020-06-19 ENCOUNTER — Encounter: Payer: Self-pay | Admitting: Family Medicine

## 2020-06-19 VITALS — BP 119/79 | HR 77 | Temp 98.6°F | Ht 63.9 in | Wt 200.8 lb

## 2020-06-19 DIAGNOSIS — K529 Noninfective gastroenteritis and colitis, unspecified: Secondary | ICD-10-CM

## 2020-06-19 DIAGNOSIS — G43909 Migraine, unspecified, not intractable, without status migrainosus: Secondary | ICD-10-CM

## 2020-06-19 DIAGNOSIS — F339 Major depressive disorder, recurrent, unspecified: Secondary | ICD-10-CM | POA: Diagnosis not present

## 2020-06-19 DIAGNOSIS — I10 Essential (primary) hypertension: Secondary | ICD-10-CM | POA: Diagnosis not present

## 2020-06-19 MED ORDER — MIRTAZAPINE 30 MG PO TABS
ORAL_TABLET | ORAL | 3 refills | Status: DC
Start: 2020-06-19 — End: 2020-10-01

## 2020-06-19 MED ORDER — TOPIRAMATE 25 MG PO TABS: 25.0000 mg | ORAL_TABLET | Freq: Two times a day (BID) | ORAL | 1 refills | Status: DC

## 2020-06-19 MED ORDER — LOSARTAN POTASSIUM 25 MG PO TABS
25.0000 mg | ORAL_TABLET | Freq: Every day | ORAL | 1 refills | Status: DC
Start: 2020-06-19 — End: 2020-10-01

## 2020-06-19 NOTE — Assessment & Plan Note (Signed)
Still not under good control. To see GI next week. Continue to follow with GI.

## 2020-06-19 NOTE — Assessment & Plan Note (Signed)
Not under good control. Will increase her remeron and recheck 4-6 weeks, if still no better, may need psychiatry referral.

## 2020-06-19 NOTE — Progress Notes (Signed)
BP 119/79   Pulse 77   Temp 98.6 F (37 C) (Oral)   Ht 5' 3.9" (1.623 m)   Wt 200 lb 12.8 oz (91.1 kg)   LMP 05/09/2014 (Approximate)   SpO2 98%   BMI 34.58 kg/m    Subjective:    Patient ID: Kendra Espinoza, female    DOB: 1960-10-16, 59 y.o.   MRN: [id number]  HPI: Kendra Espinoza is a 59 y.o. female  Chief Complaint  Patient presents with  . Hypertension  . Depression   HYPERTENSION Hypertension status: better  Satisfied with current treatment? yes Duration of hypertension: chronic BP monitoring frequency:  not checking BP medication side effects:  no Medication compliance: excellent compliance Previous BP meds: losartan Aspirin: no Recurrent headaches: no Visual changes: no Palpitations: no Dyspnea: no Chest pain: no Lower extremity edema: no Dizzy/lightheaded: no  DEPRESSION Mood status: uncontrolled Satisfied with current treatment?: yes Symptom severity: moderate  Duration of current treatment : chronic Side effects: no Medication compliance: excellent compliance Psychotherapy/counseling: no  Previous psychiatric medications: mirtazapine Depressed mood: yes Anxious mood: yes Anhedonia: no Significant weight loss or gain: no Insomnia: yes hard to fall and stay asleep Fatigue: yes Feelings of worthlessness or guilt: no Impaired concentration/indecisiveness: no Suicidal ideations: no Hopelessness: no Crying spells: no Depression screen [facility] 2/9 06/19/2020 05/22/2020 12/04/2019 06/28/2019 03/15/2019  Decreased Interest 3 3 - 1 1  Down, Depressed, Hopeless 2 3 1 1 1   PHQ - 2 Score 5 6 1 2 2   Altered sleeping 3 3 3 2 3   Tired, decreased energy 3 3 3 2 3   Change in appetite 2 3 3 3 3   Feeling bad or failure about yourself  0 0 0 1 2  Trouble concentrating 3 2 3 2 3   Moving slowly or fidgety/restless 3 0 0 0 0  Suicidal thoughts 0 0 0 0 0  PHQ-9 Score 19 17 13 12 16   Difficult doing work/chores - Somewhat difficult Somewhat difficult - -    Relevant  past medical, surgical, family and social history reviewed and updated as indicated. Interim medical history since our last visit reviewed. Allergies and medications reviewed and updated.  Review of Systems  Constitutional: Negative.   Respiratory: Negative.   Cardiovascular: Negative.   Gastrointestinal: Negative.   Musculoskeletal: Negative.   Skin: Negative.   Neurological: Negative.   Psychiatric/Behavioral: Positive for dysphoric mood and sleep disturbance. Negative for agitation, behavioral problems, confusion, decreased concentration, hallucinations, self-injury and suicidal ideas. The patient is nervous/anxious. The patient is not hyperactive.     Per HPI unless specifically indicated above     Objective:    BP 119/79   Pulse 77   Temp 98.6 F (37 C) (Oral)   Ht 5' 3.9" (1.623 m)   Wt 200 lb 12.8 oz (91.1 kg)   LMP 05/09/2014 (Approximate)   SpO2 98%   BMI 34.58 kg/m   Wt Readings from Last 3 Encounters:  06/19/20 200 lb 12.8 oz (91.1 kg)  05/22/20 199 lb (90.3 kg)  05/13/20 194 lb (88 kg)    Physical Exam Vitals and nursing note reviewed.  Constitutional:      General: She is not in acute distress.    Appearance: Normal appearance. She is not ill-appearing, toxic-appearing or diaphoretic.  HENT:     Head: Normocephalic and atraumatic.     Right Ear: External ear normal.     Left Ear: External ear normal.     Nose: Nose normal.  Mouth/Throat:     Mouth: Mucous membranes are moist.     Pharynx: Oropharynx is clear.  Eyes:     General: No scleral icterus.       Right eye: No discharge.        Left eye: No discharge.     Extraocular Movements: Extraocular movements intact.     Conjunctiva/sclera: Conjunctivae normal.     Pupils: Pupils are equal, round, and reactive to light.  Cardiovascular:     Rate and Rhythm: Normal rate and regular rhythm.     Pulses: Normal pulses.     Heart sounds: Normal heart sounds. No murmur heard.  No friction rub. No  gallop.   Pulmonary:     Effort: Pulmonary effort is normal. No respiratory distress.     Breath sounds: Normal breath sounds. No stridor. No wheezing, rhonchi or rales.  Chest:     Chest wall: No tenderness.  Musculoskeletal:        General: Normal range of motion.     Cervical back: Normal range of motion and neck supple.  Skin:    General: Skin is warm and dry.     Capillary Refill: Capillary refill takes less than 2 seconds.     Coloration: Skin is not jaundiced or pale.     Findings: No bruising, erythema, lesion or rash.  Neurological:     General: No focal deficit present.     Mental Status: She is alert and oriented to person, place, and time. Mental status is at baseline.  Psychiatric:        Mood and Affect: Mood normal.        Behavior: Behavior normal.        Thought Content: Thought content normal.        Judgment: Judgment normal.     Results for orders placed or performed in visit on 05/22/20  UA/M w/rflx Culture, Routine   Specimen: Urine   Urine  Result Value Ref Range   Specific Gravity, UA 1.015 1.005 - 1.030   pH, UA 5.0 5.0 - 7.5   Color, UA Yellow Yellow   Appearance Ur Clear Clear   Leukocytes,UA Negative Negative   Protein,UA Negative Negative/Trace   Glucose, UA Negative Negative   Ketones, UA Negative Negative   RBC, UA Negative Negative   Bilirubin, UA Negative Negative   Urobilinogen, Ur 0.2 0.2 - 1.0 mg/dL   Nitrite, UA Negative Negative  Microalbumin, Urine Waived  Result Value Ref Range   Microalb, Ur Waived 10 0 - 19 mg/L   Creatinine, Urine Waived 100 10 - 300 mg/dL   Microalb/Creat Ratio <30 <30 mg/g      Assessment & Plan:   Problem List Items Addressed This Visit      Cardiovascular and Mediastinum   Migraine   Relevant Medications   mirtazapine (REMERON) 30 MG tablet   topiramate (TOPAMAX) 25 MG tablet   losartan (COZAAR) 25 MG tablet   Primary hypertension - Primary    Under good control on current regimen. Continue  current regimen. Continue to monitor. Call with any concerns. Refills given. Labs drawn today.       Relevant Medications   losartan (COZAAR) 25 MG tablet   Other Relevant Orders   Basic metabolic panel     Digestive   Chronic diarrhea    Still not under good control. To see GI next week. Continue to follow with GI.         Other  Depression, recurrent ([place])    Not under good control. Will increase her remeron and recheck 4-6 weeks, if still no better, may need psychiatry referral.       Relevant Medications   mirtazapine (REMERON) 30 MG tablet       Follow up plan: Return 4-6 weeks.

## 2020-06-19 NOTE — Assessment & Plan Note (Signed)
Under good control on current regimen. Continue current regimen. Continue to monitor. Call with any concerns. Refills given. Labs drawn today.   

## 2020-06-20 LAB — BASIC METABOLIC PANEL
BUN/Creatinine Ratio: 12 (ref 12–28)
BUN: 12 mg/dL (ref 8–27)
CO2: 20 mmol/L (ref 20–29)
Calcium: 9.3 mg/dL (ref 8.7–10.3)
Chloride: 107 mmol/L — ABNORMAL HIGH (ref 96–106)
Creatinine, Ser: 1.03 mg/dL — ABNORMAL HIGH (ref 0.57–1.00)
GFR calc Af Amer: 68 mL/min/{1.73_m2} (ref >59)
GFR calc non Af Amer: 59 mL/min/{1.73_m2} — ABNORMAL LOW (ref >59)
Glucose: 121 mg/dL — ABNORMAL HIGH (ref 65–99)
Potassium: 4.4 mmol/L (ref 3.5–5.2)
Sodium: 142 mmol/L (ref 134–144)

## 2020-07-08 ENCOUNTER — Encounter: Payer: Self-pay | Admitting: Family Medicine

## 2020-07-16 ENCOUNTER — Other Ambulatory Visit: Payer: Self-pay

## 2020-07-16 ENCOUNTER — Encounter: Payer: Self-pay | Admitting: Family Medicine

## 2020-07-16 ENCOUNTER — Ambulatory Visit (INDEPENDENT_AMBULATORY_CARE_PROVIDER_SITE_OTHER): Payer: BC Managed Care – PPO | Admitting: Family Medicine

## 2020-07-16 VITALS — BP 128/89 | HR 75 | Temp 98.4°F | Wt 201.8 lb

## 2020-07-16 DIAGNOSIS — K529 Noninfective gastroenteritis and colitis, unspecified: Secondary | ICD-10-CM

## 2020-07-16 DIAGNOSIS — Z23 Encounter for immunization: Secondary | ICD-10-CM

## 2020-07-16 DIAGNOSIS — R109 Unspecified abdominal pain: Secondary | ICD-10-CM | POA: Diagnosis not present

## 2020-07-16 MED ORDER — DICYCLOMINE HCL 10 MG PO CAPS: 10.0000 mg | ORAL_CAPSULE | Freq: Three times a day (TID) | ORAL | 3 refills | Status: DC

## 2020-07-16 NOTE — Progress Notes (Signed)
BP 128/89   Pulse 75   Temp 98.4 F (36.9 C)   Wt 201 lb 12.8 oz (91.5 kg)   LMP 05/09/2014 (Approximate)   BMI 34.75 kg/m    Subjective:    Patient ID: Kendra Espinoza, female    DOB: 1961-02-23, 59 y.o.   MRN: [id number]  HPI: Kendra Espinoza is a 59 y.o. female  Chief Complaint  Patient presents with  . bowel concerns    Pt states last week she was having stomach cramps, and diarrhea. Pt states stool was green color. Pt is currently still having diarrhea and stomach cramps.    Had green stools last week for about 3 days, then it seems like it resolved. She had bad cramps associated with it. She has not followed up with her GI doctor. She is otherwise feeling well with no other concerns or complaints at this time.   Relevant past medical, surgical, family and social history reviewed and updated as indicated. Interim medical history since our last visit reviewed. Allergies and medications reviewed and updated.  Review of Systems  Constitutional: Negative.   Respiratory: Negative.   Cardiovascular: Negative.   Gastrointestinal: Positive for diarrhea. Negative for abdominal distention, abdominal pain, anal bleeding, blood in stool, constipation, nausea, rectal pain and vomiting.  Musculoskeletal: Negative.   Skin: Negative.   Psychiatric/Behavioral: Negative.     Per HPI unless specifically indicated above     Objective:    BP 128/89   Pulse 75   Temp 98.4 F (36.9 C)   Wt 201 lb 12.8 oz (91.5 kg)   LMP 05/09/2014 (Approximate)   BMI 34.75 kg/m   Wt Readings from Last 3 Encounters:  07/16/20 201 lb 12.8 oz (91.5 kg)  06/19/20 200 lb 12.8 oz (91.1 kg)  05/22/20 199 lb (90.3 kg)    Physical Exam Vitals and nursing note reviewed.  Constitutional:      General: She is not in acute distress.    Appearance: Normal appearance. She is not ill-appearing, toxic-appearing or diaphoretic.  HENT:     Head: Normocephalic and atraumatic.     Right Ear: External ear normal.      Left Ear: External ear normal.     Nose: Nose normal.     Mouth/Throat:     Mouth: Mucous membranes are moist.     Pharynx: Oropharynx is clear.  Eyes:     General: No scleral icterus.       Right eye: No discharge.        Left eye: No discharge.     Extraocular Movements: Extraocular movements intact.     Conjunctiva/sclera: Conjunctivae normal.     Pupils: Pupils are equal, round, and reactive to light.  Cardiovascular:     Rate and Rhythm: Normal rate and regular rhythm.     Pulses: Normal pulses.     Heart sounds: Normal heart sounds. No murmur heard. No friction rub. No gallop.   Pulmonary:     Effort: Pulmonary effort is normal. No respiratory distress.     Breath sounds: Normal breath sounds. No stridor. No wheezing, rhonchi or rales.  Chest:     Chest wall: No tenderness.  Musculoskeletal:        General: Normal range of motion.     Cervical back: Normal range of motion and neck supple.  Skin:    General: Skin is warm and dry.     Capillary Refill: Capillary refill takes less than 2 seconds.  Coloration: Skin is not jaundiced or pale.     Findings: No bruising, erythema, lesion or rash.  Neurological:     General: No focal deficit present.     Mental Status: She is alert and oriented to person, place, and time. Mental status is at baseline.  Psychiatric:        Mood and Affect: Mood normal.        Behavior: Behavior normal.        Thought Content: Thought content normal.        Judgment: Judgment normal.     Results for orders placed or performed in visit on [id number]  Basic metabolic panel  Result Value Ref Range   Glucose 121 (H) 65 - 99 mg/dL   BUN 12 8 - 27 mg/dL   Creatinine, Ser 1.03 (H) 0.57 - 1.00 mg/dL   GFR calc non Af Amer 59 (L) >59 mL/min/1.73   GFR calc Af Amer 68 >59 mL/min/1.73   BUN/Creatinine Ratio 12 12 - 28   Sodium 142 134 - 144 mmol/L   Potassium 4.4 3.5 - 5.2 mmol/L   Chloride 107 (H) 96 - 106 mmol/L   CO2 20 20 - 29 mmol/L    Calcium 9.3 8.7 - 10.3 mg/dL      Assessment & Plan:   Problem List Items Addressed This Visit      Digestive   Chronic diarrhea - Primary    In exacerbation. Will check stool studies. Encouraged follow up with her GI doctor. Will start bentyl to help with abdominal cramping. Continue to monitor.       Relevant Orders   Ova and parasite examination   Stool Culture   Fecal leukocytes   Stool C-Diff Toxin Assay   Fecal occult blood, imunochemical([facility])    Other Visit Diagnoses    Abdominal pain, unspecified abdominal location       Will check stool studies. Encouraged follow up with her GI doctor. Will start bentyl to help with abdominal cramping. Continue to monitor.    Relevant Medications   dicyclomine (BENTYL) 10 MG capsule   Other Relevant Orders   Ova and parasite examination   Stool Culture   Fecal leukocytes   Stool C-Diff Toxin Assay   Fecal occult blood, imunochemical([facility])   Need for influenza vaccination       Flu shot given today.   Relevant Orders   Flu Vaccine QUAD 6+ mos PF IM (Fluarix Quad PF) (Completed)       Follow up plan: Return if symptoms worsen or fail to improve.

## 2020-07-20 ENCOUNTER — Encounter: Payer: Self-pay | Admitting: Family Medicine

## 2020-07-20 NOTE — Assessment & Plan Note (Signed)
In exacerbation. Will check stool studies. Encouraged follow up with her GI doctor. Will start bentyl to help with abdominal cramping. Continue to monitor.

## 2020-07-24 ENCOUNTER — Ambulatory Visit: Payer: BC Managed Care – PPO | Admitting: Family Medicine

## 2020-08-20 ENCOUNTER — Telehealth: Payer: BC Managed Care – PPO | Admitting: Family Medicine

## 2020-08-20 ENCOUNTER — Telehealth (INDEPENDENT_AMBULATORY_CARE_PROVIDER_SITE_OTHER): Payer: Self-pay | Admitting: Family Medicine

## 2020-08-20 ENCOUNTER — Encounter: Payer: Self-pay | Admitting: Family Medicine

## 2020-08-20 VITALS — BP 124/92 | Temp 99.0°F

## 2020-08-20 DIAGNOSIS — J069 Acute upper respiratory infection, unspecified: Secondary | ICD-10-CM

## 2020-08-20 MED ORDER — HYDROCOD POLST-CPM POLST ER 10-8 MG/5ML PO SUER: 5.0000 mL | Freq: Two times a day (BID) | ORAL | 0 refills | Status: DC | PRN

## 2020-08-20 MED ORDER — PREDNISONE 10 MG PO TABS: 10.0000 mg | ORAL_TABLET | Freq: Every day | ORAL | 0 refills | Status: DC

## 2020-08-20 MED ORDER — BENZONATATE 200 MG PO CAPS: 200.0000 mg | ORAL_CAPSULE | Freq: Two times a day (BID) | ORAL | 0 refills | Status: DC | PRN

## 2020-08-20 NOTE — Progress Notes (Signed)
BP (!) 124/92   Temp 99 F (37.2 C)   LMP 05/09/2014 (Approximate)    Subjective:    Patient ID: Kendra Espinoza, female    DOB: 03-27-1961, 60 y.o.   MRN: [id number]  HPI: Kendra Espinoza is a 60 y.o. female  Chief Complaint  Patient presents with  . Cough    Patient has been coughing for about 5 days . States her throat is very scratchy. Body aches and chills.   . Diarrhea    Patient has been having loose stools. Tested for covid on Monday and was negative.    UPPER RESPIRATORY TRACT INFECTION Duration: Sunday Worst symptom: cough and sore throat Fever: yes Cough: yes Shortness of breath: no Wheezing: no Chest pain: no Chest tightness: yes Chest congestion: yes Nasal congestion: yes Runny nose: yes Post nasal drip: yes Sneezing: no Sore throat: yes Swollen glands: no Sinus pressure: no Headache: yes Face pain: no Toothache: no Ear pain: no  Ear pressure: no  Eyes red/itching:no Eye drainage/crusting: no  Vomiting: no Rash: no Fatigue: yes Sick contacts: no Strep contacts: no  Context: worse Recurrent sinusitis: no Relief with OTC cold/cough medications: no  Treatments attempted: none   Relevant past medical, surgical, family and social history reviewed and updated as indicated. Interim medical history since our last visit reviewed. Allergies and medications reviewed and updated.  Review of Systems  Constitutional: Positive for chills, diaphoresis, fatigue and fever. Negative for activity change, appetite change and unexpected weight change.  HENT: Positive for congestion, postnasal drip, rhinorrhea, sore throat and voice change. Negative for dental problem, drooling, ear discharge, ear pain, facial swelling, hearing loss, mouth sores, nosebleeds, sinus pressure, sinus pain, sneezing, tinnitus and trouble swallowing.   Respiratory: Positive for cough and chest tightness. Negative for apnea, choking, shortness of breath, wheezing and stridor.    Cardiovascular: Negative.   Gastrointestinal: Positive for diarrhea. Negative for abdominal distention, abdominal pain, anal bleeding, blood in stool, constipation, nausea, rectal pain and vomiting.  Musculoskeletal: Negative.   Neurological: Negative.   Psychiatric/Behavioral: Negative.     Per HPI unless specifically indicated above     Objective:    BP (!) 124/92   Temp 99 F (37.2 C)   LMP 05/09/2014 (Approximate)   Wt Readings from Last 3 Encounters:  07/16/20 201 lb 12.8 oz (91.5 kg)  06/19/20 200 lb 12.8 oz (91.1 kg)  05/22/20 199 lb (90.3 kg)    Physical Exam Vitals and nursing note reviewed.  Constitutional:      General: She is not in acute distress.    Appearance: Normal appearance. She is not ill-appearing, toxic-appearing or diaphoretic.  HENT:     Head: Normocephalic and atraumatic.     Right Ear: External ear normal.     Left Ear: External ear normal.     Nose: Nose normal.     Mouth/Throat:     Mouth: Mucous membranes are moist.     Pharynx: Oropharynx is clear.  Eyes:     General: No scleral icterus.       Right eye: No discharge.        Left eye: No discharge.     Conjunctiva/sclera: Conjunctivae normal.     Pupils: Pupils are equal, round, and reactive to light.  Pulmonary:     Effort: Pulmonary effort is normal. No respiratory distress.     Comments: Speaking in full sentences, significant hacking cough Musculoskeletal:        General: Normal range  of motion.     Cervical back: Normal range of motion.  Skin:    Coloration: Skin is not jaundiced or pale.     Findings: No bruising, erythema, lesion or rash.  Neurological:     Mental Status: She is alert and oriented to person, place, and time. Mental status is at baseline.  Psychiatric:        Mood and Affect: Mood normal.        Behavior: Behavior normal.        Thought Content: Thought content normal.        Judgment: Judgment normal.     Results for orders placed or performed in visit  on 08/65/78  Basic metabolic panel  Result Value Ref Range   Glucose 121 (H) 65 - 99 mg/dL   BUN 12 8 - 27 mg/dL   Creatinine, Ser 1.03 (H) 0.57 - 1.00 mg/dL   GFR calc non Af Amer 59 (L) >59 mL/min/1.73   GFR calc Af Amer 68 >59 mL/min/1.73   BUN/Creatinine Ratio 12 12 - 28   Sodium 142 134 - 144 mmol/L   Potassium 4.4 3.5 - 5.2 mmol/L   Chloride 107 (H) 96 - 106 mmol/L   CO2 20 20 - 29 mmol/L   Calcium 9.3 8.7 - 10.3 mg/dL      Assessment & Plan:   Problem List Items Addressed This Visit   None   Visit Diagnoses    Upper respiratory tract infection, unspecified type    -  Primary   Concern for being tested too soon. Will treat with prednisone, tussionex and tessalon. Call if not better tomorrow and will reswab.       Follow up plan: Return if symptoms worsen or fail to improve.   . This visit was completed via MyChart due to the restrictions of the COVID-19 pandemic. All issues as above were discussed and addressed. Physical exam was done as above through visual confirmation on MyChart. If it was felt that the patient should be evaluated in the office, they were directed there. The patient verbally consented to this visit. . Location of the patient: home . Location of the provider: work . Those involved with this call:  . Provider: Park Liter, DO . CMA: Louanna Raw, [place] . Front Desk/Registration: Jill Side  . Time spent on call: 15 minutes with patient face to face via video conference. More than 50% of this time was spent in counseling and coordination of care. 23 minutes total spent in review of patient's record and preparation of their chart.

## 2020-08-21 ENCOUNTER — Other Ambulatory Visit: Payer: Self-pay

## 2020-08-21 ENCOUNTER — Encounter: Payer: Self-pay | Admitting: Family Medicine

## 2020-08-21 DIAGNOSIS — R059 Cough, unspecified: Secondary | ICD-10-CM

## 2020-08-23 LAB — SARS-COV-2, NAA 2 DAY TAT

## 2020-08-23 LAB — NOVEL CORONAVIRUS, NAA: SARS-CoV-2, NAA: NOT DETECTED

## 2020-08-24 ENCOUNTER — Encounter: Payer: Self-pay | Admitting: Family Medicine

## 2020-08-24 ENCOUNTER — Telehealth (INDEPENDENT_AMBULATORY_CARE_PROVIDER_SITE_OTHER): Payer: BC Managed Care – PPO | Admitting: Family Medicine

## 2020-08-24 VITALS — BP 156/103 | HR 79 | Temp 98.6°F | Wt 199.0 lb

## 2020-08-24 DIAGNOSIS — J069 Acute upper respiratory infection, unspecified: Secondary | ICD-10-CM | POA: Diagnosis not present

## 2020-08-24 MED ORDER — HYDROCOD POLST-CPM POLST ER 10-8 MG/5ML PO SUER: 5.0000 mL | Freq: Two times a day (BID) | ORAL | 0 refills | Status: DC | PRN

## 2020-08-24 NOTE — Progress Notes (Signed)
Virtual Visit via Video Note  I connected with Kendra Espinoza on 08/24/20 at 11:20 AM EST by a video enabled telemedicine application and verified that I am speaking with the correct person using two identifiers.  Location: Patient: home Provider: [facility]   I discussed the limitations of evaluation and management by telemedicine and the availability of in person appointments. The patient expressed understanding and agreed to proceed.  History of Present Illness:  UPPER RESPIRATORY TRACT INFECTION - previously seen 2/3 virtually for same, Rx with prednisone, tussionex, tessalon - COVID neg 2/4 - symptom onset 1/29 - cough, sore throat, fatigue.  Worst symptom: Fever: no, defervesced Cough: yes Shortness of breath: no Wheezing: no Chest pain: yes, with cough Chest tightness: no Chest congestion: yes Nasal congestion: no Runny nose: no Sneezing: yes Sore throat: yes Headache: yes Vomiting: no Fatigue: yes  Context: better Recurrent sinusitis: no Relief with OTC cold/cough medications: hasn't tried since starting steroids  Treatments attempted: Prednisone, tussionex, tessalon. Previously with nyquil, dayquil.    Observations/Objective:  Tired appearing, in NAD. Speaks in full sentences, no respiratory distress. Frequent hacking coughing.  Assessment and Plan:  Viral URI With mild-mod sx, slightly improved since starting steroids. COVID negative. Will continue current course, refill provided for tussionex. No current indication for abx. Reviewed emergency precautions. If still having trouble by next week, recommend in person appt for evaluation.     I discussed the assessment and treatment plan with the patient. The patient was provided an opportunity to ask questions and all were answered. The patient agreed with the plan and demonstrated an understanding of the instructions.   The patient was advised to call back or seek an in-person evaluation if the symptoms worsen or if  the condition fails to improve as anticipated.  I provided 8 minutes of non-face-to-face time during this encounter.   Myles Gip, DO

## 2020-08-24 NOTE — Patient Instructions (Signed)
You have a cold and it should start to get better about 10-14 days after it started.    Continue to take the steroids.  I sent a refill of your cough syrup.   Some other therapies you can try are: push fluids, rest, gargle warm salt water and use vaporizer or mist as needed.  Drinking warm liquids such as teas and soups can help with secretions and cough. A mist humidifier or vaporizer can work well to help with secretions and cough.  It is very important to clean the humidifier between use according to the instructions.    It was good to see you.  If you're still having trouble in the next week, come back and see [place].    Of course, if you start having trouble breathing, worsening fevers, vomiting and unable to hold down any fluids, or you have other concerns, don't hesitate to come back or go to the ED after hours.

## 2020-08-28 ENCOUNTER — Ambulatory Visit
Admission: RE | Admit: 2020-08-28 | Discharge: 2020-08-28 | Disposition: A | Discharge status: Home / Self Care | Payer: BC Managed Care – PPO | Attending: Family Medicine | Admitting: Family Medicine

## 2020-08-28 ENCOUNTER — Encounter: Payer: Self-pay | Admitting: Family Medicine

## 2020-08-28 ENCOUNTER — Telehealth (INDEPENDENT_AMBULATORY_CARE_PROVIDER_SITE_OTHER): Payer: BC Managed Care – PPO | Admitting: Family Medicine

## 2020-08-28 ENCOUNTER — Other Ambulatory Visit: Payer: Self-pay

## 2020-08-28 ENCOUNTER — Ambulatory Visit
Admission: RE | Admit: 2020-08-28 | Discharge: 2020-08-28 | Disposition: A | Discharge status: Home / Self Care | Payer: BC Managed Care – PPO | Source: Ambulatory Visit | Attending: Family Medicine | Admitting: Family Medicine

## 2020-08-28 VITALS — BP 148/89 | HR 84 | Temp 98.9°F

## 2020-08-28 DIAGNOSIS — R059 Cough, unspecified: Secondary | ICD-10-CM | POA: Insufficient documentation

## 2020-08-28 MED ORDER — DOXYCYCLINE HYCLATE 100 MG PO TABS: 100.0000 mg | ORAL_TABLET | Freq: Two times a day (BID) | ORAL | 0 refills | Status: DC

## 2020-08-28 NOTE — Patient Instructions (Signed)
Change in Prednisone dose: 6 tabs today, 5 tabs tomorrow, then decrease by 1 every day until gone

## 2020-08-28 NOTE — Progress Notes (Signed)
BP (!) 148/89   Pulse 84   Temp 98.9 F (37.2 C)   LMP 05/09/2014 (Approximate)    Subjective:    Patient ID: Kendra Espinoza, female    DOB: [birth year], 60 y.o.   MRN: [id number]  HPI: Kendra Espinoza is a 60 y.o. female  Chief Complaint  Patient presents with  . Cough    Pt states she has been dealing with these current symptoms for the past two weeks. Pt states she was tested Friday for COVID testing and it came back negative. Pt states she is still not feeling better and states she would like to figure out what's going on because she would like to get back to work.   . Headache  . Generalized Body Aches   UPPER RESPIRATORY TRACT INFECTION Duration: 2+ weeks Worst symptom: headache, cough, body aches Fever: yes Cough: no Shortness of breath: no Wheezing: no Chest pain: yes, with cough Chest tightness: yes Chest congestion: yes Nasal congestion: yes Runny nose: yes Post nasal drip: yes Sneezing: no Sore throat: no Swollen glands: no Sinus pressure: no Headache: yes Face pain: no Toothache: no Ear pain: no  Ear pressure: no  Eyes red/itching:no Eye drainage/crusting: no  Vomiting: no Rash: no Fatigue: yes Sick contacts: no Strep contacts: no  Context: worse Recurrent sinusitis: no Relief with OTC cold/cough medications: no  Treatments attempted: prednisone, cold/sinus, mucinex, anti-histamine, pseudoephedrine and cough syrup   Relevant past medical, surgical, family and social history reviewed and updated as indicated. Interim medical history since our last visit reviewed. Allergies and medications reviewed and updated.  Review of Systems  Constitutional: Positive for chills. Negative for activity change, appetite change, diaphoresis, fatigue, fever and unexpected weight change.  HENT: Positive for congestion and rhinorrhea. Negative for dental problem, drooling, ear discharge, ear pain, facial swelling, hearing loss, mouth sores, nosebleeds, postnasal drip,  sinus pressure, sinus pain, sneezing, sore throat, tinnitus, trouble swallowing and voice change.   Respiratory: Positive for cough, chest tightness and wheezing. Negative for apnea, choking, shortness of breath and stridor.   Cardiovascular: Negative.   Gastrointestinal: Negative.   Neurological: Negative.   Psychiatric/Behavioral: Negative.     Per HPI unless specifically indicated above     Objective:    BP (!) 148/89   Pulse 84   Temp 98.9 F (37.2 C)   LMP 05/09/2014 (Approximate)   Wt Readings from Last 3 Encounters:  08/24/20 199 lb (90.3 kg)  07/16/20 201 lb 12.8 oz (91.5 kg)  06/19/20 200 lb 12.8 oz (91.1 kg)    Physical Exam Vitals and nursing note reviewed.  Constitutional:      General: She is not in acute distress.    Appearance: Normal appearance. She is obese. She is ill-appearing. She is not toxic-appearing or diaphoretic.  HENT:     Head: Normocephalic and atraumatic.     Right Ear: External ear normal.     Left Ear: External ear normal.     Nose: Nose normal.     Mouth/Throat:     Mouth: Mucous membranes are moist.     Pharynx: Oropharynx is clear.  Eyes:     General: No scleral icterus.       Right eye: No discharge.        Left eye: No discharge.     Conjunctiva/sclera: Conjunctivae normal.     Pupils: Pupils are equal, round, and reactive to light.  Pulmonary:     Effort: Pulmonary effort is normal. No  respiratory distress.     Comments: Speaking in full sentences Musculoskeletal:        General: Normal range of motion.     Cervical back: Normal range of motion.  Skin:    Coloration: Skin is not jaundiced or pale.     Findings: No bruising, erythema, lesion or rash.  Neurological:     Mental Status: She is alert and oriented to person, place, and time. Mental status is at baseline.  Psychiatric:        Mood and Affect: Mood normal.        Behavior: Behavior normal.        Thought Content: Thought content normal.        Judgment: Judgment  normal.     Results for orders placed or performed in visit on 08/21/20  Novel Coronavirus, NAA ([facility])   Specimen: Nasopharyngeal(NP) swabs in vial transport medium  Result Value Ref Range   SARS-CoV-2, NAA Not Detected Not Detected  SARS-COV-2, NAA 2 DAY TAT  Result Value Ref Range   SARS-CoV-2, NAA 2 DAY TAT Performed       Assessment & Plan:   Problem List Items Addressed This Visit   None   Visit Diagnoses    Cough    -  Primary   Concern for pneumonia. Will treat with doxycycline and continue prednisone. Will obtain CXR. Recheck 1 week in person. Call with any concerns.    Relevant Orders   DG Chest 2 View       Follow up plan: Return in about 1 week (around 09/04/2020) for in person.    . This visit was completed via MyChart due to the restrictions of the COVID-19 pandemic. All issues as above were discussed and addressed. Physical exam was done as above through visual confirmation on MyChart. If it was felt that the patient should be evaluated in the office, they were directed there. The patient verbally consented to this visit. . Location of the patient: home . Location of the provider: work . Those involved with this call:  . Provider: Park Liter, DO . CMA: Vito Backers, CMA . Front Desk/Registration: Jill Side  . Time spent on call: 15 minutes with patient face to face via video conference. More than 50% of this time was spent in counseling and coordination of care. 23 minutes total spent in review of patient's record and preparation of their chart.

## 2020-09-04 ENCOUNTER — Ambulatory Visit: Payer: BC Managed Care – PPO | Admitting: Family Medicine

## 2020-09-04 ENCOUNTER — Other Ambulatory Visit: Payer: Self-pay

## 2020-09-04 ENCOUNTER — Encounter: Payer: Self-pay | Admitting: Family Medicine

## 2020-09-04 VITALS — BP 120/80 | HR 86 | Temp 97.9°F | Wt 204.0 lb

## 2020-09-04 DIAGNOSIS — R059 Cough, unspecified: Secondary | ICD-10-CM | POA: Diagnosis not present

## 2020-09-04 DIAGNOSIS — R8290 Unspecified abnormal findings in urine: Secondary | ICD-10-CM

## 2020-09-04 LAB — MICROSCOPIC EXAMINATION
Trichomonas, UA: NONE SEEN
Yeast, UA: NONE SEEN

## 2020-09-04 LAB — URINALYSIS, ROUTINE W REFLEX MICROSCOPIC
Bilirubin, UA: NEGATIVE
Glucose, UA: NEGATIVE
Ketones, UA: NEGATIVE
Nitrite, UA: NEGATIVE
Protein,UA: NEGATIVE
RBC, UA: NEGATIVE
Specific Gravity, UA: 1.02 (ref 1.005–1.030)
Urobilinogen, Ur: 0.2 mg/dL (ref 0.2–1.0)
pH, UA: 6 (ref 5.0–7.5)

## 2020-09-04 NOTE — Progress Notes (Signed)
BP 120/80   Pulse 86   Temp 97.9 F (36.6 C)   Wt 204 lb (92.5 kg)   LMP 05/09/2014 (Approximate)   SpO2 96%   BMI 35.13 kg/m    Subjective:    Patient ID: Kendra Espinoza, female    DOB: 1960/10/26, 60 y.o.   MRN: [id number]  HPI: Kendra Espinoza is a 60 y.o. female  Chief Complaint  Patient presents with  . Cough    Follow up, seems to be a little better but its still there   . Headache    Patient states she has been having headaches everyday, feels like pressure. Since she has been sick for about 3 weeks.    Has continued with cough and headache. She has had a lot of pressure in her head and has been feeling tired. She notes that she has been worn out really quickly. Coughing every few hours. No fevers, no chills, no CP, no wheezing. Doxycycline seems like it helped. Finished prednisone today. Still has a couple of days on her doxycyline. She has noticed a little cloudy urine. She is otherwise doing well. No other concerns at this time.   Relevant past medical, surgical, family and social history reviewed and updated as indicated. Interim medical history since our last visit reviewed. Allergies and medications reviewed and updated.  Review of Systems  Constitutional: Positive for fatigue. Negative for activity change, appetite change, chills, diaphoresis, fever and unexpected weight change.  HENT: Positive for sore throat. Negative for congestion, dental problem, drooling, ear discharge, ear pain, facial swelling, hearing loss, mouth sores, nosebleeds, postnasal drip, rhinorrhea, sinus pressure, sinus pain, sneezing, tinnitus, trouble swallowing and voice change.   Respiratory: Positive for cough and chest tightness. Negative for apnea, choking, shortness of breath, wheezing and stridor.   Cardiovascular: Negative.   Gastrointestinal: Negative.   Psychiatric/Behavioral: Negative.     Per HPI unless specifically indicated above     Objective:    BP 120/80   Pulse 86    Temp 97.9 F (36.6 C)   Wt 204 lb (92.5 kg)   LMP 05/09/2014 (Approximate)   SpO2 96%   BMI 35.13 kg/m   Wt Readings from Last 3 Encounters:  09/04/20 204 lb (92.5 kg)  08/24/20 199 lb (90.3 kg)  07/16/20 201 lb 12.8 oz (91.5 kg)    Physical Exam Vitals and nursing note reviewed.  Constitutional:      General: She is not in acute distress.    Appearance: Normal appearance. She is not ill-appearing, toxic-appearing or diaphoretic.  HENT:     Head: Normocephalic and atraumatic.     Right Ear: External ear normal.     Left Ear: External ear normal.     Nose: Nose normal.     Mouth/Throat:     Mouth: Mucous membranes are moist.     Pharynx: Oropharynx is clear.  Eyes:     General: No scleral icterus.       Right eye: No discharge.        Left eye: No discharge.     Extraocular Movements: Extraocular movements intact.     Conjunctiva/sclera: Conjunctivae normal.     Pupils: Pupils are equal, round, and reactive to light.  Cardiovascular:     Rate and Rhythm: Normal rate and regular rhythm.     Pulses: Normal pulses.     Heart sounds: Normal heart sounds. No murmur heard. No friction rub. No gallop.   Pulmonary:  Effort: Pulmonary effort is normal. No respiratory distress.     Breath sounds: Normal breath sounds. No stridor. No wheezing, rhonchi or rales.  Chest:     Chest wall: No tenderness.  Musculoskeletal:        General: Normal range of motion.     Cervical back: Normal range of motion and neck supple.  Skin:    General: Skin is warm and dry.     Capillary Refill: Capillary refill takes less than 2 seconds.     Coloration: Skin is not jaundiced or pale.     Findings: No bruising, erythema, lesion or rash.  Neurological:     General: No focal deficit present.     Mental Status: She is alert and oriented to person, place, and time. Mental status is at baseline.  Psychiatric:        Mood and Affect: Mood normal.        Behavior: Behavior normal.        Thought  Content: Thought content normal.        Judgment: Judgment normal.     Results for orders placed or performed in visit on 08/21/20  Novel Coronavirus, NAA ([facility])   Specimen: Nasopharyngeal(NP) swabs in vial transport medium  Result Value Ref Range   SARS-CoV-2, NAA Not Detected Not Detected  SARS-COV-2, NAA 2 DAY TAT  Result Value Ref Range   SARS-CoV-2, NAA 2 DAY TAT Performed       Assessment & Plan:   Problem List Items Addressed This Visit   None   Visit Diagnoses    Cough    -  Primary   Lungs clear today. Finish current regimen. Continue to monitor. Call with any concerns.    Relevant Orders   CBC with Differential/Platelet   Basic metabolic panel   Cloudy urine       Checking urine today. Await results.    Relevant Orders   Urinalysis, Routine w reflex microscopic       Follow up plan: Return March for physical with Dr. Neomia Dear.

## 2020-09-05 LAB — BASIC METABOLIC PANEL
BUN/Creatinine Ratio: 12 (ref 12–28)
BUN: 11 mg/dL (ref 8–27)
CO2: 23 mmol/L (ref 20–29)
Calcium: 9.1 mg/dL (ref 8.7–10.3)
Chloride: 102 mmol/L (ref 96–106)
Creatinine, Ser: 0.95 mg/dL (ref 0.57–1.00)
GFR calc Af Amer: 75 mL/min/{1.73_m2} (ref >59)
GFR calc non Af Amer: 65 mL/min/{1.73_m2} (ref >59)
Glucose: 98 mg/dL (ref 65–99)
Potassium: 3.5 mmol/L (ref 3.5–5.2)
Sodium: 140 mmol/L (ref 134–144)

## 2020-09-05 LAB — CBC WITH DIFFERENTIAL/PLATELET
Basophils Absolute: 0.1 10*3/uL (ref 0.0–0.2)
Basos: 1 %
EOS (ABSOLUTE): 0.2 10*3/uL (ref 0.0–0.4)
Eos: 2 %
Hematocrit: 44 % (ref 34.0–46.6)
Hemoglobin: 14.7 g/dL (ref 11.1–15.9)
Immature Grans (Abs): 0.1 10*3/uL (ref 0.0–0.1)
Immature Granulocytes: 1 %
Lymphocytes Absolute: 4.1 10*3/uL — ABNORMAL HIGH (ref 0.7–3.1)
Lymphs: 42 %
MCH: 29.6 pg (ref 26.6–33.0)
MCHC: 33.4 g/dL (ref 31.5–35.7)
MCV: 89 fL (ref 79–97)
Monocytes Absolute: 0.7 10*3/uL (ref 0.1–0.9)
Monocytes: 7 %
Neutrophils Absolute: 4.7 10*3/uL (ref 1.4–7.0)
Neutrophils: 47 %
Platelets: 230 10*3/uL (ref 150–450)
RBC: 4.96 x10E6/uL (ref 3.77–5.28)
RDW: 12.6 % (ref 11.7–15.4)
WBC: 9.8 10*3/uL (ref 3.4–10.8)

## 2020-09-25 ENCOUNTER — Encounter: Payer: Self-pay | Admitting: Family Medicine

## 2020-09-25 NOTE — Telephone Encounter (Signed)
Lvm to make apt.  

## 2020-09-25 NOTE — Telephone Encounter (Signed)
Pt called to sch an appt but there was nothing until Friday.  She wants to know if she can get in sooner or if Dr. Wynetta Emery can change her medication or up her dosage.  CB#  [phone]

## 2020-10-01 ENCOUNTER — Ambulatory Visit: Payer: BC Managed Care – PPO | Admitting: Nurse Practitioner

## 2020-10-01 ENCOUNTER — Encounter: Payer: Self-pay | Admitting: Nurse Practitioner

## 2020-10-01 ENCOUNTER — Other Ambulatory Visit: Payer: Self-pay

## 2020-10-01 VITALS — BP 125/82 | HR 87 | Temp 98.2°F | Wt 204.6 lb

## 2020-10-01 DIAGNOSIS — R7301 Impaired fasting glucose: Secondary | ICD-10-CM

## 2020-10-01 DIAGNOSIS — F5102 Adjustment insomnia: Secondary | ICD-10-CM | POA: Diagnosis not present

## 2020-10-01 DIAGNOSIS — E559 Vitamin D deficiency, unspecified: Secondary | ICD-10-CM

## 2020-10-01 DIAGNOSIS — R0683 Snoring: Secondary | ICD-10-CM | POA: Diagnosis not present

## 2020-10-01 DIAGNOSIS — E6609 Other obesity due to excess calories: Secondary | ICD-10-CM

## 2020-10-01 DIAGNOSIS — Z6835 Body mass index (BMI) 35.0-35.9, adult: Secondary | ICD-10-CM

## 2020-10-01 DIAGNOSIS — I10 Essential (primary) hypertension: Secondary | ICD-10-CM

## 2020-10-01 MED ORDER — LOSARTAN POTASSIUM 25 MG PO TABS
25.0000 mg | ORAL_TABLET | Freq: Every day | ORAL | 4 refills | Status: DC
Start: 2020-10-01 — End: 2021-04-06

## 2020-10-01 MED ORDER — QUETIAPINE FUMARATE 50 MG PO TABS
75.0000 mg | ORAL_TABLET | Freq: Every day | ORAL | 4 refills | Status: DC
Start: 2020-10-01 — End: 2021-04-06

## 2020-10-01 NOTE — Progress Notes (Signed)
BP 125/82   Pulse 87   Temp 98.2 F (36.8 C) (Oral)   Wt 204 lb 9.6 oz (92.8 kg)   LMP 05/09/2014 (Approximate)   SpO2 98%   BMI 35.23 kg/m    Subjective:    Patient ID: Kendra Espinoza, female    DOB: 1960-11-13, 60 y.o.   MRN: [id number]  HPI: Kendra Espinoza is a 60 y.o. female  Chief Complaint  Patient presents with  . Sleep Issues    Patient states she is taking medication to help with sleep, states she was taking 1/2 tablet and it wasn't help her and then she increased dosage to 1 tablet. Patient states she works third shift and states she has not had a full nights rest since Tuesday and states when she does get rest it is only 2 to 3 hrs of sleep.  . Medication Refill    Patient may need refill on Losartan medication.   HYPERTENSION Continues on Losartan 25 MG daily -- started 3 months ago.  Not a smoker. Hypertension status: controlled  Satisfied with current treatment? yes Duration of hypertension: chronic BP monitoring frequency: a few times a week BP range: 120/70's at home BP medication side effects:  no Medication compliance: good compliance Aspirin: no Recurrent headaches: no Visual changes: no Palpitations: no Dyspnea: no Chest pain: no Lower extremity edema: no Dizzy/lightheaded: no   INSOMNIA Continues on Seroquel 25 MG at night, was originally taking 50 MG but this made her too sleepy so they cut in 1/2.  Then over the past weeks has gone back up to a whole pill -- with this is only sleeping 2-3 hours.  Works third shift -- does things to make herself get tired. Duration: chronic Satisfied with sleep quality: yes Difficulty falling asleep: yes Difficulty staying asleep: yes Waking a few hours after sleep onset: yes Early morning awakenings: yes Daytime hypersomnolence: no Wakes feeling refreshed: no Good sleep hygiene: yes Apnea: unknown Snoring: yes -- her husband reports she snores Depressed/anxious mood: yes Recent stress: yes -- going  through a lot of stomach Restless legs/nocturnal leg cramps: no Chronic pain/arthritis: no History of sleep study: no Treatments attempted: Melatonin and Seroquel  Relevant past medical, surgical, family and social history reviewed and updated as indicated. Interim medical history since our last visit reviewed. Allergies and medications reviewed and updated.  Review of Systems  Constitutional: Negative for activity change, appetite change, diaphoresis, fatigue and fever.  Respiratory: Negative for cough, chest tightness and shortness of breath.   Cardiovascular: Negative for chest pain, palpitations and leg swelling.  Gastrointestinal: Negative.   Neurological: Negative.   Psychiatric/Behavioral: Positive for sleep disturbance. Negative for decreased concentration, self-injury and suicidal ideas. The patient is nervous/anxious.     Per HPI unless specifically indicated above     Objective:    BP 125/82   Pulse 87   Temp 98.2 F (36.8 C) (Oral)   Wt 204 lb 9.6 oz (92.8 kg)   LMP 05/09/2014 (Approximate)   SpO2 98%   BMI 35.23 kg/m   Wt Readings from Last 3 Encounters:  10/01/20 204 lb 9.6 oz (92.8 kg)  09/04/20 204 lb (92.5 kg)  08/24/20 199 lb (90.3 kg)    Physical Exam Vitals and nursing note reviewed.  Constitutional:      General: She is awake. She is not in acute distress.    Appearance: She is well-developed and well-groomed. She is obese. She is not ill-appearing.  HENT:  Head: Normocephalic.     Right Ear: Hearing normal.     Left Ear: Hearing normal.  Eyes:     General: Lids are normal.        Right eye: No discharge.        Left eye: No discharge.     Conjunctiva/sclera: Conjunctivae normal.     Pupils: Pupils are equal, round, and reactive to light.  Neck:     Thyroid: No thyromegaly.     Vascular: No carotid bruit.  Cardiovascular:     Rate and Rhythm: Normal rate and regular rhythm.     Heart sounds: Normal heart sounds. No murmur heard. No  gallop.   Pulmonary:     Effort: Pulmonary effort is normal. No accessory muscle usage or respiratory distress.     Breath sounds: Normal breath sounds.  Abdominal:     General: Bowel sounds are normal.     Palpations: Abdomen is soft. There is no hepatomegaly or splenomegaly.  Musculoskeletal:     Cervical back: Normal range of motion and neck supple.     Right lower leg: No edema.     Left lower leg: No edema.  Lymphadenopathy:     Cervical: No cervical adenopathy.  Skin:    General: Skin is warm and dry.  Neurological:     Mental Status: She is alert and oriented to person, place, and time.  Psychiatric:        Attention and Perception: Attention normal.        Mood and Affect: Mood normal.        Speech: Speech normal.        Behavior: Behavior normal. Behavior is cooperative.        Thought Content: Thought content normal.    Results for orders placed or performed in visit on 09/04/20  Microscopic Examination   Urine  Result Value Ref Range   WBC, UA 0-5 0 - 5 /hpf   RBC 0-2 0 - 2 /hpf   Epithelial Cells (non renal) 0-10 0 - 10 /hpf   Renal Epithel, UA 0-10 (A) None seen /hpf   Casts Present (A) None seen /lpf   Cast Type Hyaline casts N/A   Crystals Present (A) N/A   Crystal Type Calcium Oxalate N/A   Mucus, UA Present Not Estab.   Bacteria, UA Few None seen/Few   Yeast, UA None seen None seen   Trichomonas, UA None seen None seen  CBC with Differential/Platelet  Result Value Ref Range   WBC 9.8 3.4 - 10.8 x10E3/uL   RBC 4.96 3.77 - 5.28 x10E6/uL   Hemoglobin 14.7 11.1 - 15.9 g/dL   Hematocrit 44.0 34.0 - 46.6 %   MCV 89 79 - 97 fL   MCH 29.6 26.6 - 33.0 pg   MCHC 33.4 31.5 - 35.7 g/dL   RDW 12.6 11.7 - 15.4 %   Platelets 230 150 - 450 x10E3/uL   Neutrophils 47 Not Estab. %   Lymphs 42 Not Estab. %   Monocytes 7 Not Estab. %   Eos 2 Not Estab. %   Basos 1 Not Estab. %   Neutrophils Absolute 4.7 1.4 - 7.0 x10E3/uL   Lymphocytes Absolute 4.1 (H) 0.7 - 3.1  x10E3/uL   Monocytes Absolute 0.7 0.1 - 0.9 x10E3/uL   EOS (ABSOLUTE) 0.2 0.0 - 0.4 x10E3/uL   Basophils Absolute 0.1 0.0 - 0.2 x10E3/uL   Immature Granulocytes 1 Not Estab. %   Immature Grans (Abs) 0.1 0.0 -  0.1 [id number]  Basic metabolic panel  Result Value Ref Range   Glucose 98 65 - 99 mg/dL   BUN 11 8 - 27 mg/dL   Creatinine, Ser 0.95 0.57 - 1.00 mg/dL   GFR calc non Af Amer 65 >59 mL/min/1.73   GFR calc Af Amer 75 >59 mL/min/1.73   BUN/Creatinine Ratio 12 12 - 28   Sodium 140 134 - 144 mmol/L   Potassium 3.5 3.5 - 5.2 mmol/L   Chloride 102 96 - 106 mmol/L   CO2 23 20 - 29 mmol/L   Calcium 9.1 8.7 - 10.3 mg/dL  Urinalysis, Routine w reflex microscopic  Result Value Ref Range   Specific Gravity, UA 1.020 1.005 - 1.030   pH, UA 6.0 5.0 - 7.5   Color, UA Yellow Yellow   Appearance Ur Hazy (A) Clear   Leukocytes,UA Trace (A) Negative   Protein,UA Negative Negative/Trace   Glucose, UA Negative Negative   Ketones, UA Negative Negative   RBC, UA Negative Negative   Bilirubin, UA Negative Negative   Urobilinogen, Ur 0.2 0.2 - 1.0 mg/dL   Nitrite, UA Negative Negative   Microscopic Examination See below:       Assessment & Plan:   Problem List Items Addressed This Visit      Cardiovascular and Mediastinum   Primary hypertension - Primary    Chronic, ongoing.  BP at goal today with Losartan and tolerating well.  Recommend she monitor BP at least a few mornings a week at home and document.  DASH diet at home.  Continue current medication regimen and adjust as needed.  Labs today: CMP, TSH, lipid.  Return in April as scheduled.       Relevant Medications   losartan (COZAAR) 25 MG tablet   Other Relevant Orders   Comprehensive metabolic panel   Lipid Panel w/o Chol/HDL Ratio   TSH     Other   Insomnia    Chronic, ongoing with current worsening.  Will order neurology referral for sleep study, as suspect this may be underlying issue -- discussed at length with patient and  she would like to have this done as would prefer not to take medication.  At this time will increase Seroquel to 75 MG at night and if poor response may increase to 100 MG (2 tablets), discussed with patient and refills sent in.  Return in April as scheduled.      Relevant Orders   Ambulatory referral to Neurology   Obesity    BMI 35.23.  Recommended eating smaller high protein, low fat meals more frequently and exercising 30 mins a day 5 times a week with a goal of 10-15lb weight loss in the next 3 months. Patient voiced their understanding and motivation to adhere to these recommendations.        Other Visit Diagnoses    Snoring       Referral to neurology for sleep study.   Relevant Orders   Ambulatory referral to Neurology   Vitamin D deficiency       History fo low levels reported, check today and initiate supplement as needed.  Works third shift.   Relevant Orders   VITAMIN D 25 Hydroxy (Vit-D Deficiency, Fractures)       Follow up plan: Return in about 6 weeks (around 11/12/2020) for INSOMNIA.

## 2020-10-01 NOTE — Assessment & Plan Note (Signed)
Chronic, ongoing with current worsening.  Will order neurology referral for sleep study, as suspect this may be underlying issue -- discussed at length with patient and she would like to have this done as would prefer not to take medication.  At this time will increase Seroquel to 75 MG at night and if poor response may increase to 100 MG (2 tablets), discussed with patient and refills sent in.  Return in April as scheduled.

## 2020-10-01 NOTE — Patient Instructions (Signed)

## 2020-10-01 NOTE — Assessment & Plan Note (Signed)
Chronic, ongoing.  BP at goal today with Losartan and tolerating well.  Recommend she monitor BP at least a few mornings a week at home and document.  DASH diet at home.  Continue current medication regimen and adjust as needed.  Labs today: CMP, TSH, lipid.  Return in April as scheduled.

## 2020-10-01 NOTE — Assessment & Plan Note (Signed)
BMI 35.23.  Recommended eating smaller high protein, low fat meals more frequently and exercising 30 mins a day 5 times a week with a goal of 10-15lb weight loss in the next 3 months. Patient voiced their understanding and motivation to adhere to these recommendations.

## 2020-10-02 ENCOUNTER — Encounter: Payer: BC Managed Care – PPO | Admitting: Internal Medicine

## 2020-10-02 ENCOUNTER — Encounter: Payer: Self-pay | Admitting: Nurse Practitioner

## 2020-10-02 ENCOUNTER — Other Ambulatory Visit: Payer: Self-pay | Admitting: Nurse Practitioner

## 2020-10-02 DIAGNOSIS — E7800 Pure hypercholesterolemia, unspecified: Secondary | ICD-10-CM

## 2020-10-02 DIAGNOSIS — E559 Vitamin D deficiency, unspecified: Secondary | ICD-10-CM | POA: Insufficient documentation

## 2020-10-02 DIAGNOSIS — R7309 Other abnormal glucose: Secondary | ICD-10-CM

## 2020-10-02 LAB — COMPREHENSIVE METABOLIC PANEL
ALT: 26 IU/L (ref 0–32)
AST: 18 IU/L (ref 0–40)
Albumin/Globulin Ratio: 2 (ref 1.2–2.2)
Albumin: 4.7 g/dL (ref 3.8–4.8)
Alkaline Phosphatase: 114 IU/L (ref 44–121)
BUN/Creatinine Ratio: 14 (ref 12–28)
BUN: 11 mg/dL (ref 8–27)
Bilirubin Total: 0.6 mg/dL (ref 0.0–1.2)
CO2: 18 mmol/L — ABNORMAL LOW (ref 20–29)
Calcium: 9.6 mg/dL (ref 8.7–10.3)
Chloride: 102 mmol/L (ref 96–106)
Creatinine, Ser: 0.81 mg/dL (ref 0.57–1.00)
Globulin, Total: 2.4 g/dL (ref 1.5–4.5)
Glucose: 120 mg/dL — ABNORMAL HIGH (ref 65–99)
Potassium: 4 mmol/L (ref 3.5–5.2)
Sodium: 142 mmol/L (ref 134–144)
Total Protein: 7.1 g/dL (ref 6.0–8.5)
eGFR: 83 mL/min/{1.73_m2} (ref >59)

## 2020-10-02 LAB — LIPID PANEL W/O CHOL/HDL RATIO
Cholesterol, Total: 294 mg/dL — ABNORMAL HIGH (ref 100–199)
HDL: 47 mg/dL (ref >39)
LDL Chol Calc (NIH): 191 mg/dL — ABNORMAL HIGH (ref 0–99)
Triglycerides: 285 mg/dL — ABNORMAL HIGH (ref 0–149)
VLDL Cholesterol Cal: 56 mg/dL — ABNORMAL HIGH (ref 5–40)

## 2020-10-02 LAB — TSH: TSH: 2.78 u[IU]/mL (ref 0.450–4.500)

## 2020-10-02 LAB — VITAMIN D 25 HYDROXY (VIT D DEFICIENCY, FRACTURES): Vit D, 25-Hydroxy: 14.4 ng/mL — ABNORMAL LOW (ref 30.0–100.0)

## 2020-10-02 MED ORDER — CHOLECALCIFEROL 1.25 MG (50000 UT) PO TABS: 1.0000 | ORAL_TABLET | ORAL | 1 refills | Status: DC

## 2020-10-02 MED ORDER — ATORVASTATIN CALCIUM 10 MG PO TABS: 10.0000 mg | ORAL_TABLET | Freq: Every day | ORAL | 4 refills | Status: DC

## 2020-10-02 NOTE — Addendum Note (Signed)
Addended by: Marnee Guarneri T on: 10/02/2020 01:43 PM   Modules accepted: Orders

## 2020-10-02 NOTE — Progress Notes (Signed)
Contacted via [place] afternoon Gwendalynn, your labs have returned: - Sugar was a little elevated, had you ate before visit?  Previous sugar had been 98 and this one was 120.  Would benefit from A1c check for diabetes next visit, I was unable to add to labs from yesterday as they had not drawn tube for this. - Thyroid is normal - Vitamin D level is low.  I am going to send in a weekly dose of Vitamin D to help boost this up and we will recheck next visit.  This is good for bone and muscle health and may help fatigue. - Cholesterol levels were elevated, with LDL (bad cholesterol) at level I would usually start medication for.  Were you fasting?  If not I would like to recheck this on an outpatient lab visit only next week with you fasting.  Could you schedule this please.  Any questions? Keep being awesome!!  Thank you for allowing me to participate in your care. Kindest regards, Bonnee Zertuche

## 2020-10-08 ENCOUNTER — Encounter: Payer: Self-pay | Admitting: Family Medicine

## 2020-10-12 ENCOUNTER — Encounter: Payer: Self-pay | Admitting: Family Medicine

## 2020-10-12 ENCOUNTER — Telehealth (INDEPENDENT_AMBULATORY_CARE_PROVIDER_SITE_OTHER): Payer: BC Managed Care – PPO | Admitting: Family Medicine

## 2020-10-12 VITALS — BP 146/74 | Temp 99.0°F

## 2020-10-12 DIAGNOSIS — J069 Acute upper respiratory infection, unspecified: Secondary | ICD-10-CM | POA: Diagnosis not present

## 2020-10-12 MED ORDER — BENZONATATE 200 MG PO CAPS: 200.0000 mg | ORAL_CAPSULE | Freq: Two times a day (BID) | ORAL | 1 refills | Status: DC | PRN

## 2020-10-12 MED ORDER — PREDNISONE 50 MG PO TABS: 50.0000 mg | ORAL_TABLET | Freq: Every day | ORAL | 0 refills | Status: DC

## 2020-10-12 NOTE — Addendum Note (Signed)
Addended by: Georgina Peer on: 10/12/2020 03:31 PM   Modules accepted: Orders

## 2020-10-12 NOTE — Progress Notes (Signed)
BP (!) 146/74   Temp 99 F (37.2 C)   LMP 05/09/2014 (Approximate)    Subjective:    Patient ID: Kendra Espinoza, female    DOB: [birth year], 60 y.o.   MRN: [id number]  HPI: Kendra Espinoza is a 60 y.o. female  Chief Complaint  Patient presents with  . Cough    Patient states she has been coughing for about 5 days, has ST, runny nose and chills.    UPPER RESPIRATORY TRACT INFECTION Duration: 6 days Worst symptom: cough,  Fever: yes Cough: yes Shortness of breath: no Wheezing: yes Chest pain: yes, with cough Chest tightness: no Chest congestion: no Nasal congestion: yes Runny nose: no Post nasal drip: no Sneezing: no Sore throat: yes Swollen glands: no Sinus pressure: no Headache: no Face pain: no Toothache: no Ear pain: no  Ear pressure: no  Eyes red/itching:no Eye drainage/crusting: no  Vomiting: no Rash: no Fatigue: yes Sick contacts: yes Strep contacts: no  Context: stable Recurrent sinusitis: no Relief with OTC cold/cough medications: no  Treatments attempted: cold/sinus, mucinex, anti-histamine, pseudoephedrine and cough syrup   Relevant past medical, surgical, family and social history reviewed and updated as indicated. Interim medical history since our last visit reviewed. Allergies and medications reviewed and updated.  Review of Systems  Constitutional: Positive for fatigue and fever. Negative for activity change, appetite change, chills, diaphoresis and unexpected weight change.  HENT: Positive for congestion, sinus pressure and sore throat. Negative for dental problem, drooling, ear discharge, ear pain, facial swelling, hearing loss, mouth sores, nosebleeds, postnasal drip, rhinorrhea, sinus pain, sneezing, tinnitus, trouble swallowing and voice change.   Eyes: Negative.   Respiratory: Positive for cough and wheezing. Negative for apnea, choking, chest tightness, shortness of breath and stridor.   Cardiovascular: Negative.   Gastrointestinal:  Negative.   Psychiatric/Behavioral: Negative.     Per HPI unless specifically indicated above     Objective:    BP (!) 146/74   Temp 99 F (37.2 C)   LMP 05/09/2014 (Approximate)   Wt Readings from Last 3 Encounters:  10/01/20 204 lb 9.6 oz (92.8 kg)  09/04/20 204 lb (92.5 kg)  08/24/20 199 lb (90.3 kg)    Physical Exam Vitals and nursing note reviewed.  Pulmonary:     Effort: Pulmonary effort is normal. No respiratory distress.     Comments: Speaking in full sentences Neurological:     Mental Status: She is alert.  Psychiatric:        Mood and Affect: Mood normal.        Behavior: Behavior normal.        Thought Content: Thought content normal.        Judgment: Judgment normal.     Results for orders placed or performed in visit on 10/01/20  Comprehensive metabolic panel  Result Value Ref Range   Glucose 120 (H) 65 - 99 mg/dL   BUN 11 8 - 27 mg/dL   Creatinine, Ser 0.81 0.57 - 1.00 mg/dL   eGFR 83 >59 mL/min/1.73   BUN/Creatinine Ratio 14 12 - 28   Sodium 142 134 - 144 mmol/L   Potassium 4.0 3.5 - 5.2 mmol/L   Chloride 102 96 - 106 mmol/L   CO2 18 (L) 20 - 29 mmol/L   Calcium 9.6 8.7 - 10.3 mg/dL   Total Protein 7.1 6.0 - 8.5 g/dL   Albumin 4.7 3.8 - 4.8 g/dL   Globulin, Total 2.4 1.5 - 4.5 g/dL   Albumin/Globulin Ratio  2.0 1.2 - 2.2   Bilirubin Total 0.6 0.0 - 1.2 mg/dL   Alkaline Phosphatase 114 44 - 121 IU/L   AST 18 0 - 40 IU/L   ALT 26 0 - 32 IU/L  Lipid Panel w/o Chol/HDL Ratio  Result Value Ref Range   Cholesterol, Total 294 (H) 100 - 199 mg/dL   Triglycerides 285 (H) 0 - 149 mg/dL   HDL 47 >39 mg/dL   VLDL Cholesterol Cal 56 (H) 5 - 40 mg/dL   LDL Chol Calc (NIH) 191 (H) 0 - 99 mg/dL  TSH  Result Value Ref Range   TSH 2.780 0.450 - 4.500 uIU/mL  VITAMIN D 25 Hydroxy (Vit-D Deficiency, Fractures)  Result Value Ref Range   Vit D, 25-Hydroxy 14.4 (L) 30.0 - 100.0 ng/mL      Assessment & Plan:   Problem List Items Addressed This Visit    None   Visit Diagnoses    Upper respiratory tract infection, unspecified type    -  Primary   Will check for COVID- self-quarantine until results are back. Will treat with prednisone burst and tessalon perles. Call if not getting better or getting worse.   Relevant Orders   Novel Coronavirus, NAA ([facility])       Follow up plan: Return if symptoms worsen or fail to improve.   . This visit was completed via telephone due to the restrictions of the COVID-19 pandemic. All issues as above were discussed and addressed but no physical exam was performed. If it was felt that the patient should be evaluated in the office, they were directed there. The patient verbally consented to this visit. Patient was unable to complete an audio/visual visit due to Lack of equipment. Due to the catastrophic nature of the COVID-19 pandemic, this visit was done through audio contact only. . Location of the patient: home . Location of the provider: work . Those involved with this call:  . Provider: Park Liter, DO . CMA: Louanna Raw, [place] . Front Desk/Registration: Jill Side  . Time spent on call: 21 minutes on the phone discussing health concerns. 30 minutes total spent in review of patient's record and preparation of their chart.

## 2020-10-13 LAB — SARS-COV-2, NAA 2 DAY TAT

## 2020-10-13 LAB — NOVEL CORONAVIRUS, NAA: SARS-CoV-2, NAA: NOT DETECTED

## 2020-10-15 ENCOUNTER — Encounter: Payer: Self-pay | Admitting: Family Medicine

## 2020-10-15 ENCOUNTER — Other Ambulatory Visit: Payer: Self-pay

## 2020-10-15 ENCOUNTER — Ambulatory Visit (INDEPENDENT_AMBULATORY_CARE_PROVIDER_SITE_OTHER): Payer: BC Managed Care – PPO | Admitting: Family Medicine

## 2020-10-15 VITALS — BP 134/87 | HR 87 | Temp 98.0°F | Wt 202.0 lb

## 2020-10-15 DIAGNOSIS — B029 Zoster without complications: Secondary | ICD-10-CM

## 2020-10-15 MED ORDER — PREDNISONE 10 MG PO TABS: ORAL_TABLET | ORAL | 0 refills | Status: DC

## 2020-10-15 MED ORDER — VALACYCLOVIR HCL 1 G PO TABS: 1000.0000 mg | ORAL_TABLET | Freq: Two times a day (BID) | ORAL | 0 refills | Status: DC

## 2020-10-15 MED ORDER — TRIAMCINOLONE ACETONIDE 40 MG/ML IJ SUSP
40.0000 mg | Freq: Once | INTRAMUSCULAR | Status: AC
  Administered 2020-10-15: 40 mg via INTRAMUSCULAR

## 2020-10-15 NOTE — Progress Notes (Signed)
BP 134/87   Pulse 87   Temp 98 F (36.7 C)   Wt 202 lb (91.6 kg)   LMP 05/09/2014 (Approximate)   SpO2 99%   BMI 34.78 kg/m    Subjective:    Patient ID: Kendra Espinoza, female    DOB: January 16, 1961, 60 y.o.   MRN: [id number]  HPI: Kendra Espinoza is a 60 y.o. female  Chief Complaint  Patient presents with  . Rash    Patient states she noticed yesterday an area on the right side of her face that burns and is painful    RASH Duration:  yesterday  Location: neck  Itching: yes Burning: yes Redness: yes Oozing: no Scaling: yes Blisters: yes Painful: yes Fevers: no Change in detergents/soaps/personal care products: no Recent illness: no Recent travel:no History of same: no Context: worse Alleviating factors: nothing Treatments attempted:nothing Shortness of breath: no  Throat/tongue swelling: no Myalgias/arthralgias: no  Relevant past medical, surgical, family and social history reviewed and updated as indicated. Interim medical history since our last visit reviewed. Allergies and medications reviewed and updated.  Review of Systems  Constitutional: Negative.   Respiratory: Negative.   Cardiovascular: Negative.   Gastrointestinal: Negative.   Musculoskeletal: Negative.   Skin: Positive for rash. Negative for color change, pallor and wound.  Psychiatric/Behavioral: Negative.     Per HPI unless specifically indicated above     Objective:    BP 134/87   Pulse 87   Temp 98 F (36.7 C)   Wt 202 lb (91.6 kg)   LMP 05/09/2014 (Approximate)   SpO2 99%   BMI 34.78 kg/m   Wt Readings from Last 3 Encounters:  10/15/20 202 lb (91.6 kg)  10/01/20 204 lb 9.6 oz (92.8 kg)  09/04/20 204 lb (92.5 kg)    Physical Exam Vitals and nursing note reviewed.  Constitutional:      General: She is not in acute distress.    Appearance: Normal appearance. She is not ill-appearing, toxic-appearing or diaphoretic.  HENT:     Head: Normocephalic and atraumatic.     Right  Ear: External ear normal.     Left Ear: External ear normal.     Nose: Nose normal.     Mouth/Throat:     Mouth: Mucous membranes are moist.     Pharynx: Oropharynx is clear.  Eyes:     General: No scleral icterus.       Right eye: No discharge.        Left eye: No discharge.     Extraocular Movements: Extraocular movements intact.     Conjunctiva/sclera: Conjunctivae normal.     Pupils: Pupils are equal, round, and reactive to light.  Cardiovascular:     Rate and Rhythm: Normal rate and regular rhythm.     Pulses: Normal pulses.     Heart sounds: Normal heart sounds. No murmur heard. No friction rub. No gallop.   Pulmonary:     Effort: Pulmonary effort is normal. No respiratory distress.     Breath sounds: Normal breath sounds. No stridor. No wheezing, rhonchi or rales.  Chest:     Chest wall: No tenderness.  Musculoskeletal:        General: Normal range of motion.     Cervical back: Normal range of motion and neck supple.  Skin:    General: Skin is warm and dry.     Capillary Refill: Capillary refill takes less than 2 seconds.     Coloration: Skin is not  jaundiced or pale.     Findings: Rash (R side of her neck, vesicular rash) present. No bruising, erythema or lesion.  Neurological:     General: No focal deficit present.     Mental Status: She is alert and oriented to person, place, and time. Mental status is at baseline.  Psychiatric:        Mood and Affect: Mood normal.        Behavior: Behavior normal.        Thought Content: Thought content normal.        Judgment: Judgment normal.     Results for orders placed or performed in visit on 10/12/20  Novel Coronavirus, NAA ([facility])   Specimen: Saline  Result Value Ref Range   SARS-CoV-2, NAA Not Detected Not Detected  SARS-COV-2, NAA 2 DAY TAT  Result Value Ref Range   SARS-CoV-2, NAA 2 DAY TAT Performed       Assessment & Plan:   Problem List Items Addressed This Visit   None   Visit Diagnoses    Herpes  zoster without complication    -  Primary   Triamicinalone shot today, Valtrex and prednisone. Call if not getting better or getting worse. Out of work until it dries up.   Relevant Medications   triamcinolone acetonide (KENALOG-40) injection 40 mg (Completed)   valACYclovir (VALTREX) 1000 MG tablet       Follow up plan: Return if symptoms worsen or fail to improve.

## 2020-10-20 ENCOUNTER — Encounter: Payer: Self-pay | Admitting: Internal Medicine

## 2020-11-02 ENCOUNTER — Encounter: Payer: BC Managed Care – PPO | Admitting: Internal Medicine

## 2020-11-05 ENCOUNTER — Telehealth (INDEPENDENT_AMBULATORY_CARE_PROVIDER_SITE_OTHER): Payer: BC Managed Care – PPO | Admitting: Family Medicine

## 2020-11-05 ENCOUNTER — Encounter: Payer: Self-pay | Admitting: Family Medicine

## 2020-11-05 ENCOUNTER — Telehealth: Payer: Self-pay | Admitting: Family Medicine

## 2020-11-05 ENCOUNTER — Other Ambulatory Visit: Payer: Self-pay

## 2020-11-05 VITALS — BP 144/97 | Temp 99.6°F | Wt 199.0 lb

## 2020-11-05 DIAGNOSIS — J069 Acute upper respiratory infection, unspecified: Secondary | ICD-10-CM

## 2020-11-05 LAB — VERITOR FLU A/B WAIVED
Influenza A: NEGATIVE
Influenza B: NEGATIVE

## 2020-11-05 MED ORDER — BENZONATATE 200 MG PO CAPS
200.0000 mg | ORAL_CAPSULE | Freq: Two times a day (BID) | ORAL | 0 refills | Status: DC | PRN
Start: 2020-11-05 — End: 2021-03-16

## 2020-11-05 MED ORDER — HYDROCOD POLST-CPM POLST ER 10-8 MG/5ML PO SUER: 5.0000 mL | Freq: Two times a day (BID) | ORAL | 0 refills | Status: DC | PRN

## 2020-11-05 NOTE — Telephone Encounter (Signed)
Patient notified

## 2020-11-05 NOTE — Telephone Encounter (Signed)
Please let her know that her flu was negative- We'll let her know what her COVID comes back as ASAP. Rest and plenty of fluids now. Note for work on her chart.

## 2020-11-05 NOTE — Progress Notes (Signed)
BP (!) 144/97   Temp 99.6 F (37.6 C)   Wt 199 lb (90.3 kg)   LMP 05/09/2014 (Approximate)   SpO2 96%   BMI 34.27 kg/m    Subjective:    Patient ID: Kendra Espinoza, female    DOB: 04-Jul-1961, 60 y.o.   MRN: [id number]  HPI: Kendra Espinoza is a 60 y.o. female  Chief Complaint  Patient presents with  . Cough    Patient states she has been coughing, has a sore throat, runny nose, and fever. Patient states she has been sick for 2 days.    UPPER RESPIRATORY TRACT INFECTION Duration: 2 days Worst symptom: Fever: yes Cough: yes Shortness of breath: yes Wheezing: no Chest pain: no Chest tightness: yes Chest congestion: yes Nasal congestion: yes Runny nose: yes Post nasal drip: yes Sneezing: no Sore throat: yes Swollen glands: no Sinus pressure: no Headache: yes Face pain: no Toothache: no Ear pain: no  Ear pressure: no  Eyes red/itching:no Eye drainage/crusting: no  Vomiting: no Rash: no Fatigue: yes Sick contacts: no  Strep contacts: no  Context: worse Recurrent sinusitis: no Relief with OTC cold/cough medications: no  Treatments attempted: cold/sinus and anti-histamine   Relevant past medical, surgical, family and social history reviewed and updated as indicated. Interim medical history since our last visit reviewed. Allergies and medications reviewed and updated.  Review of Systems  Constitutional: Positive for chills, diaphoresis, fatigue and fever. Negative for activity change, appetite change and unexpected weight change.  HENT: Positive for congestion, postnasal drip and rhinorrhea. Negative for dental problem, drooling, ear discharge, ear pain, facial swelling, hearing loss, mouth sores, nosebleeds, sinus pressure, sinus pain, sneezing, sore throat, tinnitus, trouble swallowing and voice change.   Eyes: Negative.   Respiratory: Positive for cough. Negative for apnea, choking, chest tightness, shortness of breath, wheezing and stridor.   Cardiovascular:  Negative.   Gastrointestinal: Negative.   Genitourinary: Negative.   Musculoskeletal: Negative.   Neurological: Negative.   Psychiatric/Behavioral: Negative.     Per HPI unless specifically indicated above     Objective:    BP (!) 144/97   Temp 99.6 F (37.6 C)   Wt 199 lb (90.3 kg)   LMP 05/09/2014 (Approximate)   SpO2 96%   BMI 34.27 kg/m   Wt Readings from Last 3 Encounters:  11/05/20 199 lb (90.3 kg)  10/15/20 202 lb (91.6 kg)  10/01/20 204 lb 9.6 oz (92.8 kg)    Physical Exam Vitals and nursing note reviewed.  Constitutional:      General: She is not in acute distress.    Appearance: Normal appearance. She is not ill-appearing, toxic-appearing or diaphoretic.  HENT:     Head: Normocephalic and atraumatic.     Right Ear: External ear normal.     Left Ear: External ear normal.     Nose: Nose normal.     Mouth/Throat:     Mouth: Mucous membranes are moist.     Pharynx: Oropharynx is clear.  Eyes:     General: No scleral icterus.       Right eye: No discharge.        Left eye: No discharge.     Conjunctiva/sclera: Conjunctivae normal.     Pupils: Pupils are equal, round, and reactive to light.  Pulmonary:     Effort: Pulmonary effort is normal. No respiratory distress.     Comments: Speaking in full sentences Musculoskeletal:        General: Normal range of  motion.     Cervical back: Normal range of motion.  Skin:    Coloration: Skin is not jaundiced or pale.     Findings: No bruising, erythema, lesion or rash.  Neurological:     Mental Status: She is alert and oriented to person, place, and time. Mental status is at baseline.  Psychiatric:        Mood and Affect: Mood normal.        Behavior: Behavior normal.        Thought Content: Thought content normal.        Judgment: Judgment normal.     Results for orders placed or performed in visit on 10/12/20  Novel Coronavirus, NAA ([facility])   Specimen: Saline  Result Value Ref Range   SARS-CoV-2, NAA  Not Detected Not Detected  SARS-COV-2, NAA 2 DAY TAT  Result Value Ref Range   SARS-CoV-2, NAA 2 DAY TAT Performed       Assessment & Plan:   Problem List Items Addressed This Visit   None   Visit Diagnoses    Upper respiratory tract infection, unspecified type    -  Primary   Concern for flu. Will swab for flu and COVID. Await results. Treat as needed. Tussionex and tessalon for comfort.   Relevant Orders   Veritor Flu A/B Waived   Novel Coronavirus, NAA ([facility])       Follow up plan: Return if symptoms worsen or fail to improve.   . This visit was completed via video visit through MyChart due to the restrictions of the COVID-19 pandemic. All issues as above were discussed and addressed. Physical exam was done as above through visual confirmation on video through MyChart. If it was felt that the patient should be evaluated in the office, they were directed there. The patient verbally consented to this visit. . Location of the patient: home . Location of the provider: work . Those involved with this call:  . Provider: Park Liter, DO . CMA: Louanna Raw, [place] . Front Desk/Registration: Jill Side  . Time spent on call: 15 minutes with patient face to face via video conference. More than 50% of this time was spent in counseling and coordination of care. 23 minutes total spent in review of patient's record and preparation of their chart.

## 2020-11-06 LAB — NOVEL CORONAVIRUS, NAA: SARS-CoV-2, NAA: NOT DETECTED

## 2020-11-06 LAB — SARS-COV-2, NAA 2 DAY TAT

## 2020-11-13 ENCOUNTER — Ambulatory Visit: Payer: BC Managed Care – PPO | Admitting: Internal Medicine

## 2020-11-16 ENCOUNTER — Telehealth: Payer: Self-pay

## 2020-11-16 NOTE — Telephone Encounter (Signed)
Lvm to see if pt can see lauren 5/13 instead of dr Neomia Dear

## 2020-11-27 ENCOUNTER — Ambulatory Visit: Payer: BC Managed Care – PPO | Admitting: Nurse Practitioner

## 2020-11-27 ENCOUNTER — Ambulatory Visit: Payer: BC Managed Care – PPO | Admitting: Internal Medicine

## 2020-12-17 ENCOUNTER — Ambulatory Visit: Payer: BC Managed Care – PPO | Admitting: Family Medicine

## 2020-12-21 ENCOUNTER — Ambulatory Visit: Payer: BC Managed Care – PPO | Admitting: Internal Medicine

## 2021-03-14 ENCOUNTER — Encounter: Payer: Self-pay | Admitting: Family Medicine

## 2021-03-16 ENCOUNTER — Telehealth (INDEPENDENT_AMBULATORY_CARE_PROVIDER_SITE_OTHER): Payer: Self-pay | Admitting: Family Medicine

## 2021-03-16 ENCOUNTER — Encounter: Payer: Self-pay | Admitting: Family Medicine

## 2021-03-16 ENCOUNTER — Other Ambulatory Visit: Payer: Self-pay

## 2021-03-16 VITALS — BP 126/80 | Temp 99.9°F

## 2021-03-16 DIAGNOSIS — U071 COVID-19: Secondary | ICD-10-CM

## 2021-03-16 MED ORDER — BENZONATATE 200 MG PO CAPS: 200.0000 mg | ORAL_CAPSULE | Freq: Two times a day (BID) | ORAL | 0 refills | Status: DC | PRN

## 2021-03-16 MED ORDER — MOLNUPIRAVIR EUA 200MG CAPSULE
4.0000 | ORAL_CAPSULE | Freq: Two times a day (BID) | ORAL | 0 refills | Status: AC
  Filled 2021-03-16: qty 40, 5d supply, fill #0

## 2021-03-16 MED ORDER — HYDROCOD POLST-CPM POLST ER 10-8 MG/5ML PO SUER: 5.0000 mL | Freq: Two times a day (BID) | ORAL | 0 refills | Status: DC | PRN

## 2021-03-16 MED ORDER — PREDNISONE 50 MG PO TABS: 50.0000 mg | ORAL_TABLET | Freq: Every day | ORAL | 0 refills | Status: DC

## 2021-03-16 NOTE — Progress Notes (Signed)
BP 126/80   Temp 99.9 F (37.7 C) (Oral)   LMP 05/09/2014 (Approximate)    Subjective:    Patient ID: Kendra Espinoza, female    DOB: [birth year], 60 y.o.   MRN: [id number]  HPI: Kendra Espinoza is a 60 y.o. female  Chief Complaint  Patient presents with   Covid Positive    Started feeling bad last Saturday evening, took a at home covid test which came back positive. Symptoms are: cough,runny nose, sore throat, headache,diarrhea and a slight fever,    UPPER RESPIRATORY TRACT INFECTION Duration: 4 days Worst symptom: cough,  Fever: yes Cough: yes Shortness of breath: yes Wheezing: no Chest pain: yes, with cough Chest tightness: yes Chest congestion: yes Nasal congestion: yes Runny nose: no Post nasal drip: yes Sneezing: no Sore throat: yes Swollen glands: no Sinus pressure: yes Headache: yes Face pain: no Toothache: yes Ear pain: no  Ear pressure: no  Eyes red/itching:no Eye drainage/crusting: no  Vomiting: no Rash: no Fatigue: yes Sick contacts: yes Strep contacts: no  Context: worse Recurrent sinusitis: no Relief with OTC cold/cough medications: no  Treatments attempted: cold/sinus   Relevant past medical, surgical, family and social history reviewed and updated as indicated. Interim medical history since our last visit reviewed. Allergies and medications reviewed and updated.  Review of Systems  Constitutional:  Positive for chills, diaphoresis, fatigue and fever. Negative for activity change, appetite change and unexpected weight change.  HENT:  Positive for congestion, postnasal drip, sinus pressure and sore throat. Negative for dental problem, drooling, ear discharge, ear pain, facial swelling, hearing loss, mouth sores, nosebleeds, rhinorrhea, sinus pain, sneezing, tinnitus, trouble swallowing and voice change.   Eyes: Negative.   Respiratory:  Positive for cough and shortness of breath. Negative for apnea, choking, chest tightness, wheezing and stridor.    Cardiovascular: Negative.   Skin: Negative.   Psychiatric/Behavioral: Negative.     Per HPI unless specifically indicated above     Objective:    BP 126/80   Temp 99.9 F (37.7 C) (Oral)   LMP 05/09/2014 (Approximate)   Wt Readings from Last 3 Encounters:  11/05/20 199 lb (90.3 kg)  10/15/20 202 lb (91.6 kg)  10/01/20 204 lb 9.6 oz (92.8 kg)    Physical Exam Vitals and nursing note reviewed.  Constitutional:      General: She is not in acute distress.    Appearance: Normal appearance. She is not ill-appearing, toxic-appearing or diaphoretic.  HENT:     Head: Normocephalic and atraumatic.     Right Ear: External ear normal.     Left Ear: External ear normal.     Nose: Nose normal.     Mouth/Throat:     Mouth: Mucous membranes are moist.     Pharynx: Oropharynx is clear.  Eyes:     General: No scleral icterus.       Right eye: No discharge.        Left eye: No discharge.     Conjunctiva/sclera: Conjunctivae normal.     Pupils: Pupils are equal, round, and reactive to light.  Pulmonary:     Effort: Pulmonary effort is normal. No respiratory distress.     Comments: Speaking in full sentences Musculoskeletal:        General: Normal range of motion.     Cervical back: Normal range of motion.  Skin:    Coloration: Skin is not jaundiced or pale.     Findings: No bruising, erythema, lesion or rash.  Neurological:     Mental Status: She is alert and oriented to person, place, and time. Mental status is at baseline.  Psychiatric:        Mood and Affect: Mood normal.        Behavior: Behavior normal.        Thought Content: Thought content normal.        Judgment: Judgment normal.    Results for orders placed or performed in visit on 11/05/20  Novel Coronavirus, NAA ([facility])   Specimen: Saline  Result Value Ref Range   SARS-CoV-2, NAA Not Detected Not Detected  SARS-COV-2, NAA 2 DAY TAT  Result Value Ref Range   SARS-CoV-2, NAA 2 DAY TAT Performed   Veritor Flu  A/B Waived  Result Value Ref Range   Influenza A Negative Negative   Influenza B Negative Negative      Assessment & Plan:   Problem List Items Addressed This Visit   None Visit Diagnoses     COVID-19    -  Primary   Will treat with mulnopirovir, tessalon, tussionex and prednisone. Call if not getting better or getting worse. Continue to monitor.    Relevant Medications   molnupiravir EUA 200 mg CAPS        Follow up plan: Return if symptoms worsen or fail to improve.    This visit was completed via video visit through MyChart due to the restrictions of the COVID-19 pandemic. All issues as above were discussed and addressed. Physical exam was done as above through visual confirmation on video through MyChart. If it was felt that the patient should be evaluated in the office, they were directed there. The patient verbally consented to this visit. Location of the patient: home Location of the provider: work Those involved with this call:  Provider: Park Liter, DO CMA: Frazier Butt, CMA Front Desk/Registration: Barth Kir[place]  Time spent on call:  15 minutes with patient face to face via video conference. More than 50% of this time was spent in counseling and coordination of care. 23 minutes total spent in review of patient's record and preparation of their chart.

## 2021-03-17 ENCOUNTER — Telehealth: Payer: Self-pay | Admitting: Internal Medicine

## 2021-03-17 ENCOUNTER — Other Ambulatory Visit: Payer: Self-pay

## 2021-03-17 NOTE — Telephone Encounter (Signed)
Routing to prescribing provider to advise.

## 2021-03-17 NOTE — Telephone Encounter (Signed)
Per the federal guidelines, she can have 5 days of medication, which would be 61m. I don't know what to tell them

## 2021-03-17 NOTE — Telephone Encounter (Signed)
Amid pharm with [facility] is calling the tussinex does not come in 50 ml but 70 ml. Please clarify and call pharm back

## 2021-03-17 NOTE — Telephone Encounter (Signed)
Called and spoke with the pharmacist, he states that the cough syrup and tramadol can have a 7 day supply, they are not able to break the bottle.

## 2021-03-18 NOTE — Telephone Encounter (Signed)
That's only after surgery. OK to dispense 62m

## 2021-03-18 NOTE — Telephone Encounter (Signed)
They need a new prescription.

## 2021-03-19 MED ORDER — HYDROCOD POLST-CPM POLST ER 10-8 MG/5ML PO SUER: 5.0000 mL | Freq: Two times a day (BID) | ORAL | 0 refills | Status: DC | PRN

## 2021-03-26 NOTE — Telephone Encounter (Signed)
Needs appt with PCP early next week

## 2021-03-30 ENCOUNTER — Telehealth: Payer: Self-pay | Admitting: Internal Medicine

## 2021-03-30 NOTE — Telephone Encounter (Signed)
Called pt to schedule appt. LMOM asking her to give [place] a called back to get scheduled today or tomorrow.

## 2021-03-31 ENCOUNTER — Ambulatory Visit: Payer: Self-pay | Admitting: Internal Medicine

## 2021-04-06 ENCOUNTER — Ambulatory Visit (INDEPENDENT_AMBULATORY_CARE_PROVIDER_SITE_OTHER): Payer: BC Managed Care – PPO | Admitting: Internal Medicine

## 2021-04-06 ENCOUNTER — Encounter: Payer: Self-pay | Admitting: Internal Medicine

## 2021-04-06 ENCOUNTER — Other Ambulatory Visit: Payer: Self-pay

## 2021-04-06 VITALS — BP 148/93 | HR 72 | Temp 98.3°F | Ht 64.0 in | Wt 206.0 lb

## 2021-04-06 DIAGNOSIS — I10 Essential (primary) hypertension: Secondary | ICD-10-CM

## 2021-04-06 DIAGNOSIS — R8271 Bacteriuria: Secondary | ICD-10-CM | POA: Diagnosis not present

## 2021-04-06 DIAGNOSIS — W19XXXA Unspecified fall, initial encounter: Secondary | ICD-10-CM

## 2021-04-06 DIAGNOSIS — R42 Dizziness and giddiness: Secondary | ICD-10-CM | POA: Diagnosis not present

## 2021-04-06 LAB — URINALYSIS, ROUTINE W REFLEX MICROSCOPIC
Bilirubin, UA: NEGATIVE
Glucose, UA: NEGATIVE
Ketones, UA: NEGATIVE
Nitrite, UA: NEGATIVE
Protein,UA: NEGATIVE
RBC, UA: NEGATIVE
Specific Gravity, UA: 1.025 (ref 1.005–1.030)
Urobilinogen, Ur: 0.2 mg/dL (ref 0.2–1.0)
pH, UA: 5.5 (ref 5.0–7.5)

## 2021-04-06 LAB — MICROSCOPIC EXAMINATION
Bacteria, UA: NONE SEEN
RBC, Urine: NONE SEEN /hpf (ref 0–2)

## 2021-04-06 LAB — BAYER DCA HB A1C WAIVED: HB A1C (BAYER DCA - WAIVED): 6.5 % — ABNORMAL HIGH (ref 4.8–5.6)

## 2021-04-06 MED ORDER — LOSARTAN POTASSIUM 50 MG PO TABS: 25.0000 mg | ORAL_TABLET | Freq: Every day | ORAL | 5 refills | Status: DC

## 2021-04-06 MED ORDER — FEXOFENADINE HCL 180 MG PO TABS: 180.0000 mg | ORAL_TABLET | Freq: Every day | ORAL | 1 refills | Status: DC

## 2021-04-06 NOTE — Progress Notes (Signed)
BP (!) 148/93 (BP Location: Left Arm, Cuff Size: Normal)   Pulse 72   Temp 98.3 F (36.8 C) (Oral)   Ht 5\' 4"  (1.626 m)   Wt 206 lb (93.4 kg)   LMP 05/09/2014 (Approximate)   SpO2 98%   BMI 35.36 kg/m    Subjective:    Patient ID: Kendra Espinoza, female    DOB: 1961/01/03, 60 y.o.   MRN: [id number]  Chief Complaint  Patient presents with   Post Covid    Pt states she is not feeling well. States she feels like she is in a fog.    Fall    Pt states she had a fall a week and a half ago. Hit her L shoulder and arm.     HPI: Kendra Espinoza is a 60 y.o. female  Works for [employer] and now at [employer]. Wants paperwork filled 28th of august 14th of sept 2022.. was having trouble sleeping during the day years ago by last pcp. Was taking 1/2 a pill during the day.   Fall The accident occurred More than 1 week ago (on the 9th of sept, felt lightehaded 1 pm in the afternoon had taken seroquel befroe for help sleeping. got up 20 mins later as she was home alone.). Associated symptoms include headaches.  Hypertension This is a chronic problem. The current episode started more than 1 year ago. The problem has been waxing and waning since onset. The problem is uncontrolled. Associated symptoms include blurred vision, headaches and shortness of breath. Pertinent negatives include no anxiety, chest pain, malaise/fatigue, neck pain, orthopnea, palpitations, peripheral edema, PND or sweats. There is no history of a hypertension causing med.   Chief Complaint  Patient presents with   Post Covid    Pt states she is not feeling well. States she feels like she is in a fog.    Fall    Pt states she had a fall a week and a half ago. Hit her L shoulder and arm.     Relevant past medical, surgical, family and social history reviewed and updated as indicated. Interim medical history since our last visit reviewed. Allergies and medications reviewed and updated.  Review of Systems  Constitutional:  Negative  for malaise/fatigue.  Eyes:  Positive for blurred vision.  Respiratory:  Positive for shortness of breath.   Cardiovascular:  Negative for chest pain, palpitations, orthopnea and PND.  Musculoskeletal:  Negative for neck pain.  Neurological:  Positive for headaches.   Per HPI unless specifically indicated above     Objective:    BP (!) 148/93 (BP Location: Left Arm, Cuff Size: Normal)   Pulse 72   Temp 98.3 F (36.8 C) (Oral)   Ht 5\' 4"  (1.626 m)   Wt 206 lb (93.4 kg)   LMP 05/09/2014 (Approximate)   SpO2 98%   BMI 35.36 kg/m   Wt Readings from Last 3 Encounters:  04/06/21 206 lb (93.4 kg)  11/05/20 199 lb (90.3 kg)  10/15/20 202 lb (91.6 kg)    Physical Exam  Unable to peform sec to virtual visit.   Results for orders placed or performed in visit on 11/05/20  Novel Coronavirus, NAA ([facility])   Specimen: Saline  Result Value Ref Range   SARS-CoV-2, NAA Not Detected Not Detected  SARS-COV-2, NAA 2 DAY TAT  Result Value Ref Range   SARS-CoV-2, NAA 2 DAY TAT Performed   Veritor Flu A/B Waived  Result Value Ref Range   Influenza  A Negative Negative   Influenza B Negative Negative        Current Outpatient Medications:    atorvastatin (LIPITOR) 10 MG tablet, Take 1 tablet (10 mg total) by mouth daily., Disp: 90 tablet, Rfl: 4   QUEtiapine (SEROQUEL) 50 MG tablet, Take 1.5 tablets (75 mg total) by mouth daily. If no improvement with 1 1/2 pills may try taking 2 pills at bedtime (100 MG) by mouth., Disp: 180 tablet, Rfl: 4   topiramate (TOPAMAX) 25 MG tablet, Take 1 tablet (25 mg total) by mouth 2 (two) times daily., Disp: 180 tablet, Rfl: 1   amitriptyline (ELAVIL) 25 MG tablet, Take 25 mg by mouth at bedtime. (Patient not taking: Reported on 04/06/2021), Disp: , Rfl:    famotidine (PEPCID) 20 MG tablet, Take 20 mg by mouth 2 (two) times daily. (Patient not taking: Reported on 04/06/2021), Disp: , Rfl:    losartan (COZAAR) 25 MG tablet, Take 1 tablet (25 mg total) by  mouth daily. (Patient not taking: Reported on 04/06/2021), Disp: 90 tablet, Rfl: 4   valACYclovir (VALTREX) 1000 MG tablet, Take 1 tablet (1,000 mg total) by mouth 2 (two) times daily. (Patient not taking: Reported on 04/06/2021), Disp: 20 tablet, Rfl: 0    Assessment & Plan:  HTN: Increase losartan to 50 mg daily. Consider adding another agent if bp still high. Medication compliance emphasised. pt advised to keep Bp logs. Pt verbalised understanding of the same. Pt to have a low salt diet . Exercise to reach a goal of at least 150 mins a week.  lifestyle modifications explained and pt understands importance of the above.  2. Falls :  ? Sec to seroquel  Stop this for now.   3. Insomnia : works 2nd shift now.  Will need to start with melatonin .  4. Allergic rhinitis post COVID congestion:  Will start pt on allegra for such.take at night to help with sleep as well.  Problem List Items Addressed This Visit   None    No orders of the defined types were placed in this encounter.    Meds ordered this encounter  Medications   losartan (COZAAR) 50 MG tablet    Sig: Take 0.5 tablets (25 mg total) by mouth daily.    Dispense:  30 tablet    Refill:  5   fexofenadine (ALLEGRA ALLERGY) 180 MG tablet    Sig: Take 1 tablet (180 mg total) by mouth daily.    Dispense:  10 tablet    Refill:  1     Follow up plan: No follow-ups on file.

## 2021-04-07 LAB — CBC
Hematocrit: 42.2 % (ref 34.0–46.6)
Hemoglobin: 14.1 g/dL (ref 11.1–15.9)
MCH: 29.7 pg (ref 26.6–33.0)
MCHC: 33.4 g/dL (ref 31.5–35.7)
MCV: 89 fL (ref 79–97)
Platelets: 214 10*3/uL (ref 150–450)
RBC: 4.74 x10E6/uL (ref 3.77–5.28)
RDW: 11.8 % (ref 11.7–15.4)
WBC: 6 10*3/uL (ref 3.4–10.8)

## 2021-04-07 LAB — BASIC METABOLIC PANEL
BUN/Creatinine Ratio: 10 — ABNORMAL LOW (ref 12–28)
BUN: 8 mg/dL (ref 8–27)
CO2: 22 mmol/L (ref 20–29)
Calcium: 9.3 mg/dL (ref 8.7–10.3)
Chloride: 104 mmol/L (ref 96–106)
Creatinine, Ser: 0.82 mg/dL (ref 0.57–1.00)
Glucose: 145 mg/dL — ABNORMAL HIGH (ref 65–99)
Potassium: 4 mmol/L (ref 3.5–5.2)
Sodium: 140 mmol/L (ref 134–144)
eGFR: 81 mL/min/{1.73_m2} (ref >59)

## 2021-04-11 LAB — URINE CULTURE

## 2021-04-20 ENCOUNTER — Encounter: Payer: Self-pay | Admitting: Internal Medicine

## 2021-04-20 ENCOUNTER — Other Ambulatory Visit: Payer: Self-pay

## 2021-04-20 ENCOUNTER — Ambulatory Visit: Payer: BC Managed Care – PPO | Admitting: Internal Medicine

## 2021-04-20 VITALS — BP 112/80 | HR 65 | Ht 64.0 in | Wt 206.0 lb

## 2021-04-20 DIAGNOSIS — E119 Type 2 diabetes mellitus without complications: Secondary | ICD-10-CM

## 2021-04-20 DIAGNOSIS — E785 Hyperlipidemia, unspecified: Secondary | ICD-10-CM

## 2021-04-20 DIAGNOSIS — I10 Essential (primary) hypertension: Secondary | ICD-10-CM | POA: Diagnosis not present

## 2021-04-20 DIAGNOSIS — R519 Headache, unspecified: Secondary | ICD-10-CM | POA: Diagnosis not present

## 2021-04-20 DIAGNOSIS — R42 Dizziness and giddiness: Secondary | ICD-10-CM | POA: Diagnosis not present

## 2021-04-20 MED ORDER — SUMATRIPTAN SUCCINATE 25 MG PO TABS: 25.0000 mg | ORAL_TABLET | ORAL | 2 refills | Status: DC | PRN

## 2021-04-20 MED ORDER — METFORMIN HCL 500 MG PO TABS: 500.0000 mg | ORAL_TABLET | Freq: Every day | ORAL | 3 refills | Status: DC

## 2021-04-20 MED ORDER — LOSARTAN POTASSIUM 50 MG PO TABS: 50.0000 mg | ORAL_TABLET | Freq: Every day | ORAL | 5 refills | Status: DC

## 2021-04-20 MED ORDER — ATORVASTATIN CALCIUM 40 MG PO TABS: 40.0000 mg | ORAL_TABLET | Freq: Every day | ORAL | 3 refills | Status: DC

## 2021-04-20 NOTE — Patient Instructions (Signed)
Results for Kendra Espinoza, Kendra Espinoza (MRN [id number]) as of 04/20/2021 10:37  Ref. Range 10/01/2020 09:56  Cholesterol, Total Latest Ref Range: 100 - 199 mg/dL 294 (H)  HDL Cholesterol Latest Ref Range: >39 mg/dL 47  Triglycerides Latest Ref Range: 0 - 149 mg/dL 285Diabetes Mellitus and Nutrition, Adult When you have diabetes, or diabetes mellitus, it is very important to have healthy eating habits because your blood sugar (glucose) levels are greatly affected by what you eat and drink. Eating healthy foods in the right amounts, at about the same times every day, can help you: Control your blood glucose. Lower your risk of heart disease. Improve your blood pressure. Reach or maintain a healthy weight. What can affect my meal plan? Every person with diabetes is different, and each person has different needs for a meal plan. Your health care provider may recommend that you work with a dietitian to make a meal plan that is best for you. Your meal plan may vary depending on factors such as: The calories you need. The medicines you take. Your weight. Your blood glucose, blood pressure, and cholesterol levels. Your activity level. Other health conditions you have, such as heart or kidney disease. How do carbohydrates affect me? Carbohydrates, also called carbs, affect your blood glucose level more than any other type of food. Eating carbs naturally raises the amount of glucose in your blood. Carb counting is a method for keeping track of how many carbs you eat. Counting carbs is important to keep your blood glucose at a healthy level, especially if you use insulin or take certain oral diabetes medicines. It is important to know how many carbs you can safely have in each meal. This is different for every person. Your dietitian can help you calculate how many carbs you should have at each meal and for each snack. How does alcohol affect me? Alcohol can cause a sudden decrease in blood glucose (hypoglycemia),  especially if you use insulin or take certain oral diabetes medicines. Hypoglycemia can be a life-threatening condition. Symptoms of hypoglycemia, such as sleepiness, dizziness, and confusion, are similar to symptoms of having too much alcohol. Do not drink alcohol if: Your health care provider tells you not to drink. You are pregnant, may be pregnant, or are planning to become pregnant. If you drink alcohol: Do not drink on an empty stomach. Limit how much you use to: 0-1 drink a day for women. 0-2 drinks a day for men. Be aware of how much alcohol is in your drink. In the [place] one drink equals one 12 oz bottle of beer (355 mL), one 5 oz glass of wine (148 mL), or one 1 oz glass of hard liquor (44 mL). Keep yourself hydrated with water, diet soda, or unsweetened iced tea. Keep in mind that regular soda, juice, and other mixers may contain a lot of sugar and must be counted as carbs. What are tips for following this plan? Reading food labels Start by checking the serving size on the "Nutrition Facts" label of packaged foods and drinks. The amount of calories, carbs, fats, and other nutrients listed on the label is based on one serving of the item. Many items contain more than one serving per package. Check the total grams (g) of carbs in one serving. You can calculate the number of servings of carbs in one serving by dividing the total carbs by 15. For example, if a food has 30 g of total carbs per serving, it would be equal to 2  servings of carbs. Check the number of grams (g) of saturated fats and trans fats in one serving. Choose foods that have a low amount or none of these fats. Check the number of milligrams (mg) of salt (sodium) in one serving. Most people should limit total sodium intake to less than 2,300 mg per day. Always check the nutrition information of foods labeled as "low-fat" or "nonfat." These foods may be higher in added sugar or refined carbs and should be avoided. Talk to  your dietitian to identify your daily goals for nutrients listed on the label. Shopping Avoid buying canned, pre-made, or processed foods. These foods tend to be high in fat, sodium, and added sugar. Shop around the outside edge of the grocery store. This is where you will most often find fresh fruits and vegetables, bulk grains, fresh meats, and fresh dairy. Cooking Use low-heat cooking methods, such as baking, instead of high-heat cooking methods like deep frying. Cook using healthy oils, such as olive, canola, or sunflower oil. Avoid cooking with butter, cream, or high-fat meats. Meal planning Eat meals and snacks regularly, preferably at the same times every day. Avoid going long periods of time without eating. Eat foods that are high in fiber, such as fresh fruits, vegetables, beans, and whole grains. Talk with your dietitian about how many servings of carbs you can eat at each meal. Eat 4-6 oz (112-168 g) of lean protein each day, such as lean meat, chicken, fish, eggs, or tofu. One ounce (oz) of lean protein is equal to: 1 oz (28 g) of meat, chicken, or fish. 1 egg.  cup (62 g) of tofu. Eat some foods each day that contain healthy fats, such as avocado, nuts, seeds, and fish. What foods should I eat? Fruits Berries. Apples. Oranges. Peaches. Apricots. Plums. Grapes. Mango. Papaya. Pomegranate. Kiwi. Cherries. Vegetables Lettuce. Spinach. Leafy greens, including kale, chard, collard greens, and mustard greens. Beets. Cauliflower. Cabbage. Broccoli. Carrots. Green beans. Tomatoes. Peppers. Onions. Cucumbers. Brussels sprouts. Grains Whole grains, such as whole-wheat or whole-grain bread, crackers, tortillas, cereal, and pasta. Unsweetened oatmeal. Quinoa. Brown or wild rice. Meats and other proteins Seafood. Poultry without skin. Lean cuts of poultry and beef. Tofu. Nuts. Seeds. Dairy Low-fat or fat-free dairy products such as milk, yogurt, and cheese. The items listed above may not  be a complete list of foods and beverages you can eat. Contact a dietitian for more information. What foods should I avoid? Fruits Fruits canned with syrup. Vegetables Canned vegetables. Frozen vegetables with butter or cream sauce. Grains Refined white flour and flour products such as bread, pasta, snack foods, and cereals. Avoid all processed foods. Meats and other proteins Fatty cuts of meat. Poultry with skin. Breaded or fried meats. Processed meat. Avoid saturated fats. Dairy Full-fat yogurt, cheese, or milk. Beverages Sweetened drinks, such as soda or iced tea. The items listed above may not be a complete list of foods and beverages you should avoid. Contact a dietitian for more information. Questions to ask a health care provider Do I need to meet with a diabetes educator? Do I need to meet with a dietitian? What number can I call if I have questions? When are the best times to check my blood glucose? Where to find more information: American Diabetes Association: diabetes.org Academy of Nutrition and Dietetics: www.eatright.[employer] of Diabetes and Digestive and Kidney Diseases: [employer] Association of Diabetes Care and Education Specialists: www.diabeteseducator.org Summary It is important to have healthy eating habits because your blood  sugar (glucose) levels are greatly affected by what you eat and drink. A healthy meal plan will help you control your blood glucose and maintain a healthy lifestyle. Your health care provider may recommend that you work with a dietitian to make a meal plan that is best for you. Keep in mind that carbohydrates (carbs) and alcohol have immediate effects on your blood glucose levels. It is important to count carbs and to use alcohol carefully. This information is not intended to replace advice given to you by your health care provider. Make sure you discuss any questions you have with your health care provider. Document  Revised: 06/11/2019 Document Reviewed: 06/11/2019 Elsevier Patient Education  2021 [place]. Diabetes Mellitus and Exercise Exercising regularly is important for overall health, especially for people who have diabetes mellitus. Exercising is not only about losing weight. It has many other health benefits, such as increasing muscle strength and bone density and reducing body fat and stress. This leads to improved fitness, flexibility, and endurance, all of which result in better overall health. What are the benefits of exercise if I have diabetes? Exercise has many benefits for people with diabetes. They include: Helping to lower and control blood sugar (glucose). Helping the body to respond better to the hormone insulin by improving insulin sensitivity. Reducing how much insulin the body needs. Lowering the risk for heart disease by: Lowering "bad" cholesterol and triglyceride levels. Increasing "good" cholesterol levels. Lowering blood pressure. Lowering blood glucose levels. What is my activity plan? Your health care provider or certified diabetes educator can help you make a plan for the type and frequency of exercise that works for you. This is called your activity plan. Be sure to: Get at least 150 minutes of medium-intensity or high-intensity exercise each week. Exercises may include brisk walking, biking, or water aerobics. Do stretching and strengthening exercises, such as yoga or weight lifting, at least 2 times a week. Spread out your activity over at least 3 days of the week. Get some form of physical activity each day. Do not go more than 2 days in a row without some kind of physical activity. Avoid being inactive for more than 90 minutes at a time. Take frequent breaks to walk or stretch. Choose exercises or activities that you enjoy. Set realistic goals. Start slowly and gradually increase your exercise intensity over time. How do I manage my diabetes during  exercise? Monitor your blood glucose Check your blood glucose before and after exercising. If your blood glucose is: 240 mg/dL (13.3 mmol/L) or higher before you exercise, check your urine for ketones. These are chemicals created by the liver. If you have ketones in your urine, do not exercise until your blood glucose returns to normal. 100 mg/dL (5.6 mmol/L) or lower, eat a snack containing 15-20 grams of carbohydrate. Check your blood glucose 15 minutes after the snack to make sure that your glucose level is above 100 mg/dL (5.6 mmol/L) before you start your exercise. Know the symptoms of low blood glucose (hypoglycemia) and how to treat it. Your risk for hypoglycemia increases during and after exercise. Follow these tips and your health care provider's instructions Keep a carbohydrate snack that is fast-acting for use before, during, and after exercise to help prevent or treat hypoglycemia. Avoid injecting insulin into areas of the body that are going to be exercised. For example, avoid injecting insulin into: Your arms, when you are about to play tennis. Your legs, when you are about to go jogging. Keep  records of your exercise habits. Doing this can help you and your health care provider adjust your diabetes management plan as needed. Write down: Food that you eat before and after you exercise. Blood glucose levels before and after you exercise. The type and amount of exercise you have done. Work with your health care provider when you start a new exercise or activity. He or she may need to: Make sure that the activity is safe for you. Adjust your insulin, other medicines, and food that you eat. Drink plenty of water while you exercise. This prevents loss of water (dehydration) and problems caused by a lot of heat in the body (heat stroke). Where to find more information American Diabetes Association: www.diabetes.org Summary Exercising regularly is important for overall health, especially  for people who have diabetes mellitus. Exercising has many health benefits. It increases muscle strength and bone density and reduces body fat and stress. It also lowers and controls blood glucose. Your health care provider or certified diabetes educator can help you make an activity plan for the type and frequency of exercise that works for you. Work with your health care provider to make sure any new activity is safe for you. Also work with your health care provider to adjust your insulin, other medicines, and the food you eat. This information is not intended to replace advice given to you by your health care provider. Make sure you discuss any questions you have with your health care provider. Document Revised: 04/01/2019 Document Reviewed: 04/01/2019 Elsevier Patient Education  [place].  (H)  VLDL Cholesterol Cal Latest Ref Range: 5 - 40 mg/dL 56 (H)  LDL Chol Calc (NIH) Latest Ref Range: 0 - 99 mg/dL 191 (H)

## 2021-04-20 NOTE — Progress Notes (Signed)
BP 112/80   Pulse 65   Ht '5\' 4"'  (1.626 m)   Wt 206 lb (93.4 kg)   LMP 05/09/2014 (Approximate)   SpO2 98%   BMI 35.36 kg/m    Subjective:    Patient ID: Kendra Espinoza, female    DOB: [birth year], 60 y.o.   MRN: [id number]  Chief Complaint  Patient presents with   Hypertension    HPI: Kendra Espinoza is a 60 y.o. female  Pt is here for a fu from last week.   Hypertension This is a chronic problem. The current episode started more than 1 year ago. Associated symptoms include headaches. Pertinent negatives include no anxiety.  Headache  This is a recurrent (end of aug - sept 1st week had COVID has had recurrent headaches since thne) problem. The current episode started 1 to 4 weeks ago. Her past medical history is significant for hypertension.  Diabetes She presents for her initial diabetic visit. She has type 2 diabetes mellitus. Hypoglycemia symptoms include headaches.   Chief Complaint  Patient presents with   Hypertension    Relevant past medical, surgical, family and social history reviewed and updated as indicated. Interim medical history since our last visit reviewed. Allergies and medications reviewed and updated.  Review of Systems  Neurological:  Positive for headaches.   Per HPI unless specifically indicated above     Objective:    BP 112/80   Pulse 65   Ht '5\' 4"'  (1.626 m)   Wt 206 lb (93.4 kg)   LMP 05/09/2014 (Approximate)   SpO2 98%   BMI 35.36 kg/m   Wt Readings from Last 3 Encounters:  04/20/21 206 lb (93.4 kg)  04/06/21 206 lb (93.4 kg)  11/05/20 199 lb (90.3 kg)    Physical Exam Vitals and nursing note reviewed.  Constitutional:      General: She is not in acute distress.    Appearance: Normal appearance. She is not ill-appearing or diaphoretic.  Eyes:     Conjunctiva/sclera: Conjunctivae normal.  Pulmonary:     Breath sounds: No rhonchi.  Abdominal:     General: Abdomen is flat. Bowel sounds are normal. There is no distension.      Palpations: Abdomen is soft. There is no mass.     Tenderness: There is no abdominal tenderness. There is no guarding.  Skin:    General: Skin is warm and dry.     Coloration: Skin is not jaundiced.     Findings: No erythema.  Neurological:     General: No focal deficit present.     Mental Status: She is alert and oriented to person, place, and time. Mental status is at baseline.     Cranial Nerves: No cranial nerve deficit.  Psychiatric:        Mood and Affect: Mood normal.        Thought Content: Thought content normal.    Results for orders placed or performed in visit on 04/06/21  Microscopic Examination   Urine  Result Value Ref Range   WBC, UA 0-5 0 - 5 /hpf   RBC None seen 0 - 2 /hpf   Epithelial Cells (non renal) 0-10 0 - 10 /hpf   Bacteria, UA None seen None seen/Few  Urine Culture   Specimen: Urine   UR  Result Value Ref Range   Urine Culture, Routine Final report    Organism ID, Bacteria Comment   Bayer DCA Hb A1c Waived  Result Value Ref Range  HB A1C (BAYER DCA - WAIVED) 6.5 (H) 4.8 - 5.6 %  Basic metabolic panel  Result Value Ref Range   Glucose 145 (H) 65 - 99 mg/dL   BUN 8 8 - 27 mg/dL   Creatinine, Ser 0.82 0.57 - 1.00 mg/dL   eGFR 81 >59 mL/min/1.73   BUN/Creatinine Ratio 10 (L) 12 - 28   Sodium 140 134 - 144 mmol/L   Potassium 4.0 3.5 - 5.2 mmol/L   Chloride 104 96 - 106 mmol/L   CO2 22 20 - 29 mmol/L   Calcium 9.3 8.7 - 10.3 mg/dL  Urinalysis, Routine w reflex microscopic  Result Value Ref Range   Specific Gravity, UA 1.025 1.005 - 1.030   pH, UA 5.5 5.0 - 7.5   Color, UA Yellow Yellow   Appearance Ur Clear Clear   Leukocytes,UA Trace (A) Negative   Protein,UA Negative Negative/Trace   Glucose, UA Negative Negative   Ketones, UA Negative Negative   RBC, UA Negative Negative   Bilirubin, UA Negative Negative   Urobilinogen, Ur 0.2 0.2 - 1.0 mg/dL   Nitrite, UA Negative Negative   Microscopic Examination See below:   CBC (STAT)  Result  Value Ref Range   WBC 6.0 3.4 - 10.8 x10E3/uL   RBC 4.74 3.77 - 5.28 x10E6/uL   Hemoglobin 14.1 11.1 - 15.9 g/dL   Hematocrit 42.2 34.0 - 46.6 %   MCV 89 79 - 97 fL   MCH 29.7 26.6 - 33.0 pg   MCHC 33.4 31.5 - 35.7 g/dL   RDW 11.8 11.7 - 15.4 %   Platelets 214 150 - 450 x10E3/uL        Current Outpatient Medications:    amitriptyline (ELAVIL) 25 MG tablet, Take 25 mg by mouth at bedtime., Disp: , Rfl:    famotidine (PEPCID) 20 MG tablet, Take 20 mg by mouth 2 (two) times daily., Disp: , Rfl:    fexofenadine (ALLEGRA ALLERGY) 180 MG tablet, Take 1 tablet (180 mg total) by mouth daily., Disp: 10 tablet, Rfl: 1   metFORMIN (GLUCOPHAGE) 500 MG tablet, Take 1 tablet (500 mg total) by mouth daily., Disp: 30 tablet, Rfl: 3   SUMAtriptan (IMITREX) 25 MG tablet, Take 1 tablet (25 mg total) by mouth every 2 (two) hours as needed for migraine. May repeat in 2 hours if headache persists or recurs. Not to exceeed > 100 mg in 24 hour period, Disp: 20 tablet, Rfl: 2   topiramate (TOPAMAX) 25 MG tablet, Take 1 tablet (25 mg total) by mouth 2 (two) times daily., Disp: 180 tablet, Rfl: 1   valACYclovir (VALTREX) 1000 MG tablet, Take 1 tablet (1,000 mg total) by mouth 2 (two) times daily., Disp: 20 tablet, Rfl: 0   atorvastatin (LIPITOR) 40 MG tablet, Take 1 tablet (40 mg total) by mouth daily., Disp: 90 tablet, Rfl: 3   losartan (COZAAR) 50 MG tablet, Take 1 tablet (50 mg total) by mouth daily., Disp: 90 tablet, Rfl: 5    Assessment & Plan:  Hld : restart lipitor 40 mg daily HLD recheck FLP, check LFT's work on diet, SE of meds explained to pt. low fat and high fiber diet explained to pt.  Ref. Range 10/01/2020 09:56  Cholesterol, Total Latest Ref Range: 100 - 199 mg/dL 294 (H)  HDL Cholesterol Latest Ref Range: >39 mg/dL 47  Triglycerides Latest Ref Range: 0 - 149 mg/dL 285 (H)  VLDL Cholesterol Cal Latest Ref Range: 5 - 40 mg/dL 56 (H)  LDL Chol  Calc (NIH) Latest Ref Range: 0 - 99 mg/dL 191 (H)    2. DM  Start pt on metformin 500 mg.  check HbA1c,  urine  microalbumin  diabetic diet plan given to pt  adviced regarding hypoglycemia and instructions given to pt today on how to prevent and treat the same if it were to occur. pt acknowledges the plan and voices understanding of the same.  exercise plan given and encouraged.   advice diabetic yearly podiatry, ophthalmology , nutritionist , dental check q 6 months,    Problem List Items Addressed This Visit       Cardiovascular and Mediastinum   Primary hypertension   Relevant Medications   losartan (COZAAR) 50 MG tablet   atorvastatin (LIPITOR) 40 MG tablet   Other Relevant Orders   Bayer DCA Hb A1c Waived   Lipid panel   Vitamin B12   Urinalysis, Routine w reflex microscopic   Comprehensive metabolic panel   Microalbumin, Urine Waived     Other   Nonintractable headache - Primary   Relevant Medications   SUMAtriptan (IMITREX) 25 MG tablet   Other Visit Diagnoses     Dizziness       Relevant Medications   losartan (COZAAR) 50 MG tablet   Type 2 diabetes mellitus without complication, without long-term current use of insulin (HCC)       Relevant Medications   losartan (COZAAR) 50 MG tablet   metFORMIN (GLUCOPHAGE) 500 MG tablet   atorvastatin (LIPITOR) 40 MG tablet   Other Relevant Orders   Bayer DCA Hb A1c Waived   Lipid panel   Vitamin B12   Urinalysis, Routine w reflex microscopic   Comprehensive metabolic panel   Microalbumin, Urine Waived   Hyperlipidemia, unspecified hyperlipidemia type       Relevant Medications   losartan (COZAAR) 50 MG tablet   atorvastatin (LIPITOR) 40 MG tablet   Other Relevant Orders   Bayer DCA Hb A1c Waived   Lipid panel   Vitamin B12   Urinalysis, Routine w reflex microscopic   Comprehensive metabolic panel   Microalbumin, Urine Waived        Orders Placed This Encounter  Procedures   Bayer DCA Hb A1c Waived   Lipid panel   Vitamin B12   Urinalysis, Routine w reflex  microscopic   Comprehensive metabolic panel   Microalbumin, Urine Waived     Meds ordered this encounter  Medications   SUMAtriptan (IMITREX) 25 MG tablet    Sig: Take 1 tablet (25 mg total) by mouth every 2 (two) hours as needed for migraine. May repeat in 2 hours if headache persists or recurs. Not to exceeed > 100 mg in 24 hour period    Dispense:  20 tablet    Refill:  2   losartan (COZAAR) 50 MG tablet    Sig: Take 1 tablet (50 mg total) by mouth daily.    Dispense:  90 tablet    Refill:  5   metFORMIN (GLUCOPHAGE) 500 MG tablet    Sig: Take 1 tablet (500 mg total) by mouth daily.    Dispense:  30 tablet    Refill:  3   atorvastatin (LIPITOR) 40 MG tablet    Sig: Take 1 tablet (40 mg total) by mouth daily.    Dispense:  90 tablet    Refill:  3     Follow up plan: Return in about 6 weeks (around 06/01/2021).

## 2021-04-21 ENCOUNTER — Encounter: Payer: Self-pay | Admitting: Internal Medicine

## 2021-05-05 NOTE — Telephone Encounter (Signed)
Accidental encounter. 

## 2021-05-25 ENCOUNTER — Other Ambulatory Visit: Payer: BC Managed Care – PPO

## 2021-05-25 ENCOUNTER — Other Ambulatory Visit: Payer: Self-pay

## 2021-05-25 DIAGNOSIS — E119 Type 2 diabetes mellitus without complications: Secondary | ICD-10-CM

## 2021-05-25 DIAGNOSIS — I10 Essential (primary) hypertension: Secondary | ICD-10-CM

## 2021-05-25 DIAGNOSIS — E785 Hyperlipidemia, unspecified: Secondary | ICD-10-CM

## 2021-05-25 LAB — URINALYSIS, ROUTINE W REFLEX MICROSCOPIC
Bilirubin, UA: NEGATIVE
Glucose, UA: NEGATIVE
Ketones, UA: NEGATIVE
Leukocytes,UA: NEGATIVE
Nitrite, UA: NEGATIVE
Protein,UA: NEGATIVE
RBC, UA: NEGATIVE
Specific Gravity, UA: 1.02 (ref 1.005–1.030)
Urobilinogen, Ur: 0.2 mg/dL (ref 0.2–1.0)
pH, UA: 6 (ref 5.0–7.5)

## 2021-05-25 LAB — MICROALBUMIN, URINE WAIVED
Creatinine, Urine Waived: 100 mg/dL (ref 10–300)
Microalb, Ur Waived: 10 mg/L (ref 0–19)
Microalb/Creat Ratio: <30 mg/g (ref <30)

## 2021-05-25 LAB — BAYER DCA HB A1C WAIVED: HB A1C (BAYER DCA - WAIVED): 6.3 % — ABNORMAL HIGH (ref 4.8–5.6)

## 2021-05-26 LAB — COMPREHENSIVE METABOLIC PANEL
ALT: 25 IU/L (ref 0–32)
AST: 16 IU/L (ref 0–40)
Albumin/Globulin Ratio: 2.3 — ABNORMAL HIGH (ref 1.2–2.2)
Albumin: 4.9 g/dL — ABNORMAL HIGH (ref 3.8–4.8)
Alkaline Phosphatase: 99 IU/L (ref 44–121)
BUN/Creatinine Ratio: 12 (ref 12–28)
BUN: 11 mg/dL (ref 8–27)
Bilirubin Total: 0.8 mg/dL (ref 0.0–1.2)
CO2: 24 mmol/L (ref 20–29)
Calcium: 9.7 mg/dL (ref 8.7–10.3)
Chloride: 102 mmol/L (ref 96–106)
Creatinine, Ser: 0.89 mg/dL (ref 0.57–1.00)
Globulin, Total: 2.1 g/dL (ref 1.5–4.5)
Glucose: 113 mg/dL — ABNORMAL HIGH (ref 70–99)
Potassium: 3.8 mmol/L (ref 3.5–5.2)
Sodium: 141 mmol/L (ref 134–144)
Total Protein: 7 g/dL (ref 6.0–8.5)
eGFR: 74 mL/min/{1.73_m2} (ref >59)

## 2021-05-26 LAB — LIPID PANEL
Chol/HDL Ratio: 3.1 ratio (ref 0.0–4.4)
Cholesterol, Total: 150 mg/dL (ref 100–199)
HDL: 49 mg/dL (ref >39)
LDL Chol Calc (NIH): 78 mg/dL (ref 0–99)
Triglycerides: 128 mg/dL (ref 0–149)
VLDL Cholesterol Cal: 23 mg/dL (ref 5–40)

## 2021-05-26 LAB — VITAMIN B12: Vitamin B-12: 504 pg/mL (ref 232–1245)

## 2021-06-01 ENCOUNTER — Encounter: Payer: Self-pay | Admitting: Internal Medicine

## 2021-06-01 ENCOUNTER — Ambulatory Visit: Payer: BC Managed Care – PPO | Admitting: Internal Medicine

## 2021-06-04 ENCOUNTER — Encounter: Payer: Self-pay | Admitting: Internal Medicine

## 2021-06-04 ENCOUNTER — Telehealth (INDEPENDENT_AMBULATORY_CARE_PROVIDER_SITE_OTHER): Payer: BC Managed Care – PPO | Admitting: Internal Medicine

## 2021-06-04 ENCOUNTER — Other Ambulatory Visit: Payer: Self-pay

## 2021-06-04 VITALS — Temp 99.5°F

## 2021-06-04 DIAGNOSIS — U071 COVID-19: Secondary | ICD-10-CM | POA: Insufficient documentation

## 2021-06-04 MED ORDER — MOLNUPIRAVIR EUA 200MG CAPSULE
4.0000 | ORAL_CAPSULE | Freq: Two times a day (BID) | ORAL | 0 refills | Status: AC
  Filled 2021-06-04: qty 40, 5d supply, fill #0

## 2021-06-04 MED ORDER — LACTINEX PO CHEW
1.0000 | CHEWABLE_TABLET | Freq: Three times a day (TID) | ORAL | 1 refills | Status: DC
  Filled 2021-06-04: qty 20, 7d supply, fill #0

## 2021-06-04 MED ORDER — CHERATUSSIN AC 100-10 MG/5ML PO SOLN: 5.0000 mL | Freq: Every day | ORAL | 0 refills | Status: DC

## 2021-06-04 MED ORDER — FEXOFENADINE HCL 180 MG PO TABS: 180.0000 mg | ORAL_TABLET | Freq: Every day | ORAL | 1 refills | Status: DC

## 2021-06-04 MED ORDER — BENZONATATE 100 MG PO CAPS: 100.0000 mg | ORAL_CAPSULE | Freq: Three times a day (TID) | ORAL | 0 refills | Status: DC | PRN

## 2021-06-04 NOTE — Progress Notes (Addendum)
Temp 99.5 F (37.5 C) (Oral)   LMP 05/09/2014 (Approximate)    Subjective:    Patient ID: Kendra Espinoza, female    DOB: 09-19-1960, 60 y.o.   MRN: [id number]  Chief Complaint  Patient presents with   Covid Positive    Tested positive on Tuesday Morning with 2 home tests, Having cough, fever from 101 to 102, horrible headache, congestion, body aches, and diarrhea. Just had Covid in August 30    HPI: Kendra Espinoza is a 60 y.o. female  Patient presents with: Covid Positive: Tested positive on Tuesday Morning with 2 home tests, Having cough, fever from 101 to 102F -- on Tuesday  horrible headache, congestion, body aches, and diarrhea. Just had Covid in August 30    URI  Associated symptoms include coughing and diarrhea. Pertinent negatives include no congestion. Associated symptoms comments: Yellowish color  41 F today  Took tylenol 7 am .  Diarrhea  This is a new (wednesday had about 6 loose stools and yesterday 3 and today once today , loose watery , blood noted, mucus noted.) problem. The current episode started in the past 7 days. Associated symptoms include coughing and a URI.   Chief Complaint  Patient presents with   Covid Positive    Tested positive on Tuesday Morning with 2 home tests, Having cough, fever from 101 to 102, horrible headache, congestion, body aches, and diarrhea. Just had Covid in August 30    Relevant past medical, surgical, family and social history reviewed and updated as indicated. Interim medical history since our last visit reviewed. Allergies and medications reviewed and updated.  Review of Systems  HENT:  Negative for congestion.   Respiratory:  Positive for cough.   Gastrointestinal:  Positive for diarrhea.   Per HPI unless specifically indicated above     Objective:    Temp 99.5 F (37.5 C) (Oral)   LMP 05/09/2014 (Approximate)   Wt Readings from Last 3 Encounters:  04/20/21 206 lb (93.4 kg)  04/06/21 206 lb (93.4 kg)  11/05/20  199 lb (90.3 kg)    Physical Exam  Unable to peform sec to virtual visit.   Results for orders placed or performed in visit on 05/25/21  Microalbumin, Urine Waived  Result Value Ref Range   Microalb, Ur Waived 10 0 - 19 mg/L   Creatinine, Urine Waived 100 10 - 300 mg/dL   Microalb/Creat Ratio <30 <30 mg/g  Comprehensive metabolic panel  Result Value Ref Range   Glucose 113 (H) 70 - 99 mg/dL   BUN 11 8 - 27 mg/dL   Creatinine, Ser 0.89 0.57 - 1.00 mg/dL   eGFR 74 >59 mL/min/1.73   BUN/Creatinine Ratio 12 12 - 28   Sodium 141 134 - 144 mmol/L   Potassium 3.8 3.5 - 5.2 mmol/L   Chloride 102 96 - 106 mmol/L   CO2 24 20 - 29 mmol/L   Calcium 9.7 8.7 - 10.3 mg/dL   Total Protein 7.0 6.0 - 8.5 g/dL   Albumin 4.9 (H) 3.8 - 4.8 g/dL   Globulin, Total 2.1 1.5 - 4.5 g/dL   Albumin/Globulin Ratio 2.3 (H) 1.2 - 2.2   Bilirubin Total 0.8 0.0 - 1.2 mg/dL   Alkaline Phosphatase 99 44 - 121 IU/L   AST 16 0 - 40 IU/L   ALT 25 0 - 32 IU/L  Urinalysis, Routine w reflex microscopic  Result Value Ref Range   Specific Gravity, UA 1.020 1.005 - 1.030  pH, UA 6.0 5.0 - 7.5   Color, UA Yellow Yellow   Appearance Ur Clear Clear   Leukocytes,UA Negative Negative   Protein,UA Negative Negative/Trace   Glucose, UA Negative Negative   Ketones, UA Negative Negative   RBC, UA Negative Negative   Bilirubin, UA Negative Negative   Urobilinogen, Ur 0.2 0.2 - 1.0 mg/dL   Nitrite, UA Negative Negative  Vitamin B12  Result Value Ref Range   Vitamin B-12 504 232 - 1,245 pg/mL  Lipid panel  Result Value Ref Range   Cholesterol, Total 150 100 - 199 mg/dL   Triglycerides 128 0 - 149 mg/dL   HDL 49 >39 mg/dL   VLDL Cholesterol Cal 23 5 - 40 mg/dL   LDL Chol Calc (NIH) 78 0 - 99 mg/dL   Chol/HDL Ratio 3.1 0.0 - 4.4 ratio  Bayer DCA Hb A1c Waived  Result Value Ref Range   HB A1C (BAYER DCA - WAIVED) 6.3 (H) 4.8 - 5.6 %        Current Outpatient Medications:    atorvastatin (LIPITOR) 40 MG tablet,  Take 1 tablet (40 mg total) by mouth daily., Disp: 90 tablet, Rfl: 3   losartan (COZAAR) 50 MG tablet, Take 1 tablet (50 mg total) by mouth daily., Disp: 90 tablet, Rfl: 5   metFORMIN (GLUCOPHAGE) 500 MG tablet, Take 1 tablet (500 mg total) by mouth daily., Disp: 30 tablet, Rfl: 3   topiramate (TOPAMAX) 25 MG tablet, Take 1 tablet (25 mg total) by mouth 2 (two) times daily., Disp: 180 tablet, Rfl: 1    Assessment & Plan:  COVID : positive :  increase fluid intake.  Headahce - tyelnol every 4-6 hrs prn and alternate this with ibubrufen 800 mg q 8 hrly. Sinus pressure: use steam inhalation.  OTC -  Allegra / claritin.  5 days quarantine.  Ok to rtw in 5 days if tests -ve follow  Pt verbalized understanding of such, to get to the office at today and get a curb side test for the above.  Problem List Items Addressed This Visit   None    No orders of the defined types were placed in this encounter.    Meds ordered this encounter  Medications   guaiFENesin-codeine (CHERATUSSIN AC) 100-10 MG/5ML syrup    Sig: Take 5 mLs by mouth at bedtime.    Dispense:  120 mL    Refill:  0   benzonatate (TESSALON) 100 MG capsule    Sig: Take 1 capsule (100 mg total) by mouth 3 (three) times daily as needed for cough. 1- 2 capsules    Dispense:  20 capsule    Refill:  0   molnupiravir EUA (LAGEVRIO) 200 mg CAPS capsule    Sig: Take 4 capsules (800 mg total) by mouth 2 (two) times daily for 5 days.    Dispense:  40 capsule    Refill:  0   lactobacillus acidophilus & bulgar (LACTINEX) chewable tablet    Sig: Chew 1 tablet by mouth 3 (three) times daily with meals.    Dispense:  20 tablet    Refill:  1     Follow up plan: No follow-ups on file.   This visit was completed via video visit through MyChart due to the restrictions of the COVID-19 pandemic. All issues as above were discussed and addressed. Physical exam was done as above through visual confirmation on video through MyChart. If it was  felt that the patient should be evaluated in  the office, they were directed there. The patient verbally consented to this visit. Location of the patient: home Location of the provider: work Those involved with this call:  Provider: Charlynne Cousins, MD CMA: Frazier Butt, Maury Desk/Registration: Myrlene Broker  Time spent on call:  10 minutes with patient face to face via video conference. More than 50% of this time was spent in counseling and coordination of care. 10 minutes total spent in review of patient's record and preparation of their chart.

## 2021-06-07 ENCOUNTER — Encounter: Payer: Self-pay | Admitting: Internal Medicine

## 2021-06-07 NOTE — Telephone Encounter (Signed)
Pl send her a note to stay home until today and ok to go back Tuesday Thnx.

## 2021-06-08 ENCOUNTER — Other Ambulatory Visit: Payer: Self-pay

## 2021-06-09 ENCOUNTER — Ambulatory Visit: Payer: BC Managed Care – PPO | Admitting: Internal Medicine

## 2021-06-14 ENCOUNTER — Ambulatory Visit
Admission: RE | Admit: 2021-06-14 | Discharge: 2021-06-14 | Disposition: A | Discharge status: Home / Self Care | Payer: BC Managed Care – PPO | Source: Ambulatory Visit | Attending: Internal Medicine | Admitting: Internal Medicine

## 2021-06-14 ENCOUNTER — Ambulatory Visit
Admission: RE | Admit: 2021-06-14 | Discharge: 2021-06-14 | Disposition: A | Discharge status: Home / Self Care | Payer: BC Managed Care – PPO | Attending: Internal Medicine | Admitting: Internal Medicine

## 2021-06-14 ENCOUNTER — Ambulatory Visit: Payer: BC Managed Care – PPO | Admitting: Internal Medicine

## 2021-06-14 ENCOUNTER — Other Ambulatory Visit: Payer: Self-pay

## 2021-06-14 ENCOUNTER — Encounter: Payer: Self-pay | Admitting: Internal Medicine

## 2021-06-14 VITALS — BP 140/85 | HR 61 | Temp 98.3°F | Ht 64.17 in | Wt 204.4 lb

## 2021-06-14 DIAGNOSIS — U099 Post covid-19 condition, unspecified: Secondary | ICD-10-CM | POA: Insufficient documentation

## 2021-06-14 DIAGNOSIS — I10 Essential (primary) hypertension: Secondary | ICD-10-CM | POA: Diagnosis not present

## 2021-06-14 DIAGNOSIS — E6609 Other obesity due to excess calories: Secondary | ICD-10-CM | POA: Diagnosis not present

## 2021-06-14 DIAGNOSIS — Z6835 Body mass index (BMI) 35.0-35.9, adult: Secondary | ICD-10-CM

## 2021-06-14 DIAGNOSIS — E1169 Type 2 diabetes mellitus with other specified complication: Secondary | ICD-10-CM | POA: Insufficient documentation

## 2021-06-14 DIAGNOSIS — R053 Chronic cough: Secondary | ICD-10-CM | POA: Insufficient documentation

## 2021-06-14 DIAGNOSIS — E119 Type 2 diabetes mellitus without complications: Secondary | ICD-10-CM

## 2021-06-14 DIAGNOSIS — E118 Type 2 diabetes mellitus with unspecified complications: Secondary | ICD-10-CM | POA: Insufficient documentation

## 2021-06-14 MED ORDER — PREGABALIN 50 MG PO CAPS: 50.0000 mg | ORAL_CAPSULE | Freq: Every day | ORAL | 4 refills | Status: DC

## 2021-06-14 MED ORDER — MUPIROCIN 2 % EX OINT: 1.0000 "application " | TOPICAL_OINTMENT | Freq: Two times a day (BID) | CUTANEOUS | 0 refills | Status: DC

## 2021-06-14 MED ORDER — BENZONATATE 100 MG PO CAPS: 100.0000 mg | ORAL_CAPSULE | Freq: Two times a day (BID) | ORAL | 0 refills | Status: DC | PRN

## 2021-06-14 MED ORDER — AMOXICILLIN-POT CLAVULANATE 875-125 MG PO TABS: 1.0000 | ORAL_TABLET | Freq: Two times a day (BID) | ORAL | 0 refills | Status: AC

## 2021-06-14 NOTE — Progress Notes (Signed)
BP 140/85   Pulse 61   Temp 98.3 F (36.8 C) (Oral)   Ht 5' 4.17" (1.63 m)   Wt 204 lb 6.4 oz (92.7 kg)   LMP 05/09/2014 (Approximate)   SpO2 98%   BMI 34.90 kg/m    Subjective:    Patient ID: Kendra Espinoza, female    DOB: 18-Nov-1960, 60 y.o.   MRN: [id number]  Chief Complaint  Patient presents with   labwork   Pruritis    Left post shoulder   paperwork    Would like to have FMLA paperwork filled out for Covid    HPI: Kendra Espinoza is a 60 y.o. female  Had covid 11/18 still symptomatic -  Has had shingles x on face feb / march this year. Has some itching in the left shoulder pain and rash   Diabetes She presents for her follow-up (a1c 6.3 better from 6.5 has been eating healthy says she was about 206 lbs - 199 lbs at home.) diabetic visit. She has type 2 diabetes mellitus. Her disease course has been improving. Pertinent negatives for hypoglycemia include no dizziness. Pertinent negatives for diabetes include no blurred vision, no chest pain, no fatigue, no foot paresthesias, no foot ulcerations, no polydipsia, no polyphagia, no polyuria, no visual change, no weakness and no weight loss. Symptoms are improving.   Chief Complaint  Patient presents with   labwork   Pruritis    Left post shoulder   paperwork    Would like to have FMLA paperwork filled out for Covid    Relevant past medical, surgical, family and social history reviewed and updated as indicated. Interim medical history since our last visit reviewed. Allergies and medications reviewed and updated.  Review of Systems  Constitutional:  Positive for appetite change. Negative for activity change, fatigue, fever and weight loss.  HENT:  Positive for congestion, rhinorrhea, sinus pressure, sinus pain and sore throat. Negative for ear discharge, ear pain, tinnitus, trouble swallowing and voice change.   Eyes:  Negative for blurred vision.  Respiratory:  Positive for cough. Negative for apnea, choking, chest  tightness, shortness of breath and stridor.   Cardiovascular:  Negative for chest pain.  Endocrine: Negative for polydipsia, polyphagia and polyuria.  Neurological:  Negative for dizziness and weakness.   Per HPI unless specifically indicated above     Objective:    BP 140/85   Pulse 61   Temp 98.3 F (36.8 C) (Oral)   Ht 5' 4.17" (1.63 m)   Wt 204 lb 6.4 oz (92.7 kg)   LMP 05/09/2014 (Approximate)   SpO2 98%   BMI 34.90 kg/m   Wt Readings from Last 3 Encounters:  06/14/21 204 lb 6.4 oz (92.7 kg)  04/20/21 206 lb (93.4 kg)  04/06/21 206 lb (93.4 kg)    Physical Exam  Results for orders placed or performed in visit on 05/25/21  Microalbumin, Urine Waived  Result Value Ref Range   Microalb, Ur Waived 10 0 - 19 mg/L   Creatinine, Urine Waived 100 10 - 300 mg/dL   Microalb/Creat Ratio <30 <30 mg/g  Comprehensive metabolic panel  Result Value Ref Range   Glucose 113 (H) 70 - 99 mg/dL   BUN 11 8 - 27 mg/dL   Creatinine, Ser 0.89 0.57 - 1.00 mg/dL   eGFR 74 >59 mL/min/1.73   BUN/Creatinine Ratio 12 12 - 28   Sodium 141 134 - 144 mmol/L   Potassium 3.8 3.5 - 5.2 mmol/L  Chloride 102 96 - 106 mmol/L   CO2 24 20 - 29 mmol/L   Calcium 9.7 8.7 - 10.3 mg/dL   Total Protein 7.0 6.0 - 8.5 g/dL   Albumin 4.9 (H) 3.8 - 4.8 g/dL   Globulin, Total 2.1 1.5 - 4.5 g/dL   Albumin/Globulin Ratio 2.3 (H) 1.2 - 2.2   Bilirubin Total 0.8 0.0 - 1.2 mg/dL   Alkaline Phosphatase 99 44 - 121 IU/L   AST 16 0 - 40 IU/L   ALT 25 0 - 32 IU/L  Urinalysis, Routine w reflex microscopic  Result Value Ref Range   Specific Gravity, UA 1.020 1.005 - 1.030   pH, UA 6.0 5.0 - 7.5   Color, UA Yellow Yellow   Appearance Ur Clear Clear   Leukocytes,UA Negative Negative   Protein,UA Negative Negative/Trace   Glucose, UA Negative Negative   Ketones, UA Negative Negative   RBC, UA Negative Negative   Bilirubin, UA Negative Negative   Urobilinogen, Ur 0.2 0.2 - 1.0 mg/dL   Nitrite, UA Negative  Negative  Vitamin B12  Result Value Ref Range   Vitamin B-12 504 232 - 1,245 pg/mL  Lipid panel  Result Value Ref Range   Cholesterol, Total 150 100 - 199 mg/dL   Triglycerides 128 0 - 149 mg/dL   HDL 49 >39 mg/dL   VLDL Cholesterol Cal 23 5 - 40 mg/dL   LDL Chol Calc (NIH) 78 0 - 99 mg/dL   Chol/HDL Ratio 3.1 0.0 - 4.4 ratio  Bayer DCA Hb A1c Waived  Result Value Ref Range   HB A1C (BAYER DCA - WAIVED) 6.3 (H) 4.8 - 5.6 %        Current Outpatient Medications:    atorvastatin (LIPITOR) 40 MG tablet, Take 1 tablet (40 mg total) by mouth daily., Disp: 90 tablet, Rfl: 3   benzonatate (TESSALON) 100 MG capsule, Take 1 capsule (100 mg total) by mouth 3 (three) times daily as needed for cough. 1- 2 capsules, Disp: 20 capsule, Rfl: 0   fexofenadine (ALLEGRA ALLERGY) 180 MG tablet, Take 1 tablet (180 mg total) by mouth daily., Disp: 10 tablet, Rfl: 1   guaiFENesin-codeine (CHERATUSSIN AC) 100-10 MG/5ML syrup, Take 5 mLs by mouth at bedtime., Disp: 120 mL, Rfl: 0   lactobacillus acidophilus & bulgar (LACTINEX) chewable tablet, Chew 1 tablet by mouth 3 (three) times daily with meals., Disp: 20 tablet, Rfl: 1   losartan (COZAAR) 50 MG tablet, Take 1 tablet (50 mg total) by mouth daily., Disp: 90 tablet, Rfl: 5   metFORMIN (GLUCOPHAGE) 500 MG tablet, Take 1 tablet (500 mg total) by mouth daily., Disp: 30 tablet, Rfl: 3   topiramate (TOPAMAX) 25 MG tablet, Take 1 tablet (25 mg total) by mouth 2 (two) times daily., Disp: 180 tablet, Rfl: 1    Assessment & Plan:  Post COVID :will send in Augmentin pt advised to take Tylenol q 4- 6 hourly as needed. pt to take allegra q pm as needed and to call office if symptoms worsened pt verbalised understanding of such Check CXR  Meds ordered this encounter  Medications   amoxicillin-clavulanate (AUGMENTIN) 875-125 MG tablet    Sig: Take 1 tablet by mouth 2 (two) times daily for 7 days.    Dispense:  14 tablet    Refill:  0   benzonatate (TESSALON) 100  MG capsule    Sig: Take 1 capsule (100 mg total) by mouth 2 (two) times daily as needed for cough.  Dispense:  20 capsule    Refill:  0   mupirocin ointment (BACTROBAN) 2 %    Sig: Apply 1 application topically 2 (two) times daily.    Dispense:  22 g    Refill:  0   pregabalin (LYRICA) 50 MG capsule    Sig: Take 1 capsule (50 mg total) by mouth daily.    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   3.  Diabetes stable improving A1c 6.3 from 6.5.  Fasting sugars much improved recheck HbA1c,  urine  microalbumin  diabetic diet plan given to pt  adviced regarding hypoglycemia and instructions given to pt today on how to prevent and treat the same if it were to occur. pt acknowledges the plan and voices understanding of the same.  exercise plan given and encouraged.   advice diabetic yearly podiatry, ophthalmology , nutritionist , dental check q 6 months,   Problem List Items Addressed This Visit   None    Orders Placed This Encounter  Procedures   DG Chest 2 View     Meds ordered this encounter  Medications   amoxicillin-clavulanate (AUGMENTIN) 875-125 MG tablet    Sig: Take 1 tablet by mouth 2 (two) times daily for 7 days.    Dispense:  14 tablet    Refill:  0   benzonatate (TESSALON) 100 MG capsule    Sig: Take 1 capsule (100 mg total) by mouth 2 (two) times daily as needed for cough.    Dispense:  20 capsule    Refill:  0   mupirocin ointment (BACTROBAN) 2 %    Sig: Apply 1 application topically 2 (two) times daily.    Dispense:  22 g    Refill:  0   pregabalin (LYRICA) 50 MG capsule    Sig: Take 1 capsule (50 mg total) by mouth daily.    Dispense:  30 capsule    Refill:  4     Follow up plan:

## 2021-06-14 NOTE — Progress Notes (Signed)
Please let pt know this was normal.

## 2021-06-15 ENCOUNTER — Telehealth: Payer: Self-pay

## 2021-06-15 NOTE — Telephone Encounter (Signed)
Left VM for patient informing her that her paperwork was faxed to [facility] and that the paperwork is at the front desk for her to pick up

## 2021-07-05 ENCOUNTER — Telehealth: Payer: BC Managed Care – PPO | Admitting: Internal Medicine

## 2021-07-23 ENCOUNTER — Telehealth: Payer: BC Managed Care – PPO | Admitting: Internal Medicine

## 2021-07-26 ENCOUNTER — Telehealth (INDEPENDENT_AMBULATORY_CARE_PROVIDER_SITE_OTHER): Payer: BC Managed Care – PPO | Admitting: Internal Medicine

## 2021-07-26 ENCOUNTER — Encounter: Payer: Self-pay | Admitting: Internal Medicine

## 2021-07-26 VITALS — BP 135/89 | HR 84 | Temp 98.7°F

## 2021-07-26 DIAGNOSIS — E1169 Type 2 diabetes mellitus with other specified complication: Secondary | ICD-10-CM | POA: Insufficient documentation

## 2021-07-26 DIAGNOSIS — E119 Type 2 diabetes mellitus without complications: Secondary | ICD-10-CM

## 2021-07-26 DIAGNOSIS — E785 Hyperlipidemia, unspecified: Secondary | ICD-10-CM | POA: Diagnosis not present

## 2021-07-26 DIAGNOSIS — I10 Essential (primary) hypertension: Secondary | ICD-10-CM | POA: Diagnosis not present

## 2021-07-26 NOTE — Progress Notes (Signed)
BP 135/89    Pulse 84    Temp 98.7 F (37.1 C) (Oral)    LMP 05/09/2014 (Approximate)    Subjective:    Patient ID: Kendra Espinoza, female    DOB: 11/09/1960, 61 y.o.   MRN: [id number]  Chief Complaint  Patient presents with   Follow-up    Pt states she is doing much better. States she has been exposed to Covid again last week, no symptoms currently per patient.     HPI: Kendra Espinoza is a 61 y.o. female   This visit was completed via video visit through MyChart due to the restrictions of the COVID-19 pandemic. All issues as above were discussed and addressed. Physical exam was done as above through visual confirmation on video through MyChart. If it was felt that the patient should be evaluated in the office, they were directed there. The patient verbally consented to this visit. Location of the patient: home Location of the provider: work Those involved with this call:  Provider: Charlynne Cousins, MD CMA: Frazier Butt, [place] Desk/Registration: Myrlene Broker  Time spent on call: 15 minutes with patient face to face via video conference. More than 50% of this time was spent in counseling and coordination of care. 15 minutes total spent in review of patient's record and preparation of their chart.  Pt had COVID nov 18, feels better Hasnt had labs since nov Mom has COVID x 1 week ago, she says she home tested herself and feels better and is -ve.   Chief Complaint  Patient presents with   Follow-up    Pt states she is doing much better. States she has been exposed to Covid again last week, no symptoms currently per patient.     Relevant past medical, surgical, family and social history reviewed and updated as indicated. Interim medical history since our last visit reviewed. Allergies and medications reviewed and updated.  Review of Systems  Per HPI unless specifically indicated above     Objective:    BP 135/89    Pulse 84    Temp 98.7 F (37.1 C) (Oral)    LMP  05/09/2014 (Approximate)   Wt Readings from Last 3 Encounters:  06/14/21 204 lb 6.4 oz (92.7 kg)  04/20/21 206 lb (93.4 kg)  04/06/21 206 lb (93.4 kg)    Physical Exam Vitals and nursing note reviewed.  Constitutional:      General: She is not in acute distress.    Appearance: Normal appearance. She is not ill-appearing or diaphoretic.  Eyes:     Conjunctiva/sclera: Conjunctivae normal.  Cardiovascular:     Rate and Rhythm: Normal rate and regular rhythm.     Heart sounds: No murmur heard.   No friction rub.  Pulmonary:     Effort: Pulmonary effort is normal.     Breath sounds: No rhonchi.  Abdominal:     General: Bowel sounds are normal.     Tenderness: There is no abdominal tenderness. There is no guarding.  Musculoskeletal:        General: No swelling, tenderness or deformity.  Skin:    General: Skin is warm and dry.     Coloration: Skin is not jaundiced.     Findings: No erythema.  Neurological:     Mental Status: She is alert.  Psychiatric:        Mood and Affect: Mood normal.        Behavior: Behavior normal.  Thought Content: Thought content normal.    Results for orders placed or performed in visit on 05/25/21  Microalbumin, Urine Waived  Result Value Ref Range   Microalb, Ur Waived 10 0 - 19 mg/L   Creatinine, Urine Waived 100 10 - 300 mg/dL   Microalb/Creat Ratio <30 <30 mg/g  Comprehensive metabolic panel  Result Value Ref Range   Glucose 113 (H) 70 - 99 mg/dL   BUN 11 8 - 27 mg/dL   Creatinine, Ser 0.89 0.57 - 1.00 mg/dL   eGFR 74 >59 mL/min/1.73   BUN/Creatinine Ratio 12 12 - 28   Sodium 141 134 - 144 mmol/L   Potassium 3.8 3.5 - 5.2 mmol/L   Chloride 102 96 - 106 mmol/L   CO2 24 20 - 29 mmol/L   Calcium 9.7 8.7 - 10.3 mg/dL   Total Protein 7.0 6.0 - 8.5 g/dL   Albumin 4.9 (H) 3.8 - 4.8 g/dL   Globulin, Total 2.1 1.5 - 4.5 g/dL   Albumin/Globulin Ratio 2.3 (H) 1.2 - 2.2   Bilirubin Total 0.8 0.0 - 1.2 mg/dL   Alkaline Phosphatase 99 44 -  121 IU/L   AST 16 0 - 40 IU/L   ALT 25 0 - 32 IU/L  Urinalysis, Routine w reflex microscopic  Result Value Ref Range   Specific Gravity, UA 1.020 1.005 - 1.030   pH, UA 6.0 5.0 - 7.5   Color, UA Yellow Yellow   Appearance Ur Clear Clear   Leukocytes,UA Negative Negative   Protein,UA Negative Negative/Trace   Glucose, UA Negative Negative   Ketones, UA Negative Negative   RBC, UA Negative Negative   Bilirubin, UA Negative Negative   Urobilinogen, Ur 0.2 0.2 - 1.0 mg/dL   Nitrite, UA Negative Negative  Vitamin B12  Result Value Ref Range   Vitamin B-12 504 232 - 1,245 pg/mL  Lipid panel  Result Value Ref Range   Cholesterol, Total 150 100 - 199 mg/dL   Triglycerides 128 0 - 149 mg/dL   HDL 49 >39 mg/dL   VLDL Cholesterol Cal 23 5 - 40 mg/dL   LDL Chol Calc (NIH) 78 0 - 99 mg/dL   Chol/HDL Ratio 3.1 0.0 - 4.4 ratio  Bayer DCA Hb A1c Waived  Result Value Ref Range   HB A1C (BAYER DCA - WAIVED) 6.3 (H) 4.8 - 5.6 %        Current Outpatient Medications:    atorvastatin (LIPITOR) 40 MG tablet, Take 1 tablet (40 mg total) by mouth daily., Disp: 90 tablet, Rfl: 3   fexofenadine (ALLEGRA ALLERGY) 180 MG tablet, Take 1 tablet (180 mg total) by mouth daily., Disp: 10 tablet, Rfl: 1   losartan (COZAAR) 50 MG tablet, Take 1 tablet (50 mg total) by mouth daily., Disp: 90 tablet, Rfl: 5   metFORMIN (GLUCOPHAGE) 500 MG tablet, Take 1 tablet (500 mg total) by mouth daily., Disp: 30 tablet, Rfl: 3   topiramate (TOPAMAX) 25 MG tablet, Take 1 tablet (25 mg total) by mouth 2 (two) times daily., Disp: 180 tablet, Rfl: 1    Assessment & Plan:  Dm  ; Is only on metfromin 500 mg daily Check fSBS every other day at home  Supplies sent in  check HbA1c,  urine  microalbumin  diabetic diet plan given to pt  adviced regarding hypoglycemia and instructions given to pt today on how to prevent and treat the same if it were to occur. pt acknowledges the plan and voices understanding of the  same.   exercise plan given and encouraged.   advice diabetic yearly podiatry, ophthalmology , nutritionist , dental check q 6 months,  HLD is on lipitor for such recheck FLP, check LFT's work on diet, SE of meds explained to pt. low fat and high fiber diet explained to pt.  Htn stable. Is on losartan continue  Continue current meds.  Medication compliance emphasised. pt advised to keep Bp logs. Pt verbalised understanding of the same. Pt to have a low salt diet . Exercise to reach a goal of at least 150 mins a week.  lifestyle modifications explained and pt understands importance of the above.  COVID -ve now feels better   Problem List Items Addressed This Visit       Endocrine   Diabetes mellitus without complication ([place]) - Primary   Relevant Orders   CBC with Differential/Platelet   Comprehensive metabolic panel   TSH   Bayer DCA Hb A1c Waived   Lipid panel   Basic metabolic panel   Vitamin J18   Urinalysis, Routine w reflex microscopic     Orders Placed This Encounter  Procedures   CBC with Differential/Platelet   Comprehensive metabolic panel   TSH   Bayer DCA Hb A1c Waived   Lipid panel   Basic metabolic panel   Vitamin T99   Urinalysis, Routine w reflex microscopic     No orders of the defined types were placed in this encounter.    Follow up plan: Return in about 4 weeks (around 08/23/2021).

## 2021-08-06 ENCOUNTER — Ambulatory Visit: Payer: BC Managed Care – PPO | Admitting: Nurse Practitioner

## 2021-08-06 ENCOUNTER — Other Ambulatory Visit: Payer: Self-pay

## 2021-08-06 ENCOUNTER — Encounter: Payer: Self-pay | Admitting: Nurse Practitioner

## 2021-08-06 ENCOUNTER — Ambulatory Visit: Payer: BC Managed Care – PPO | Admitting: Internal Medicine

## 2021-08-06 VITALS — BP 127/80 | HR 90 | Temp 98.0°F | Wt 201.8 lb

## 2021-08-06 DIAGNOSIS — M5441 Lumbago with sciatica, right side: Secondary | ICD-10-CM

## 2021-08-06 MED ORDER — KETOROLAC TROMETHAMINE 60 MG/2ML IM SOLN
60.0000 mg | Freq: Once | INTRAMUSCULAR | Status: AC
  Administered 2021-08-06: 60 mg via INTRAMUSCULAR

## 2021-08-06 MED ORDER — IBUPROFEN 600 MG PO TABS: 600.0000 mg | ORAL_TABLET | Freq: Three times a day (TID) | ORAL | 0 refills | Status: DC | PRN

## 2021-08-06 MED ORDER — PREDNISONE 10 MG PO TABS: 10.0000 mg | ORAL_TABLET | Freq: Every day | ORAL | 0 refills | Status: DC

## 2021-08-06 NOTE — Progress Notes (Signed)
BP 127/80    Pulse 90    Temp 98 F (36.7 C) (Oral)    Wt 201 lb 12.8 oz (91.5 kg)    LMP 05/09/2014 (Approximate)    SpO2 98%    BMI 34.45 kg/m    Subjective:    Patient ID: Kendra Espinoza, female    DOB: [birth year], 61 y.o.   MRN: [id number]  HPI: Kendra Espinoza is a 61 y.o. female  Chief Complaint  Patient presents with   Back Pain   BACK PAIN Duration: days Mechanism of injury: no trauma Location: Left and low back Onset: sudden Severity: 7/10 Quality: sharp and shooting Frequency: intermittent Radiation: R leg above the knee Aggravating factors: movement and walking Alleviating factors:  tylenol and rest Status: better Treatments attempted:  tylenol, rest, and heat  Relief with NSAIDs?: No NSAIDs Taken Nighttime pain:  yes Paresthesias / decreased sensation:   a little tingling sometimes Bowel / bladder incontinence:  no Fevers:  no Dysuria / urinary frequency:  no  Relevant past medical, surgical, family and social history reviewed and updated as indicated. Interim medical history since our last visit reviewed. Allergies and medications reviewed and updated.  Review of Systems  Musculoskeletal:  Positive for back pain.   Per HPI unless specifically indicated above     Objective:    BP 127/80    Pulse 90    Temp 98 F (36.7 C) (Oral)    Wt 201 lb 12.8 oz (91.5 kg)    LMP 05/09/2014 (Approximate)    SpO2 98%    BMI 34.45 kg/m   Wt Readings from Last 3 Encounters:  08/06/21 201 lb 12.8 oz (91.5 kg)  06/14/21 204 lb 6.4 oz (92.7 kg)  04/20/21 206 lb (93.4 kg)    Physical Exam Vitals and nursing note reviewed.  Constitutional:      General: She is not in acute distress.    Appearance: Normal appearance. She is normal weight. She is not ill-appearing, toxic-appearing or diaphoretic.  HENT:     Head: Normocephalic.     Right Ear: External ear normal.     Left Ear: External ear normal.     Nose: Nose normal.     Mouth/Throat:     Mouth: Mucous membranes  are moist.     Pharynx: Oropharynx is clear.  Eyes:     General:        Right eye: No discharge.        Left eye: No discharge.     Extraocular Movements: Extraocular movements intact.     Conjunctiva/sclera: Conjunctivae normal.     Pupils: Pupils are equal, round, and reactive to light.  Cardiovascular:     Rate and Rhythm: Normal rate and regular rhythm.     Heart sounds: No murmur heard. Pulmonary:     Effort: Pulmonary effort is normal. No respiratory distress.     Breath sounds: Normal breath sounds. No wheezing or rales.  Musculoskeletal:     Cervical back: Normal range of motion and neck supple.     Lumbar back: No swelling, edema, deformity, signs of trauma, lacerations, spasms, tenderness or bony tenderness. Decreased range of motion. No scoliosis.  Skin:    General: Skin is warm and dry.     Capillary Refill: Capillary refill takes less than 2 seconds.  Neurological:     General: No focal deficit present.     Mental Status: She is alert and oriented to person, place, and time.  Mental status is at baseline.  Psychiatric:        Mood and Affect: Mood normal.        Behavior: Behavior normal.        Thought Content: Thought content normal.        Judgment: Judgment normal.    Results for orders placed or performed in visit on 05/25/21  Microalbumin, Urine Waived  Result Value Ref Range   Microalb, Ur Waived 10 0 - 19 mg/L   Creatinine, Urine Waived 100 10 - 300 mg/dL   Microalb/Creat Ratio <30 <30 mg/g  Comprehensive metabolic panel  Result Value Ref Range   Glucose 113 (H) 70 - 99 mg/dL   BUN 11 8 - 27 mg/dL   Creatinine, Ser 0.89 0.57 - 1.00 mg/dL   eGFR 74 >59 mL/min/1.73   BUN/Creatinine Ratio 12 12 - 28   Sodium 141 134 - 144 mmol/L   Potassium 3.8 3.5 - 5.2 mmol/L   Chloride 102 96 - 106 mmol/L   CO2 24 20 - 29 mmol/L   Calcium 9.7 8.7 - 10.3 mg/dL   Total Protein 7.0 6.0 - 8.5 g/dL   Albumin 4.9 (H) 3.8 - 4.8 g/dL   Globulin, Total 2.1 1.5 - 4.5 g/dL    Albumin/Globulin Ratio 2.3 (H) 1.2 - 2.2   Bilirubin Total 0.8 0.0 - 1.2 mg/dL   Alkaline Phosphatase 99 44 - 121 IU/L   AST 16 0 - 40 IU/L   ALT 25 0 - 32 IU/L  Urinalysis, Routine w reflex microscopic  Result Value Ref Range   Specific Gravity, UA 1.020 1.005 - 1.030   pH, UA 6.0 5.0 - 7.5   Color, UA Yellow Yellow   Appearance Ur Clear Clear   Leukocytes,UA Negative Negative   Protein,UA Negative Negative/Trace   Glucose, UA Negative Negative   Ketones, UA Negative Negative   RBC, UA Negative Negative   Bilirubin, UA Negative Negative   Urobilinogen, Ur 0.2 0.2 - 1.0 mg/dL   Nitrite, UA Negative Negative  Vitamin B12  Result Value Ref Range   Vitamin B-12 504 232 - 1,245 pg/mL  Lipid panel  Result Value Ref Range   Cholesterol, Total 150 100 - 199 mg/dL   Triglycerides 128 0 - 149 mg/dL   HDL 49 >39 mg/dL   VLDL Cholesterol Cal 23 5 - 40 mg/dL   LDL Chol Calc (NIH) 78 0 - 99 mg/dL   Chol/HDL Ratio 3.1 0.0 - 4.4 ratio  Bayer DCA Hb A1c Waived  Result Value Ref Range   HB A1C (BAYER DCA - WAIVED) 6.3 (H) 4.8 - 5.6 %      Assessment & Plan:   Problem List Items Addressed This Visit   None Visit Diagnoses     Acute right-sided low back pain with right-sided sciatica    -  Primary   Toradol given in office today. Prednisone taper given. Recommend Ibuprofen 641m TID for the next 3 days. FU if pain does not improve.   Relevant Medications   predniSONE (DELTASONE) 10 MG tablet   ibuprofen (ADVIL) 600 MG tablet   ketorolac (TORADOL) injection 60 mg (Start on 08/06/2021  3:30 PM)        Follow up plan: Return if symptoms worsen or fail to improve.

## 2021-08-09 ENCOUNTER — Encounter: Payer: Self-pay | Admitting: Nurse Practitioner

## 2021-08-10 ENCOUNTER — Ambulatory Visit: Payer: BC Managed Care – PPO | Admitting: Internal Medicine

## 2021-08-18 ENCOUNTER — Encounter: Payer: Self-pay | Admitting: Internal Medicine

## 2021-08-18 ENCOUNTER — Telehealth (INDEPENDENT_AMBULATORY_CARE_PROVIDER_SITE_OTHER): Payer: BC Managed Care – PPO | Admitting: Internal Medicine

## 2021-08-18 VITALS — BP 155/95 | HR 92 | Temp 99.8°F

## 2021-08-18 DIAGNOSIS — R052 Subacute cough: Secondary | ICD-10-CM

## 2021-08-18 DIAGNOSIS — U071 COVID-19: Secondary | ICD-10-CM | POA: Diagnosis not present

## 2021-08-18 MED ORDER — AZITHROMYCIN 250 MG PO TABS: ORAL_TABLET | ORAL | 0 refills | Status: AC

## 2021-08-18 MED ORDER — BENZONATATE 100 MG PO CAPS: 100.0000 mg | ORAL_CAPSULE | Freq: Three times a day (TID) | ORAL | 0 refills | Status: DC | PRN

## 2021-08-18 MED ORDER — CHERATUSSIN AC 100-10 MG/5ML PO SOLN: 5.0000 mL | Freq: Three times a day (TID) | ORAL | 0 refills | Status: DC | PRN

## 2021-08-18 MED ORDER — FEXOFENADINE HCL 180 MG PO TABS: 180.0000 mg | ORAL_TABLET | Freq: Every day | ORAL | 1 refills | Status: DC

## 2021-08-18 MED ORDER — CHERATUSSIN AC 100-10 MG/5ML PO SOLN: 5.0000 mL | Freq: Every evening | ORAL | 0 refills | Status: DC

## 2021-08-18 NOTE — Progress Notes (Signed)
BP (!) 155/95    Pulse 92    Temp 99.8 F (37.7 C) (Tympanic)    LMP 05/09/2014 (Approximate)    Subjective:    Patient ID: Kendra Espinoza, female    DOB: [birth year], 61 y.o.   MRN: [id number]  Chief Complaint  Patient presents with   Covid Positive    Tested positive on last Friday night. Symptoms are Cough, headache, runny nose, and body aches. Has been taking nyquil day and night, and tylenolol.     HPI: SAUSHA Espinoza is a 61 y.o. female   This visit was completed via telephone due to the restrictions of the COVID-19 pandemic. All issues as above were discussed and addressed but no physical exam was performed. If it was felt that the patient should be evaluated in the office, they were directed there. The patient verbally consented to this visit. Patient was unable to complete an audio/visual visit due to Technical difficulties. Due to the catastrophic nature of the COVID-19 pandemic, this visit was done through audio contact only. Location of the patient: home Location of the provider: work Those involved with this call:  Provider: Charlynne Cousins, MD CMA: Frazier Butt, [place] Desk/Registration: [employer]  Time spent on call: 10 minutes on the phone discussing health concerns. 10 minutes total spent in review of patient's record and preparation of their chart.  Patient presents with: Covid Positive: Tested positive on last Friday night. Symptoms are Cough, headache, runny nose, and body aches. Has been taking nyquil day and night, and tylenolol.     Cough This is a new problem. The current episode started 1 to 4 weeks ago. Associated symptoms include chills, headaches, myalgias and nasal congestion. Pertinent negatives include no chest pain, ear congestion, ear pain, heartburn, hemoptysis, postnasal drip, rash, rhinorrhea, sore throat, shortness of breath, sweats, weight loss or wheezing.  Headache  This is a chronic problem. Associated symptoms include coughing. Pertinent  negatives include no ear pain, rhinorrhea, sore throat or weight loss.   Chief Complaint  Patient presents with   Covid Positive    Tested positive on last Friday night. Symptoms are Cough, headache, runny nose, and body aches. Has been taking nyquil day and night, and tylenolol.     Relevant past medical, surgical, family and social history reviewed and updated as indicated. Interim medical history since our last visit reviewed. Allergies and medications reviewed and updated.  Review of Systems  Constitutional:  Positive for chills. Negative for weight loss.  HENT:  Negative for ear pain, postnasal drip, rhinorrhea and sore throat.   Respiratory:  Positive for cough. Negative for hemoptysis, shortness of breath and wheezing.   Cardiovascular:  Negative for chest pain.  Gastrointestinal:  Negative for heartburn.  Musculoskeletal:  Positive for myalgias.  Skin:  Negative for rash.  Neurological:  Positive for headaches.   Per HPI unless specifically indicated above     Objective:    BP (!) 155/95    Pulse 92    Temp 99.8 F (37.7 C) (Tympanic)    LMP 05/09/2014 (Approximate)   Wt Readings from Last 3 Encounters:  08/06/21 201 lb 12.8 oz (91.5 kg)  06/14/21 204 lb 6.4 oz (92.7 kg)  04/20/21 206 lb (93.4 kg)    Physical Exam  Results for orders placed or performed in visit on 05/25/21  Microalbumin, Urine Waived  Result Value Ref Range   Microalb, Ur Waived 10 0 - 19 mg/L   Creatinine, Urine Waived 100  10 - 300 mg/dL   Microalb/Creat Ratio <30 <30 mg/g  Comprehensive metabolic panel  Result Value Ref Range   Glucose 113 (H) 70 - 99 mg/dL   BUN 11 8 - 27 mg/dL   Creatinine, Ser 0.89 0.57 - 1.00 mg/dL   eGFR 74 >59 mL/min/1.73   BUN/Creatinine Ratio 12 12 - 28   Sodium 141 134 - 144 mmol/L   Potassium 3.8 3.5 - 5.2 mmol/L   Chloride 102 96 - 106 mmol/L   CO2 24 20 - 29 mmol/L   Calcium 9.7 8.7 - 10.3 mg/dL   Total Protein 7.0 6.0 - 8.5 g/dL   Albumin 4.9 (H) 3.8 - 4.8  g/dL   Globulin, Total 2.1 1.5 - 4.5 g/dL   Albumin/Globulin Ratio 2.3 (H) 1.2 - 2.2   Bilirubin Total 0.8 0.0 - 1.2 mg/dL   Alkaline Phosphatase 99 44 - 121 IU/L   AST 16 0 - 40 IU/L   ALT 25 0 - 32 IU/L  Urinalysis, Routine w reflex microscopic  Result Value Ref Range   Specific Gravity, UA 1.020 1.005 - 1.030   pH, UA 6.0 5.0 - 7.5   Color, UA Yellow Yellow   Appearance Ur Clear Clear   Leukocytes,UA Negative Negative   Protein,UA Negative Negative/Trace   Glucose, UA Negative Negative   Ketones, UA Negative Negative   RBC, UA Negative Negative   Bilirubin, UA Negative Negative   Urobilinogen, Ur 0.2 0.2 - 1.0 mg/dL   Nitrite, UA Negative Negative  Vitamin B12  Result Value Ref Range   Vitamin B-12 504 232 - 1,245 pg/mL  Lipid panel  Result Value Ref Range   Cholesterol, Total 150 100 - 199 mg/dL   Triglycerides 128 0 - 149 mg/dL   HDL 49 >39 mg/dL   VLDL Cholesterol Cal 23 5 - 40 mg/dL   LDL Chol Calc (NIH) 78 0 - 99 mg/dL   Chol/HDL Ratio 3.1 0.0 - 4.4 ratio  Bayer DCA Hb A1c Waived  Result Value Ref Range   HB A1C (BAYER DCA - WAIVED) 6.3 (H) 4.8 - 5.6 %        Current Outpatient Medications:    atorvastatin (LIPITOR) 40 MG tablet, Take 1 tablet (40 mg total) by mouth daily., Disp: 90 tablet, Rfl: 3   ibuprofen (ADVIL) 600 MG tablet, Take 1 tablet (600 mg total) by mouth every 8 (eight) hours as needed., Disp: 30 tablet, Rfl: 0   losartan (COZAAR) 50 MG tablet, Take 1 tablet (50 mg total) by mouth daily., Disp: 90 tablet, Rfl: 5   metFORMIN (GLUCOPHAGE) 500 MG tablet, Take 1 tablet (500 mg total) by mouth daily., Disp: 30 tablet, Rfl: 3   topiramate (TOPAMAX) 25 MG tablet, Take 1 tablet (25 mg total) by mouth 2 (two) times daily., Disp: 180 tablet, Rfl: 1    Assessment & Plan:  COVID : positive :  Increase fluid intake.  Headahce - tyelnol every 4-6 hrs prn and alternate this with ibubrufen 800 mg q 8 hrly. Sinus pressure: use steam inhalation.  OTC -   Allegra / claritin.  5 days quarantine.  Ok to rtw in 5 days if tests -ve follow  Pt will need to come in wait in the car and get swabbed for flu and COVID  Problem List Items Addressed This Visit   None    No orders of the defined types were placed in this encounter.    No orders of the defined types were  placed in this encounter.    Follow up plan: No follow-ups on file.

## 2021-08-19 ENCOUNTER — Telehealth: Payer: Self-pay | Admitting: Internal Medicine

## 2021-08-19 ENCOUNTER — Encounter: Payer: Self-pay | Admitting: Internal Medicine

## 2021-08-19 LAB — VERITOR FLU A/B WAIVED
Influenza A: NEGATIVE
Influenza B: NEGATIVE

## 2021-08-19 MED ORDER — CHERATUSSIN AC 100-10 MG/5ML PO SOLN: 5.0000 mL | Freq: Every evening | ORAL | 0 refills | Status: DC

## 2021-08-19 NOTE — Telephone Encounter (Signed)
Copied from [place] [phone]. Topic: General - Other >> Aug 19, 2021  4:33 PM Antonieta Iba C wrote: Reason for CRM: [facility] called in for clarification on a medication, guaiFENesin-codeine (CHERATUSSIN AC) 100-10 MG/5ML syrup. Marcie Bal says that they received 2 Rx's and is unsure of which to fill?   Please advise.

## 2021-08-19 NOTE — Telephone Encounter (Signed)
Called pharmacy and informed them that I only had on prescription and it was for 22ml qhs.

## 2021-08-19 NOTE — Telephone Encounter (Signed)
Patient states that the cough syrup is not available at [facility], I tee'd it up to be sent to [facility].

## 2021-08-21 ENCOUNTER — Other Ambulatory Visit: Payer: Self-pay | Admitting: Internal Medicine

## 2021-08-21 LAB — SARS-COV-2, NAA 2 DAY TAT

## 2021-08-21 LAB — NOVEL CORONAVIRUS, NAA: SARS-CoV-2, NAA: NOT DETECTED

## 2021-08-23 NOTE — Progress Notes (Signed)
Please let pt know this was normal.

## 2021-08-23 NOTE — Telephone Encounter (Signed)
Requested medication (s) are due for refill today: yes  Requested medication (s) are on the active medication list: yes  Last refill:  04/20/21 #30 3 refills  Future visit scheduled: no  Notes to clinic:  requesting 90 day supply     Requested Prescriptions  Pending Prescriptions Disp Refills   metFORMIN (GLUCOPHAGE) 500 MG tablet [Pharmacy Med Name: metFORMIN HCl 500 MG Oral Tablet] 90 tablet 0    Sig: Take 1 tablet by mouth once daily     Endocrinology:  Diabetes - Biguanides Passed - 08/21/2021 11:15 AM      Passed - Cr in normal range and within 360 days    Creatinine, Ser  Date Value Ref Range Status  05/25/2021 0.89 0.57 - 1.00 mg/dL Final          Passed - HBA1C is between 0 and 7.9 and within 180 days    HB A1C (BAYER DCA - WAIVED)  Date Value Ref Range Status  05/25/2021 6.3 (H) 4.8 - 5.6 % Final    Comment:             Prediabetes: 5.7 - 6.4          Diabetes: >6.4          Glycemic control for adults with diabetes: <7.0           Passed - eGFR in normal range and within 360 days    GFR calc Af Amer  Date Value Ref Range Status  09/04/2020 75 >59 mL/min/1.73 Final    Comment:    **In accordance with recommendations from the NKF-ASN Task force,**   [facility] is in the process of updating its eGFR calculation to the   2021 CKD-EPI creatinine equation that estimates kidney function   without a race variable.    GFR calc non Af Amer  Date Value Ref Range Status  09/04/2020 65 >59 mL/min/1.73 Final   eGFR  Date Value Ref Range Status  05/25/2021 74 >59 mL/min/1.73 Final          Passed - B12 Level in normal range and within 720 days    Vitamin B-12  Date Value Ref Range Status  05/25/2021 504 232 - 1,245 pg/mL Final          Passed - Valid encounter within last 6 months    Recent Outpatient Visits           5 days ago [facility] V[facility], Avanti, MD   2 weeks ago Acute right-sided low back pain with right-sided sciatica    [facility] Jon Billings, NP   4 weeks ago Diabetes mellitus without complication ([place])   [facility] Vigg, Avanti, MD   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   [facility] Vigg, Avanti, MD   2 months ago COVID-4   [employer] [facility], Avanti, MD              Passed - CBC within normal limits and completed in the last 12 months    WBC  Date Value Ref Range Status  04/06/2021 6.0 3.4 - 10.8 x10E3/uL Final   RBC  Date Value Ref Range Status  04/06/2021 4.74 3.77 - 5.28 x10E6/uL Final   Hemoglobin  Date Value Ref Range Status  04/06/2021 14.1 11.1 - 15.9 g/dL Final   Hematocrit  Date Value Ref Range Status  04/06/2021 42.2 34.0 - 46.6 % Final   MCHC  Date Value Ref Range  Status  04/06/2021 33.4 31.5 - 35.7 g/dL Final   [facility]  Date Value Ref Range Status  04/06/2021 29.7 26.6 - 33.0 pg Final   MCV  Date Value Ref Range Status  04/06/2021 89 79 - 97 fL Final   No results found for: PLTCOUNTKUC, LABPLAT, POCPLA RDW  Date Value Ref Range Status  04/06/2021 11.8 11.7 - 15.4 % Final

## 2021-08-26 ENCOUNTER — Encounter: Payer: Self-pay | Admitting: Internal Medicine

## 2021-08-27 NOTE — Telephone Encounter (Signed)
Pl have her restart slowly with meals and maybe take half of once a day x 1 week then increase to once daily. Thnx.

## 2021-08-30 ENCOUNTER — Encounter: Payer: Self-pay | Admitting: Internal Medicine

## 2021-08-30 DIAGNOSIS — R052 Subacute cough: Secondary | ICD-10-CM | POA: Insufficient documentation

## 2021-09-13 ENCOUNTER — Telehealth: Payer: Self-pay | Admitting: Internal Medicine

## 2021-09-13 NOTE — Telephone Encounter (Signed)
Patient mailed in [employer] FMLA paperwork to be completed by provider and faxed to [phone].  If any questions/concerns, patient can be reached at [phone].  Placed in provider's folder.

## 2021-09-20 ENCOUNTER — Other Ambulatory Visit: Payer: Self-pay

## 2021-09-20 ENCOUNTER — Ambulatory Visit: Payer: BC Managed Care – PPO | Admitting: Internal Medicine

## 2021-09-20 ENCOUNTER — Encounter: Payer: Self-pay | Admitting: Internal Medicine

## 2021-09-20 VITALS — BP 135/87 | HR 80 | Temp 98.4°F | Ht 64.17 in | Wt 201.8 lb

## 2021-09-20 DIAGNOSIS — G9339 Other post infection and related fatigue syndromes: Secondary | ICD-10-CM

## 2021-09-20 DIAGNOSIS — E119 Type 2 diabetes mellitus without complications: Secondary | ICD-10-CM

## 2021-09-20 DIAGNOSIS — K529 Noninfective gastroenteritis and colitis, unspecified: Secondary | ICD-10-CM | POA: Diagnosis not present

## 2021-09-20 MED ORDER — MELATONIN 3 MG PO CAPS: 1.0000 | ORAL_CAPSULE | Freq: Every evening | ORAL | 3 refills | Status: DC

## 2021-09-20 MED ORDER — CITALOPRAM HYDROBROMIDE 10 MG PO TABS: 10.0000 mg | ORAL_TABLET | Freq: Every day | ORAL | 3 refills | Status: DC

## 2021-09-20 NOTE — Progress Notes (Signed)
? ?BP 135/87   Pulse 80   Temp 98.4 ?F (36.9 ?C) (Oral)   Ht 5' 4.17" (1.63 m)   Wt 201 lb 12.8 oz (91.5 kg)   LMP 05/09/2014 (Approximate)   SpO2 98%   BMI 34.45 kg/m?   ? ?Subjective:  ? ? Patient ID: Kendra Espinoza, female    DOB: 05-15-1961, 61 y.o.   MRN: [id number] ? ?Chief Complaint  ?Patient presents with  ?? Insomnia  ?  Fatigue, patient states that she was off of her metformin for a few days, then when she started taking it again she started to have insomnia, and fatigue, and falling  ? ? ?HPI: ?Kendra Espinoza is a 61 y.o. female ? ?Has had diarrhea - is now on 250 mg has had diarrhea every few days. Last c-scope 2-3 yrs ago.to have 5 yrs. ?Husbnad went to take care of his dad , sleep affected.  ?He left for 2 weeks she says she didn't leave the house then. Never been alone for 18 yrs. ?Had COVID in nov and then January 2023.  ?Feels fatigued.  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  ?Mila Merry- Thursday. ? ?Insomnia ?Primary symptoms: no fragmented sleep, no sleep disturbance, no difficulty falling asleep, no somnolence, no frequent awakening, no premature morning awakening, no malaise/fatigue, no napping.   ?The symptoms are aggravated by work stress.  ? ?Chief Complaint  ?Patient presents with  ?? Insomnia  ?  Fatigue, patient states that she was off of her metformin for a few days, then when she started taking it again she started to have insomnia, and fatigue, and falling  ? ? ?Relevant past medical, surgical, family and social history reviewed and updated as indicated. Interim medical history since our last visit reviewed. ?Allergies and medications reviewed and updated. ? ?Review of Systems  ?Constitutional:  Negative for malaise/fatigue.  ?Psychiatric/Behavioral:  Negative for sleep disturbance. The patient has insomnia.   ? ?Per HPI unless specifically indicated above ? ?   ?Objective:  ?  ?BP 135/87   Pulse 80   Temp 98.4 ?F (36.9  ?C) (Oral)   Ht 5' 4.17" (1.63 m)   Wt 201 lb 12.8 oz (91.5 kg)   LMP 05/09/2014 (Approximate)   SpO2 98%   BMI 34.45 kg/m?   ?Wt Readings from Last 3 Encounters:  ?09/20/21 201 lb 12.8 oz (91.5 kg)  ?08/06/21 201 lb 12.8 oz (91.5 kg)  ?06/14/21 204 lb 6.4 oz (92.7 kg)  ?  ?Physical Exam ?Vitals and nursing note reviewed.  ?Constitutional:   ?   General: She is not in acute distress. ?   Appearance: Normal appearance. She is not ill-appearing or diaphoretic.  ?Eyes:  ?   Conjunctiva/sclera: Conjunctivae normal.  ?Pulmonary:  ?   Breath sounds: No rhonchi.  ?Abdominal:  ?   General: Abdomen is flat. Bowel sounds are normal. There is no distension.  ?   Palpations: Abdomen is soft. There is no mass.  ?   Tenderness: There is no abdominal tenderness. There is no guarding.  ?Skin: ?   General: Skin is warm and dry.  ?   Coloration: Skin is not jaundiced.  ?   Findings: No erythema.  ?Neurological:  ?   Mental Status: She is alert.  ? ? ?Results for orders placed or performed in visit on 08/18/21  ?Novel Coronavirus, NAA ([facility])  ? Specimen: Nasopharyngeal(NP) swabs in vial transport  medium  ?Result Value Ref Range  ? SARS-CoV-2, NAA Not Detected Not Detected  ?SARS-COV-2, NAA 2 DAY TAT  ?Result Value Ref Range  ? SARS-CoV-2, NAA 2 DAY TAT Performed   ?Veritor Flu A/B Waived  ?Result Value Ref Range  ? Influenza A Negative Negative  ? Influenza B Negative Negative  ? ?   ? ? ?Current Outpatient Medications:  ??  atorvastatin (LIPITOR) 40 MG tablet, Take 1 tablet (40 mg total) by mouth daily., Disp: 90 tablet, Rfl: 3 ??  citalopram (CELEXA) 10 MG tablet, Take 1 tablet (10 mg total) by mouth daily., Disp: 30 tablet, Rfl: 3 ??  ibuprofen (ADVIL) 600 MG tablet, Take 1 tablet (600 mg total) by mouth every 8 (eight) hours as needed., Disp: 30 tablet, Rfl: 0 ??  losartan (COZAAR) 50 MG tablet, Take 1 tablet (50 mg total) by mouth daily., Disp: 90 tablet, Rfl: 5 ??  Melatonin 3 MG CAPS, Take 1 capsule (3 mg total) by  mouth every evening., Disp: 30 capsule, Rfl: 3 ??  topiramate (TOPAMAX) 25 MG tablet, Take 1 tablet (25 mg total) by mouth 2 (two) times daily., Disp: 180 tablet, Rfl: 1  ? ? ?Assessment & Plan:  ?Anxiety  ?Will start pt on celexa  ?Anxiety / depression : ?will start celexa 10 mg daily , potential Side effects dw pt. to call office if develops any SE. will fu with me in 1 month for such. pt verbalised understanding. ? ?  Diarrhea sec to to metformin check labs prior to next visit. ?? Sec to Stomach bug. ? ?Fatigue ? Post COVID.  ? ?Insomnia will start pt on melatonin for such  ? ?Problem List Items Addressed This Visit   ? ?  ? Digestive  ? Chronic diarrhea  ?  ? Endocrine  ? Diabetes mellitus without complication ([place]) - Primary  ? Relevant Orders  ? Bayer DCA Hb A1c Waived (STAT)  ? Comprehensive metabolic panel  ? ?Other Visit Diagnoses   ? ? Other post infection and related fatigue syndromes      ? ?  ?  ? ?Orders Placed This Encounter  ?Procedures  ?? Bayer DCA Hb A1c Waived (STAT)  ?? Comprehensive metabolic panel  ?  ? ?Meds ordered this encounter  ?Medications  ?? Melatonin 3 MG CAPS  ?  Sig: Take 1 capsule (3 mg total) by mouth every evening.  ?  Dispense:  30 capsule  ?  Refill:  3  ?? citalopram (CELEXA) 10 MG tablet  ?  Sig: Take 1 tablet (10 mg total) by mouth daily.  ?  Dispense:  30 tablet  ?  Refill:  3  ?  ? ?Follow up plan: ?No follow-ups on file. ? ? ?

## 2021-09-27 ENCOUNTER — Other Ambulatory Visit: Payer: Self-pay

## 2021-09-27 ENCOUNTER — Other Ambulatory Visit: Payer: BC Managed Care – PPO

## 2021-09-27 DIAGNOSIS — E119 Type 2 diabetes mellitus without complications: Secondary | ICD-10-CM

## 2021-09-27 DIAGNOSIS — G9339 Other post infection and related fatigue syndromes: Secondary | ICD-10-CM

## 2021-09-27 DIAGNOSIS — E7800 Pure hypercholesterolemia, unspecified: Secondary | ICD-10-CM

## 2021-09-27 DIAGNOSIS — R7309 Other abnormal glucose: Secondary | ICD-10-CM

## 2021-09-27 LAB — URINALYSIS, ROUTINE W REFLEX MICROSCOPIC
Bilirubin, UA: NEGATIVE
Glucose, UA: NEGATIVE
Ketones, UA: NEGATIVE
Leukocytes,UA: NEGATIVE
Nitrite, UA: NEGATIVE
Protein,UA: NEGATIVE
RBC, UA: NEGATIVE
Specific Gravity, UA: 1.015 (ref 1.005–1.030)
Urobilinogen, Ur: 0.2 mg/dL (ref 0.2–1.0)
pH, UA: 7 (ref 5.0–7.5)

## 2021-09-28 LAB — CBC WITH DIFFERENTIAL/PLATELET
Basophils Absolute: 0 10*3/uL (ref 0.0–0.2)
Basos: 1 %
EOS (ABSOLUTE): 0.2 10*3/uL (ref 0.0–0.4)
Eos: 3 %
Hematocrit: 41.6 % (ref 34.0–46.6)
Hemoglobin: 14.2 g/dL (ref 11.1–15.9)
Immature Grans (Abs): 0 10*3/uL (ref 0.0–0.1)
Immature Granulocytes: 0 %
Lymphocytes Absolute: 2.2 10*3/uL (ref 0.7–3.1)
Lymphs: 35 %
MCH: 29.6 pg (ref 26.6–33.0)
MCHC: 34.1 g/dL (ref 31.5–35.7)
MCV: 87 fL (ref 79–97)
Monocytes Absolute: 0.4 10*3/uL (ref 0.1–0.9)
Monocytes: 6 %
Neutrophils Absolute: 3.4 10*3/uL (ref 1.4–7.0)
Neutrophils: 55 %
Platelets: 225 10*3/uL (ref 150–450)
RBC: 4.8 x10E6/uL (ref 3.77–5.28)
RDW: 12.1 % (ref 11.7–15.4)
WBC: 6.2 10*3/uL (ref 3.4–10.8)

## 2021-09-28 LAB — LIPID PANEL W/O CHOL/HDL RATIO
Cholesterol, Total: 253 mg/dL — ABNORMAL HIGH (ref 100–199)
HDL: 51 mg/dL (ref >39)
LDL Chol Calc (NIH): 174 mg/dL — ABNORMAL HIGH (ref 0–99)
Triglycerides: 152 mg/dL — ABNORMAL HIGH (ref 0–149)
VLDL Cholesterol Cal: 28 mg/dL (ref 5–40)

## 2021-09-28 LAB — COMPREHENSIVE METABOLIC PANEL
ALT: 25 IU/L (ref 0–32)
AST: 17 IU/L (ref 0–40)
Albumin/Globulin Ratio: 2.3 — ABNORMAL HIGH (ref 1.2–2.2)
Albumin: 4.3 g/dL (ref 3.8–4.8)
Alkaline Phosphatase: 109 IU/L (ref 44–121)
BUN/Creatinine Ratio: 15 (ref 12–28)
BUN: 12 mg/dL (ref 8–27)
Bilirubin Total: 0.5 mg/dL (ref 0.0–1.2)
CO2: 19 mmol/L — ABNORMAL LOW (ref 20–29)
Calcium: 9.5 mg/dL (ref 8.7–10.3)
Chloride: 105 mmol/L (ref 96–106)
Creatinine, Ser: 0.81 mg/dL (ref 0.57–1.00)
Globulin, Total: 1.9 g/dL (ref 1.5–4.5)
Glucose: 148 mg/dL — ABNORMAL HIGH (ref 70–99)
Potassium: 4.2 mmol/L (ref 3.5–5.2)
Sodium: 138 mmol/L (ref 134–144)
Total Protein: 6.2 g/dL (ref 6.0–8.5)
eGFR: 82 mL/min/{1.73_m2} (ref >59)

## 2021-09-28 LAB — HEMOGLOBIN A1C
Est. average glucose Bld gHb Est-mCnc: 137 mg/dL
Hgb A1c MFr Bld: 6.4 % — ABNORMAL HIGH (ref 4.8–5.6)

## 2021-09-28 LAB — TSH: TSH: 2.56 u[IU]/mL (ref 0.450–4.500)

## 2021-09-28 LAB — VITAMIN B12: Vitamin B-12: 372 pg/mL (ref 232–1245)

## 2021-10-04 ENCOUNTER — Telehealth (INDEPENDENT_AMBULATORY_CARE_PROVIDER_SITE_OTHER): Payer: BC Managed Care – PPO | Admitting: Internal Medicine

## 2021-10-04 ENCOUNTER — Encounter: Payer: Self-pay | Admitting: Internal Medicine

## 2021-10-04 DIAGNOSIS — K529 Noninfective gastroenteritis and colitis, unspecified: Secondary | ICD-10-CM

## 2021-10-04 DIAGNOSIS — F339 Major depressive disorder, recurrent, unspecified: Secondary | ICD-10-CM | POA: Diagnosis not present

## 2021-10-04 MED ORDER — CITALOPRAM HYDROBROMIDE 20 MG PO TABS: 20.0000 mg | ORAL_TABLET | Freq: Every day | ORAL | 2 refills | Status: DC

## 2021-10-04 NOTE — Progress Notes (Signed)
? ?LMP 05/09/2014 (Approximate)   ? ?Subjective:  ? ? Patient ID: Kendra Espinoza, female    DOB: July 11, 1961, 61 y.o.   MRN: [id number] ? ?Chief Complaint  ?Patient presents with  ? Anxiety  ?  Patient is here to follow up on Anxiety. Patient denies having any concerns at today's visit.   ? ? ?HPI: ?Kendra Espinoza is a 61 y.o. female ? ? ?This visit was completed via video visit through MyChart due to the restrictions of the COVID-19 pandemic. All issues as above were discussed and addressed. Physical exam was done as above through visual confirmation on video through MyChart. If it was felt that the patient should be evaluated in the office, they were directed there. The patient verbally consented to this visit. ?Location of the patient: home ?Location of the provider: work ?Those involved with this call:  ?Provider: Charlynne Cousins, MD ?CMA: Frazier Butt, CMA ?Front Desk/Registration: [employer]  ?Time spent on call: 10 minutes with patient face to face via video conference. More than 50% of this time was spent in counseling and coordination of care. 10 minutes total spent in review of patient's record and preparation of their chart. ? ? ? ?Anxiety ?Presents for follow-up visit. Symptoms include irritability and nervous/anxious behavior. Patient reports no chest pain, compulsions, decreased concentration, depressed mood, feeling of choking, malaise, muscle tension, nausea, obsessions, palpitations, panic, restlessness, shortness of breath or suicidal ideas.  ? ? ? ?Chief Complaint  ?Patient presents with  ? Anxiety  ?  Patient is here to follow up on Anxiety. Patient denies having any concerns at today's visit.   ? ? ?Relevant past medical, surgical, family and social history reviewed and updated as indicated. Interim medical history since our last visit reviewed. ?Allergies and medications reviewed and updated. ? ?Review of Systems  ?Constitutional:  Positive for irritability.  ?Respiratory:  Negative for shortness  of breath.   ?Cardiovascular:  Negative for chest pain and palpitations.  ?Gastrointestinal:  Negative for nausea.  ?Psychiatric/Behavioral:  Negative for decreased concentration and suicidal ideas. The patient is nervous/anxious.   ? ?Per HPI unless specifically indicated above ? ?   ?Objective:  ?  ?LMP 05/09/2014 (Approximate)   ?Wt Readings from Last 3 Encounters:  ?09/20/21 201 lb 12.8 oz (91.5 kg)  ?08/06/21 201 lb 12.8 oz (91.5 kg)  ?06/14/21 204 lb 6.4 oz (92.7 kg)  ?  ?Physical Exam ? ?Results for orders placed or performed in visit on 09/27/21  ?B12  ?Result Value Ref Range  ? Vitamin B-12 372 232 - 1,245 pg/mL  ?TSH  ?Result Value Ref Range  ? TSH 2.560 0.450 - 4.500 uIU/mL  ?Urinalysis, Routine w reflex microscopic  ?Result Value Ref Range  ? Specific Gravity, UA 1.015 1.005 - 1.030  ? pH, UA 7.0 5.0 - 7.5  ? Color, UA Yellow Yellow  ? Appearance Ur Clear Clear  ? Leukocytes,UA Negative Negative  ? Protein,UA Negative Negative/Trace  ? Glucose, UA Negative Negative  ? Ketones, UA Negative Negative  ? RBC, UA Negative Negative  ? Bilirubin, UA Negative Negative  ? Urobilinogen, Ur 0.2 0.2 - 1.0 mg/dL  ? Nitrite, UA Negative Negative  ?Lipid Panel w/o Chol/HDL Ratio  ?Result Value Ref Range  ? Cholesterol, Total 253 (H) 100 - 199 mg/dL  ? Triglycerides 152 (H) 0 - 149 mg/dL  ? HDL 51 >39 mg/dL  ? VLDL Cholesterol Cal 28 5 - 40 mg/dL  ? LDL Chol Calc (NIH) 174 (H)  0 - 99 mg/dL  ?HgB A1c  ?Result Value Ref Range  ? Hgb A1c MFr Bld 6.4 (H) 4.8 - 5.6 %  ? Est. average glucose Bld gHb Est-mCnc 137 mg/dL  ?CBC with Differential/Platelet  ?Result Value Ref Range  ? WBC 6.2 3.4 - 10.8 x10E3/uL  ? RBC 4.80 3.77 - 5.28 x10E6/uL  ? Hemoglobin 14.2 11.1 - 15.9 g/dL  ? Hematocrit 41.6 34.0 - 46.6 %  ? MCV 87 79 - 97 fL  ? MCH 29.6 26.6 - 33.0 pg  ? MCHC 34.1 31.5 - 35.7 g/dL  ? RDW 12.1 11.7 - 15.4 %  ? Platelets 225 150 - 450 x10E3/uL  ? Neutrophils 55 Not Estab. %  ? Lymphs 35 Not Estab. %  ? Monocytes 6 Not Estab. %   ? Eos 3 Not Estab. %  ? Basos 1 Not Estab. %  ? Neutrophils Absolute 3.4 1.4 - 7.0 x10E3/uL  ? Lymphocytes Absolute 2.2 0.7 - 3.1 x10E3/uL  ? Monocytes Absolute 0.4 0.1 - 0.9 x10E3/uL  ? EOS (ABSOLUTE) 0.2 0.0 - 0.4 x10E3/uL  ? Basophils Absolute 0.0 0.0 - 0.2 x10E3/uL  ? Immature Granulocytes 0 Not Estab. %  ? Immature Grans (Abs) 0.0 0.0 - 0.1 x10E3/uL  ?Comprehensive metabolic panel  ?Result Value Ref Range  ? Glucose 148 (H) 70 - 99 mg/dL  ? BUN 12 8 - 27 mg/dL  ? Creatinine, Ser 0.81 0.57 - 1.00 mg/dL  ? eGFR 82 >59 mL/min/1.73  ? BUN/Creatinine Ratio 15 12 - 28  ? Sodium 138 134 - 144 mmol/L  ? Potassium 4.2 3.5 - 5.2 mmol/L  ? Chloride 105 96 - 106 mmol/L  ? CO2 19 (L) 20 - 29 mmol/L  ? Calcium 9.5 8.7 - 10.3 mg/dL  ? Total Protein 6.2 6.0 - 8.5 g/dL  ? Albumin 4.3 3.8 - 4.8 g/dL  ? Globulin, Total 1.9 1.5 - 4.5 g/dL  ? Albumin/Globulin Ratio 2.3 (H) 1.2 - 2.2  ? Bilirubin Total 0.5 0.0 - 1.2 mg/dL  ? Alkaline Phosphatase 109 44 - 121 IU/L  ? AST 17 0 - 40 IU/L  ? ALT 25 0 - 32 IU/L  ? ?   ? ? ?Current Outpatient Medications:  ?  atorvastatin (LIPITOR) 40 MG tablet, Take 1 tablet (40 mg total) by mouth daily., Disp: 90 tablet, Rfl: 3 ?  ibuprofen (ADVIL) 600 MG tablet, Take 1 tablet (600 mg total) by mouth every 8 (eight) hours as needed., Disp: 30 tablet, Rfl: 0 ?  losartan (COZAAR) 50 MG tablet, Take 1 tablet (50 mg total) by mouth daily., Disp: 90 tablet, Rfl: 5 ?  topiramate (TOPAMAX) 25 MG tablet, Take 1 tablet (25 mg total) by mouth 2 (two) times daily., Disp: 180 tablet, Rfl: 1 ?  citalopram (CELEXA) 20 MG tablet, Take 1 tablet (20 mg total) by mouth daily., Disp: 30 tablet, Rfl: 2  ? ? ?Assessment & Plan:  ?Anxiety is on celexa 10 mg  ?Increase to 20 mg daiy, consider increasing furhte rto 40 mg  ? potential Side effects dw pt. to call office if develops any SE. will fu with me in 1 month for such. pt verbalised understanding. ?Check labs  ?  ?Chronic diarrhea stable. Was deemed  sec to to metformin.  Stable. Improved.  ? ? ?Problem List Items Addressed This Visit   ? ?  ? Digestive  ? Chronic diarrhea  ? Relevant Orders  ? CBC with Differential/Platelet  ? Comprehensive metabolic panel  ?  Lipid panel  ? Urinalysis, Routine w reflex microscopic  ? TSH  ? Bayer DCA Hb A1c Waived (STAT)  ?  ? Other  ? Depression, recurrent ([place]) - Primary  ? Relevant Medications  ? citalopram (CELEXA) 20 MG tablet  ? Other Relevant Orders  ? CBC with Differential/Platelet  ? Comprehensive metabolic panel  ? Lipid panel  ? Urinalysis, Routine w reflex microscopic  ? TSH  ? Bayer DCA Hb A1c Waived (STAT)  ?  ? ?Orders Placed This Encounter  ?Procedures  ? CBC with Differential/Platelet  ? Comprehensive metabolic panel  ? Lipid panel  ? Urinalysis, Routine w reflex microscopic  ? TSH  ? Bayer DCA Hb A1c Waived (STAT)  ?  ? ?Meds ordered this encounter  ?Medications  ? citalopram (CELEXA) 20 MG tablet  ?  Sig: Take 1 tablet (20 mg total) by mouth daily.  ?  Dispense:  30 tablet  ?  Refill:  2  ?  ? ?Follow up plan: ?Return in about 6 weeks (around 11/15/2021). ? ? ?

## 2021-10-18 ENCOUNTER — Other Ambulatory Visit: Payer: Self-pay

## 2021-10-18 DIAGNOSIS — G43909 Migraine, unspecified, not intractable, without status migrainosus: Secondary | ICD-10-CM

## 2021-10-19 MED ORDER — TOPIRAMATE 25 MG PO TABS: 25.0000 mg | ORAL_TABLET | Freq: Two times a day (BID) | ORAL | 1 refills | Status: DC

## 2021-10-27 ENCOUNTER — Encounter: Payer: Self-pay | Admitting: Internal Medicine

## 2021-11-15 ENCOUNTER — Ambulatory Visit: Payer: BC Managed Care – PPO | Admitting: Internal Medicine

## 2021-11-16 ENCOUNTER — Ambulatory Visit: Payer: BC Managed Care – PPO | Admitting: Internal Medicine

## 2021-11-19 ENCOUNTER — Ambulatory Visit: Payer: BC Managed Care – PPO | Admitting: Internal Medicine

## 2021-12-24 IMAGING — CR DG CHEST 2V
2 series · 2 of 2 positions shown · non-contrast
Comparison: None

CLINICAL DATA: Cough for 1 week.  Nonsmoker.

EXAM:
CHEST - 2 VIEW

[chest pa]
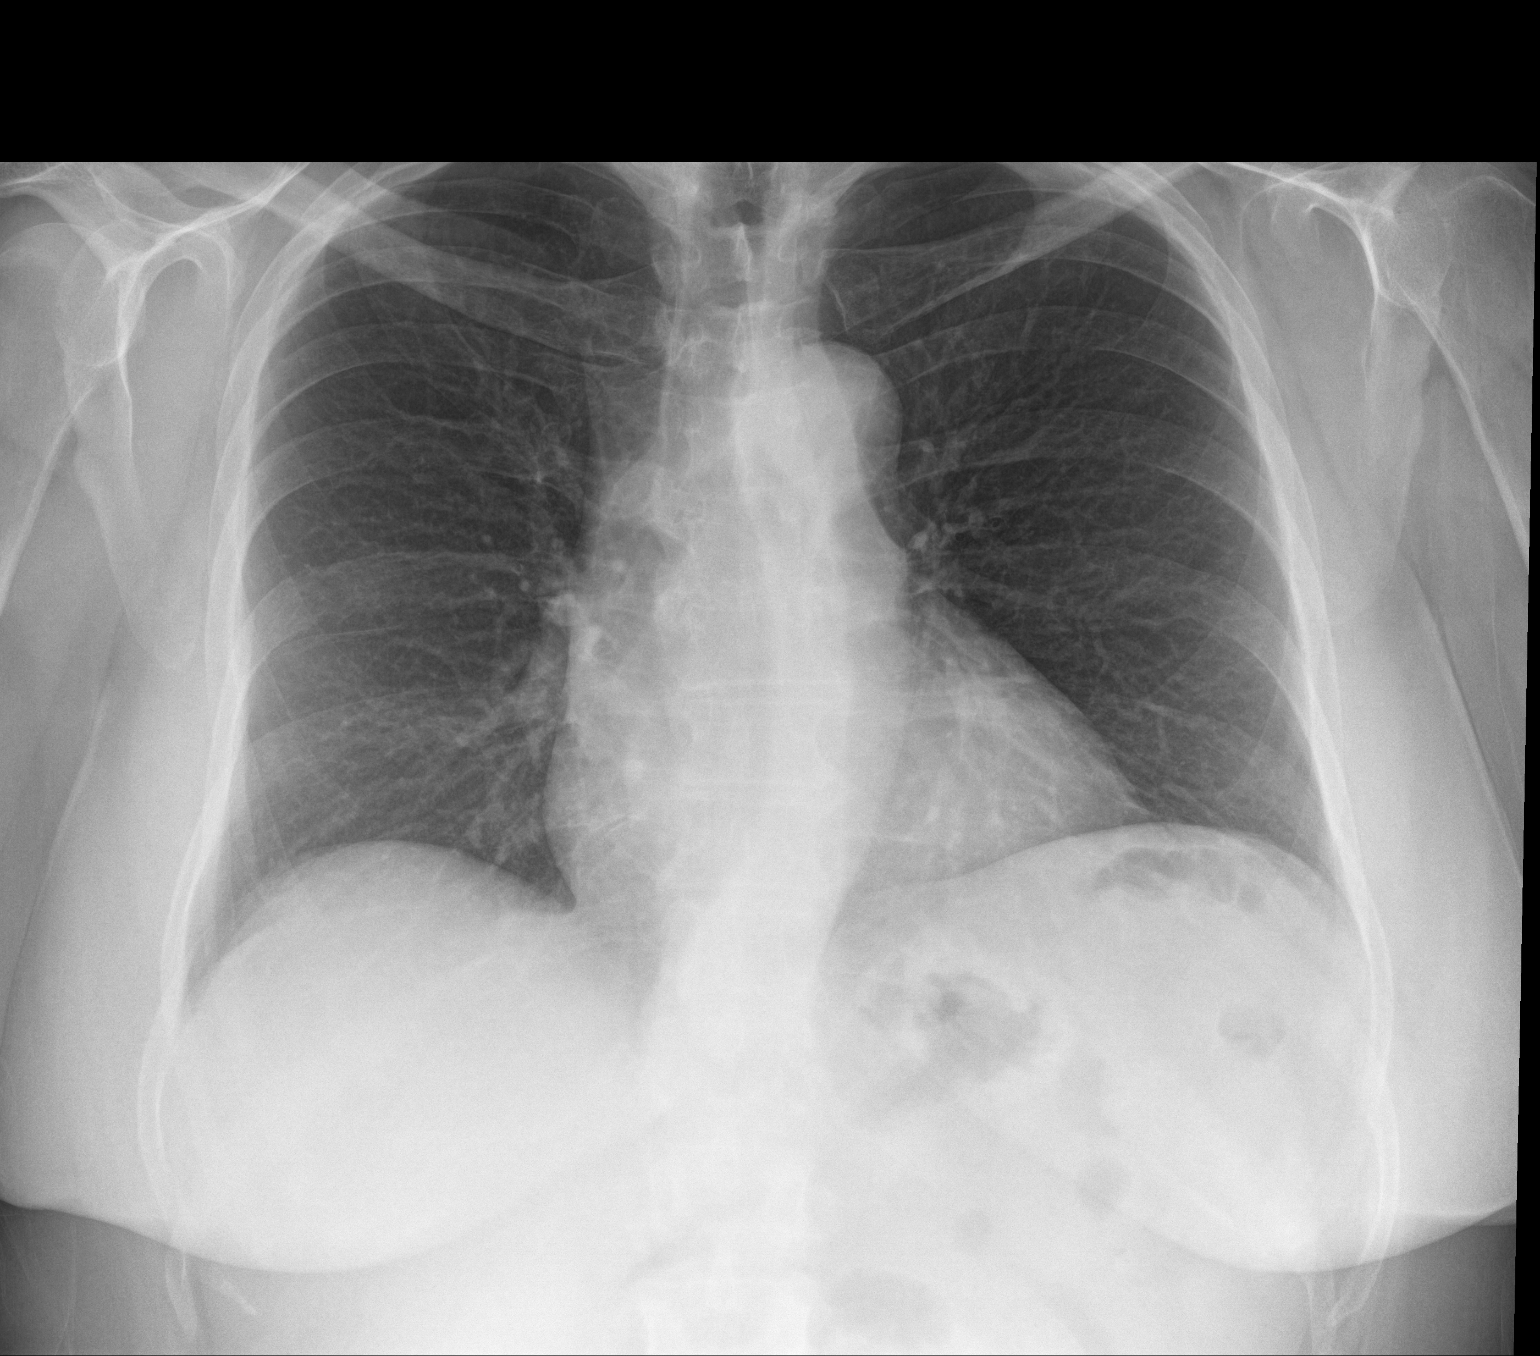

[chest lat]
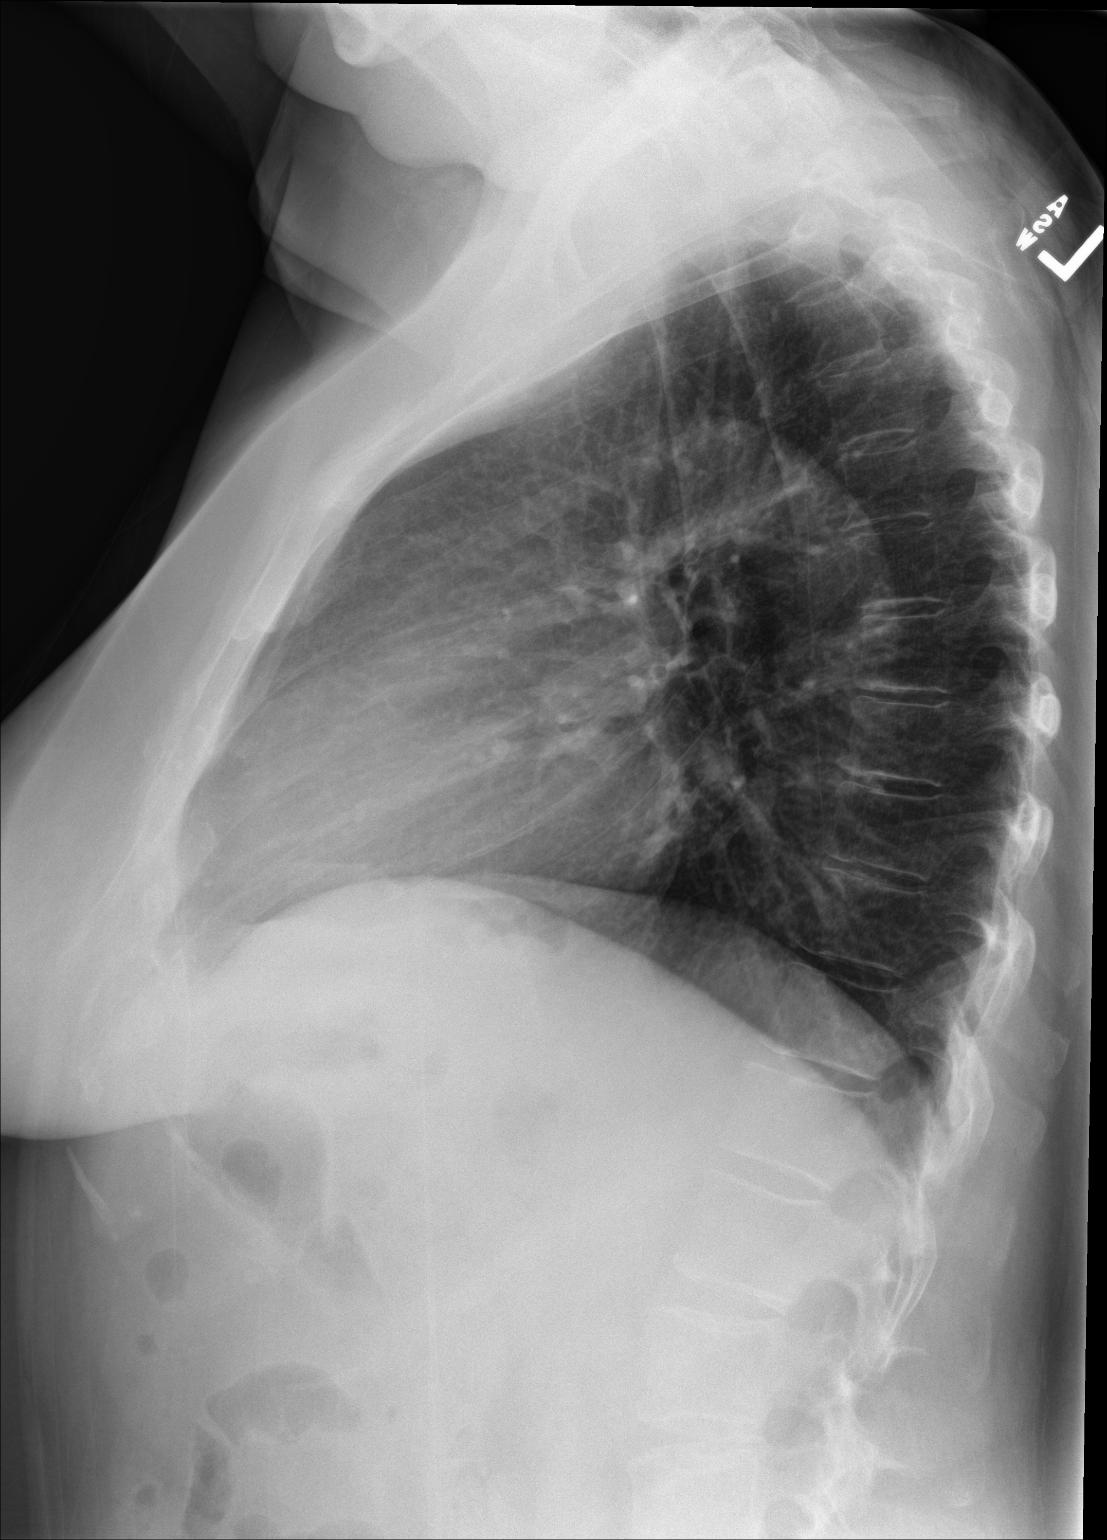

[2 of 2 positions shown; findings below may reference images not displayed]

FINDINGS: Trachea midline. Cardiomediastinal contours with suggestion of
aortic ectasia. Hilar structures are normal. Aortic tortuosity, mild
also suggested.

Lungs are clear. No sign of effusion. On limited assessment no acute
skeletal process.
IMPRESSION: 1. No active cardiopulmonary disease.
2. Question mild ascending aortic ectasia.

## 2022-03-24 ENCOUNTER — Other Ambulatory Visit: Payer: Self-pay

## 2022-03-24 DIAGNOSIS — Z1231 Encounter for screening mammogram for malignant neoplasm of breast: Secondary | ICD-10-CM

## 2022-04-14 ENCOUNTER — Inpatient Hospital Stay: Admission: RE | Admit: 2022-04-14 | Payer: BC Managed Care – PPO | Source: Ambulatory Visit

## 2022-04-24 ENCOUNTER — Encounter: Payer: Self-pay | Admitting: Nurse Practitioner

## 2022-04-24 IMAGING — CR DG CHEST 2V
2 series · 2 of 2 positions shown · non-contrast
Comparison: 04/29/2020

CLINICAL DATA: Cough with concern for pneumonia.

EXAM:
CHEST - 2 VIEW

[chest pa]
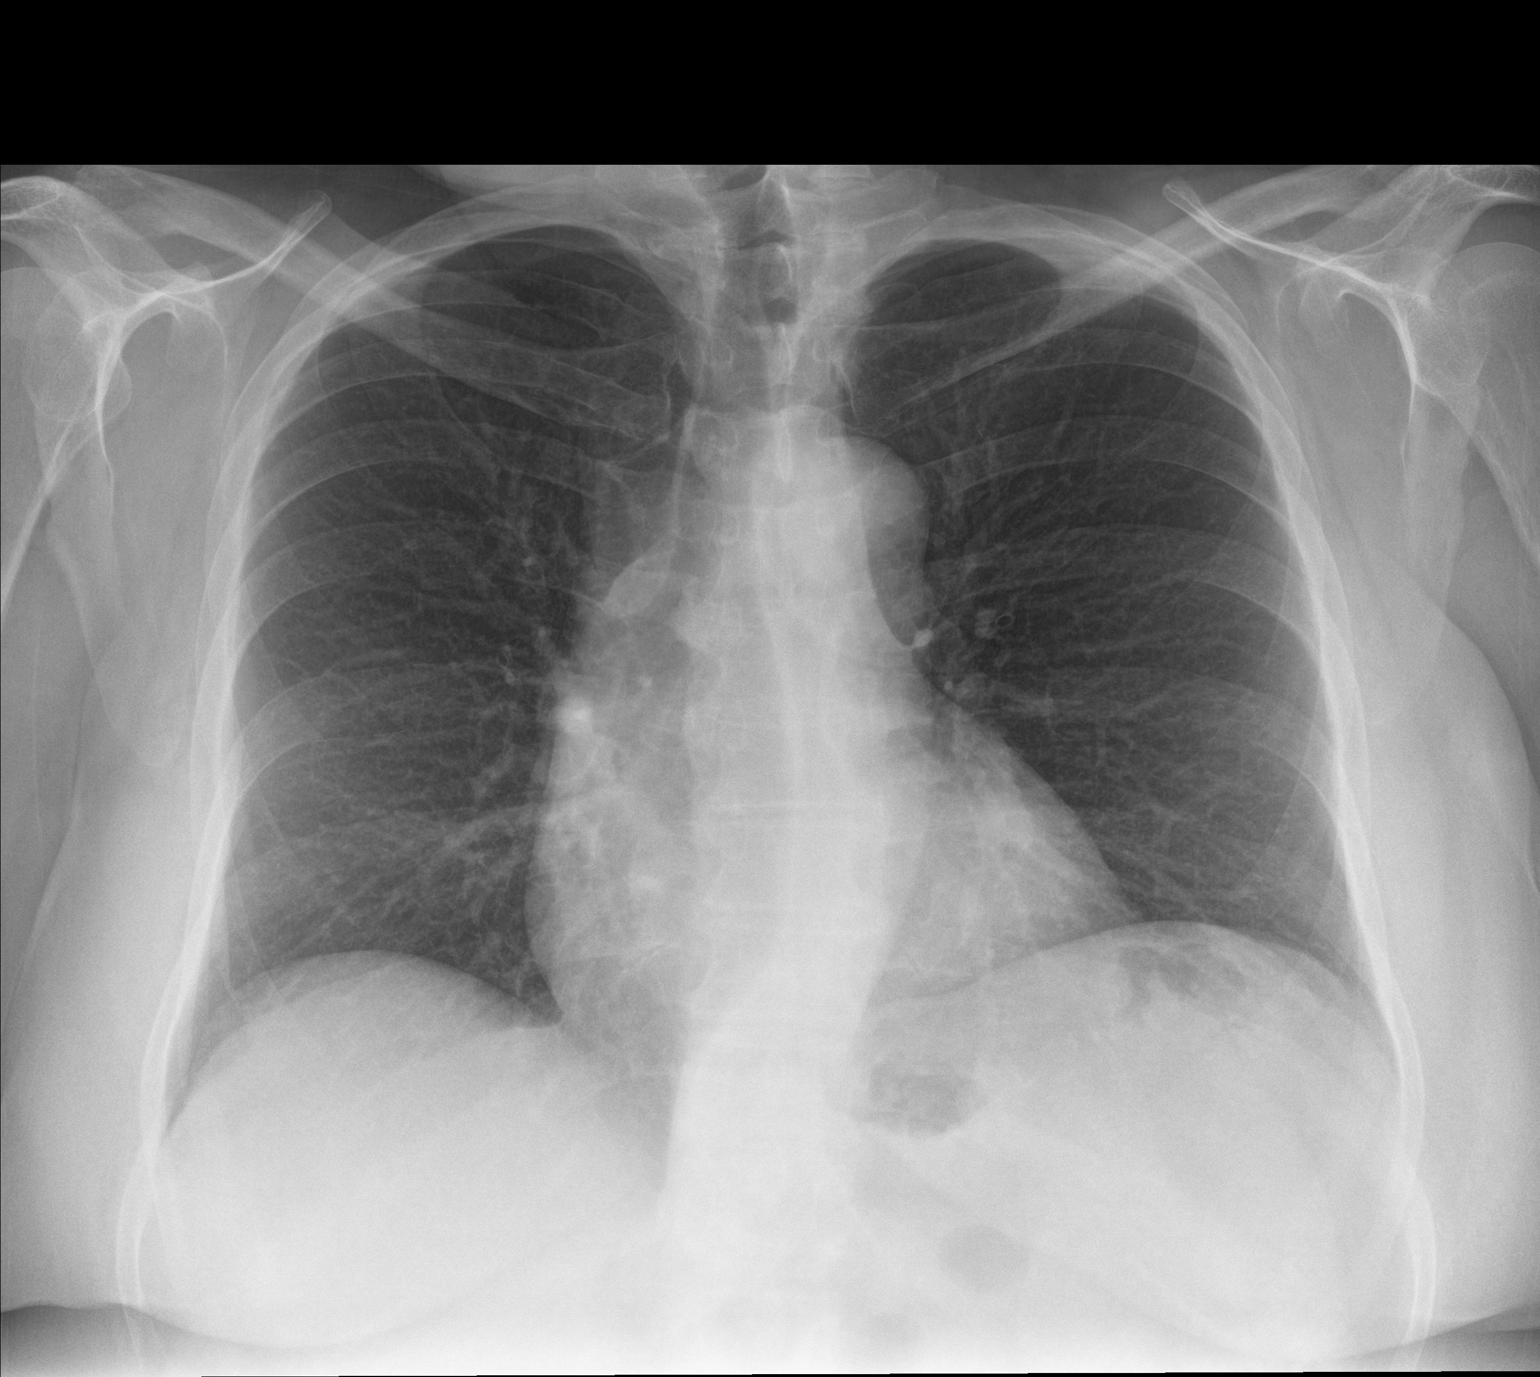

[chest lat]
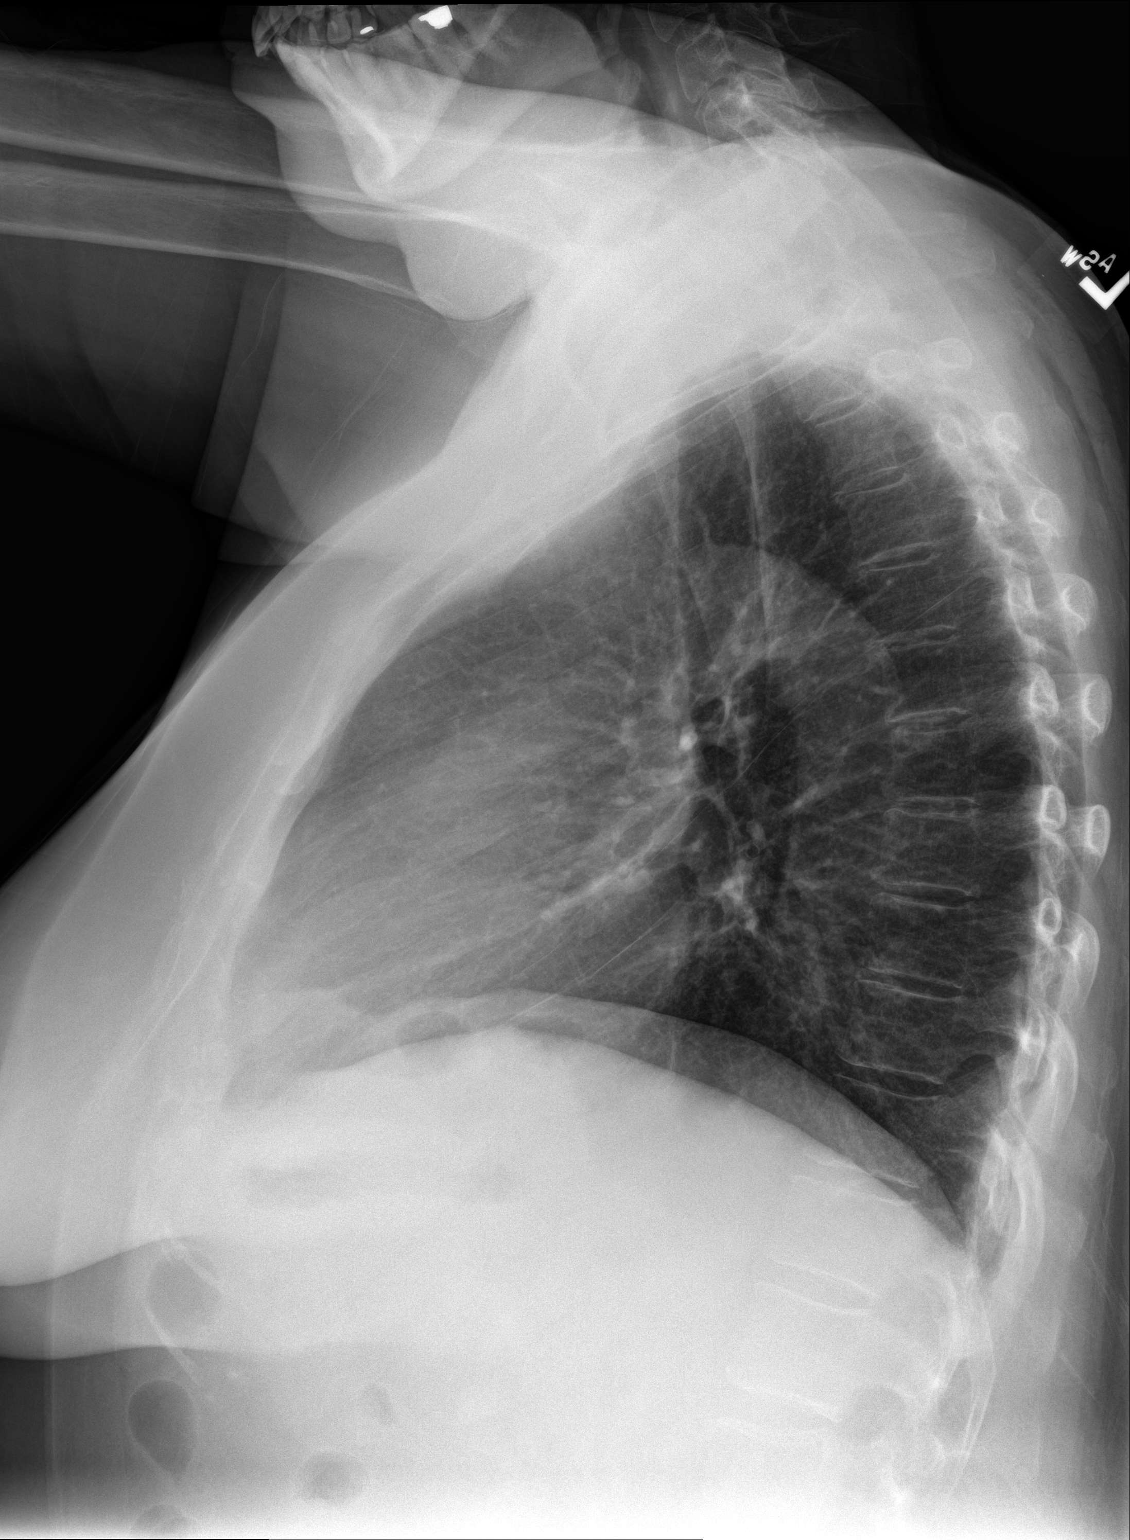

[2 of 2 positions shown; findings below may reference images not displayed]

FINDINGS: The heart size and mediastinal contours are within normal limits.
Both lungs are clear. The visualized skeletal structures are
unremarkable.
IMPRESSION: No active cardiopulmonary disease.

## 2022-05-11 ENCOUNTER — Ambulatory Visit: Payer: Self-pay | Admitting: Nurse Practitioner

## 2022-05-19 ENCOUNTER — Encounter: Payer: Self-pay | Admitting: Nurse Practitioner

## 2022-05-19 ENCOUNTER — Ambulatory Visit (INDEPENDENT_AMBULATORY_CARE_PROVIDER_SITE_OTHER): Payer: Self-pay | Admitting: Nurse Practitioner

## 2022-05-19 VITALS — BP 128/74 | HR 74 | Temp 97.8°F | Ht 64.57 in | Wt 200.8 lb

## 2022-05-19 DIAGNOSIS — E785 Hyperlipidemia, unspecified: Secondary | ICD-10-CM

## 2022-05-19 DIAGNOSIS — I10 Essential (primary) hypertension: Secondary | ICD-10-CM

## 2022-05-19 DIAGNOSIS — Z23 Encounter for immunization: Secondary | ICD-10-CM

## 2022-05-19 DIAGNOSIS — E118 Type 2 diabetes mellitus with unspecified complications: Secondary | ICD-10-CM

## 2022-05-19 DIAGNOSIS — F339 Major depressive disorder, recurrent, unspecified: Secondary | ICD-10-CM

## 2022-05-19 DIAGNOSIS — G43909 Migraine, unspecified, not intractable, without status migrainosus: Secondary | ICD-10-CM

## 2022-05-19 MED ORDER — TOPIRAMATE 50 MG PO TABS: 50.0000 mg | ORAL_TABLET | Freq: Two times a day (BID) | ORAL | 1 refills | Status: DC

## 2022-05-19 MED ORDER — ATORVASTATIN CALCIUM 40 MG PO TABS: 40.0000 mg | ORAL_TABLET | Freq: Every day | ORAL | 1 refills | Status: DC

## 2022-05-19 MED ORDER — LOSARTAN POTASSIUM 50 MG PO TABS: 50.0000 mg | ORAL_TABLET | Freq: Every day | ORAL | 1 refills | Status: DC

## 2022-05-19 NOTE — Progress Notes (Deleted)
LMP 05/09/2014 (Approximate)    Subjective:    Patient ID: Kendra Espinoza, female    DOB: 1961-02-21, 61 y.o.   MRN: [id number]  HPI: Kendra Espinoza is a 61 y.o. female presenting on 05/19/2022 for comprehensive medical examination. Current medical complaints include:{Blank single:19197::"none","***"}  She currently lives with: Menopausal Symptoms: {Blank single:19197::"yes","no"}  HYPERTENSION / [place] Satisfied with current treatment? {Blank single:19197::"yes","no"} Duration of hypertension: {Blank single:19197::"chronic","months","years"} BP monitoring frequency: {Blank single:19197::"not checking","rarely","daily","weekly","monthly","a few times a day","a few times a week","a few times a month"} BP range:  BP medication side effects: {Blank single:19197::"yes","no"} Past BP meds: {Blank BJYNWGNF:62130::"QMVH","QIONGEXBMW","UXLKGMWNUU/VOZDGUYQIH","KVQQVZDG","LOVFIEPPIR","JJOACZYSAY/TKZS","WFUXNATFTD (bystolic)","carvedilol","chlorthalidone","clonidine","diltiazem","exforge HCT","HCTZ","irbesartan (avapro)","labetalol","lisinopril","lisinopril-HCTZ","losartan (cozaar)","methyldopa","nifedipine","olmesartan (benicar)","olmesartan-HCTZ","quinapril","ramipril","spironalactone","tekturna","valsartan","valsartan-HCTZ","verapamil"} Duration of hyperlipidemia: {Blank single:19197::"chronic","months","years"} Cholesterol medication side effects: {Blank single:19197::"yes","no"} Cholesterol supplements: {Blank multiple:19196::"none","fish oil","niacin","red yeast rice"} Past cholesterol medications: {Blank multiple:19196::"none","atorvastain (lipitor)","lovastatin (mevacor)","pravastatin (pravachol)","rosuvastatin (crestor)","simvastatin (zocor)","vytorin","fenofibrate (tricor)","gemfibrozil","ezetimide (zetia)","niaspan","lovaza"} Medication compliance: {Blank single:19197::"excellent compliance","good compliance","fair compliance","poor compliance"} Aspirin: {Blank  single:19197::"yes","no"} Recent stressors: {Blank single:19197::"yes","no"} Recurrent headaches: {Blank single:19197::"yes","no"} Visual changes: {Blank single:19197::"yes","no"} Palpitations: {Blank single:19197::"yes","no"} Dyspnea: {Blank single:19197::"yes","no"} Chest pain: {Blank single:19197::"yes","no"} Lower extremity edema: {Blank single:19197::"yes","no"} Dizzy/lightheaded: {Blank single:19197::"yes","no"}  DIABETES Hypoglycemic episodes:{Blank single:19197::"yes","no"} Polydipsia/polyuria: {Blank single:19197::"yes","no"} Visual disturbance: {Blank single:19197::"yes","no"} Chest pain: {Blank single:19197::"yes","no"} Paresthesias: {Blank single:19197::"yes","no"} Glucose Monitoring: {Blank single:19197::"yes","no"}  Accucheck frequency: {Blank single:19197::"Not Checking","Daily","BID","TID"}  Fasting glucose:  Post prandial:  Evening:  Before meals: Taking Insulin?: {Blank single:19197::"yes","no"}  Long acting insulin:  Short acting insulin: Blood Pressure Monitoring: {Blank single:19197::"not checking","rarely","daily","weekly","monthly","a few times a day","a few times a week","a few times a month"} Retinal Examination: {Blank single:19197::"Up to Date","Not up to Date"} Foot Exam: {Blank single:19197::"Up to Date","Not up to Date"} Diabetic Education: {Blank single:19197::"Completed","Not Completed"} Pneumovax: {Blank single:19197::"Up to Date","Not up to Date","unknown"} Influenza: {Blank single:19197::"Up to Date","Not up to Date","unknown"} Aspirin: {Blank single:19197::"yes","no"}  MOOD  Depression Screen done today and results listed below:     10/04/2021    9:33 AM 09/20/2021   10:00 AM 08/18/2021    3:39 PM 08/06/2021    2:53 PM 06/14/2021    9:50 AM  Depression screen PHQ 2/9  Decreased Interest _0 Down, Depressed, Hopeless _1 PHQ - 2 Score _2 Altered sleeping _3 Tired, decreased energy _4 Change in appetite  _5 Feeling bad or failure about yourself  2 1 0 0 1  Trouble concentrating _6 Moving slowly or fidgety/restless _7 0 2  Suicidal thoughts 0 0 0 0 0  PHQ-9 Score _8 Difficult doing work/chores Very difficult Extremely dIfficult  Somewhat difficult Very difficult    The patient {has/does not DUKG:25427} a history of falls. I {did/did not:19850} complete a risk assessment for falls. A plan of care for falls {was/was not:19852} documented.   Past Medical History:  Past Medical History:  Diagnosis Date   Cancer ([place])    skin   Migraines    about 1x/month since starting meds    Surgical History:  Past Surgical History:  Procedure Laterality Date   CESAREAN SECTION     COLONOSCOPY WITH PROPOFOL N/A 06/11/2019   Procedure: COLONOSCOPY WITH BIOPSIES;  Surgeon: Virgel Manifold, MD;  Location: [facility];  Service: Endoscopy;  Laterality: N/A;   ESOPHAGOGASTRODUODENOSCOPY (EGD) WITH PROPOFOL N/A 11/22/2019   Procedure: ESOPHAGOGASTRODUODENOSCOPY (EGD) WITH PROPOFOL;  Surgeon: Virgel Manifold, MD;  Location: [facility];  Service: Endoscopy;  Laterality: N/A;  POLYPECTOMY N/A 06/11/2019   Procedure: POLYPECTOMY;  Surgeon: Virgel Manifold, MD;  Location: [facility];  Service: Endoscopy;  Laterality: N/A;   skin cancer removal     TONSILLECTOMY      Medications:  Current Outpatient Medications on File Prior to Visit  Medication Sig   atorvastatin (LIPITOR) 40 MG tablet Take 1 tablet (40 mg total) by mouth daily.   citalopram (CELEXA) 20 MG tablet Take 1 tablet (20 mg total) by mouth daily.   ibuprofen (ADVIL) 600 MG tablet Take 1 tablet (600 mg total) by mouth every 8 (eight) hours as needed.   losartan (COZAAR) 50 MG tablet Take 1 tablet (50 mg total) by mouth daily.   topiramate (TOPAMAX) 25 MG tablet Take 1 tablet (25 mg total) by mouth 2 (two) times daily.   [DISCONTINUED] albuterol (VENTOLIN HFA) 108 (90 Base)  MCG/ACT inhaler Inhale 2 puffs into the lungs every 6 (six) hours as needed for wheezing or shortness of breath.   No current facility-administered medications on file prior to visit.    Allergies:  Allergies  Allergen Reactions   Codeine     Dizziness    Social History:  Social History   Socioeconomic History   Marital status: Married    Spouse name: Not on file   Number of children: Not on file   Years of education: Not on file   Highest education level: Not on file  Occupational History   Not on file  Tobacco Use   Smoking status: Never   Smokeless tobacco: Never  Vaping Use   Vaping Use: Never used  Substance and Sexual Activity   Alcohol use: Not Currently    Comment: occasionally   Drug use: No   Sexual activity: Yes    Birth control/protection: None    Comment: Married  Other Topics Concern   Not on file  Social History Narrative   Not on file   Social Determinants of Health   Financial Resource Strain: Not on file  Food Insecurity: Not on file  Transportation Needs: Not on file  Physical Activity: Not on file  Stress: Not on file  Social Connections: Not on file  Intimate Partner Violence: Not on file   Social History   Tobacco Use  Smoking Status Never  Smokeless Tobacco Never   Social History   Substance and Sexual Activity  Alcohol Use Not Currently   Comment: occasionally    Family History:  Family History  Problem Relation Age of Onset   Heart disease Mother    Hypertension Mother    Stroke Mother    Heart attack Father 25   Heart disease Father    Hypertension Father    Drug abuse Sister    Hodgkin's lymphoma Brother    Heart attack Maternal Grandmother    Heart attack Maternal Grandfather    Heart attack Paternal Grandfather     Past medical history, surgical history, medications, allergies, family history and social history reviewed with patient today and changes made to appropriate areas of the chart.   ROS All other  ROS negative except what is listed above and in the HPI.      Objective:    LMP 05/09/2014 (Approximate)   Wt Readings from Last 3 Encounters:  09/20/21 201 lb 12.8 oz (91.5 kg)  08/06/21 201 lb 12.8 oz (91.5 kg)  06/14/21 204 lb 6.4 oz (92.7 kg)    Physical Exam  Results for orders placed or performed in visit  on 09/27/21  B12  Result Value Ref Range   Vitamin B-12 372 232 - 1,245 pg/mL  TSH  Result Value Ref Range   TSH 2.560 0.450 - 4.500 uIU/mL  Urinalysis, Routine w reflex microscopic  Result Value Ref Range   Specific Gravity, UA 1.015 1.005 - 1.030   pH, UA 7.0 5.0 - 7.5   Color, UA Yellow Yellow   Appearance Ur Clear Clear   Leukocytes,UA Negative Negative   Protein,UA Negative Negative/Trace   Glucose, UA Negative Negative   Ketones, UA Negative Negative   RBC, UA Negative Negative   Bilirubin, UA Negative Negative   Urobilinogen, Ur 0.2 0.2 - 1.0 mg/dL   Nitrite, UA Negative Negative  Lipid Panel w/o Chol/HDL Ratio  Result Value Ref Range   Cholesterol, Total 253 (H) 100 - 199 mg/dL   Triglycerides 152 (H) 0 - 149 mg/dL   HDL 51 >39 mg/dL   VLDL Cholesterol Cal 28 5 - 40 mg/dL   LDL Chol Calc (NIH) 174 (H) 0 - 99 mg/dL  HgB A1c  Result Value Ref Range   Hgb A1c MFr Bld 6.4 (H) 4.8 - 5.6 %   Est. average glucose Bld gHb Est-mCnc 137 mg/dL  CBC with Differential/Platelet  Result Value Ref Range   WBC 6.2 3.4 - 10.8 x10E3/uL   RBC 4.80 3.77 - 5.28 x10E6/uL   Hemoglobin 14.2 11.1 - 15.9 g/dL   Hematocrit 41.6 34.0 - 46.6 %   MCV 87 79 - 97 fL   MCH 29.6 26.6 - 33.0 pg   MCHC 34.1 31.5 - 35.7 g/dL   RDW 12.1 11.7 - 15.4 %   Platelets 225 150 - 450 x10E3/uL   Neutrophils 55 Not Estab. %   Lymphs 35 Not Estab. %   Monocytes 6 Not Estab. %   Eos 3 Not Estab. %   Basos 1 Not Estab. %   Neutrophils Absolute 3.4 1.4 - 7.0 x10E3/uL   Lymphocytes Absolute 2.2 0.7 - 3.1 x10E3/uL   Monocytes Absolute 0.4 0.1 - 0.9 x10E3/uL   EOS (ABSOLUTE) 0.2 0.0 - 0.4  x10E3/uL   Basophils Absolute 0.0 0.0 - 0.2 x10E3/uL   Immature Granulocytes 0 Not Estab. %   Immature Grans (Abs) 0.0 0.0 - 0.1 x10E3/uL  Comprehensive metabolic panel  Result Value Ref Range   Glucose 148 (H) 70 - 99 mg/dL   BUN 12 8 - 27 mg/dL   Creatinine, Ser 0.81 0.57 - 1.00 mg/dL   eGFR 82 >59 mL/min/1.73   BUN/Creatinine Ratio 15 12 - 28   Sodium 138 134 - 144 mmol/L   Potassium 4.2 3.5 - 5.2 mmol/L   Chloride 105 96 - 106 mmol/L   CO2 19 (L) 20 - 29 mmol/L   Calcium 9.5 8.7 - 10.3 mg/dL   Total Protein 6.2 6.0 - 8.5 g/dL   Albumin 4.3 3.8 - 4.8 g/dL   Globulin, Total 1.9 1.5 - 4.5 g/dL   Albumin/Globulin Ratio 2.3 (H) 1.2 - 2.2   Bilirubin Total 0.5 0.0 - 1.2 mg/dL   Alkaline Phosphatase 109 44 - 121 IU/L   AST 17 0 - 40 IU/L   ALT 25 0 - 32 IU/L      Assessment & Plan:   Problem List Items Addressed This Visit       Cardiovascular and Mediastinum   Primary hypertension - Primary     Other   Vitamin D deficiency     Follow up plan: No follow-ups on  file.   LABORATORY TESTING:  - Pap smear: {Blank VGVSYV:48628::"OOJ done","not applicable","up to date","done elsewhere"}  IMMUNIZATIONS:   - Tdap: Tetanus vaccination status reviewed: {tetanus status:315746}. - Influenza: {Blank single:19197::"Up to date","Administered today","Postponed to flu season","Refused","Given elsewhere"} - Pneumovax: {Blank single:19197::"Up to date","Administered today","Not applicable","Refused","Given elsewhere"} - Prevnar: {Blank single:19197::"Up to date","Administered today","Not applicable","Refused","Given elsewhere"} - COVID: {Blank single:19197::"Up to date","Administered today","Not applicable","Refused","Given elsewhere"} - HPV: {Blank single:19197::"Up to date","Administered today","Not applicable","Refused","Given elsewhere"} - Shingrix vaccine: {Blank single:19197::"Up to date","Administered today","Not applicable","Refused","Given elsewhere"}  SCREENING: -Mammogram:  {Blank single:19197::"Up to date","Ordered today","Not applicable","Refused","Done elsewhere"}  - Colonoscopy: {Blank single:19197::"Up to date","Ordered today","Not applicable","Refused","Done elsewhere"}  - Bone Density: {Blank single:19197::"Up to date","Ordered today","Not applicable","Refused","Done elsewhere"}  -Hearing Test: {Blank single:19197::"Up to date","Ordered today","Not applicable","Refused","Done elsewhere"}  -Spirometry: {Blank single:19197::"Up to date","Ordered today","Not applicable","Refused","Done elsewhere"}   PATIENT COUNSELING:   Advised to take 1 mg of folate supplement per day if capable of pregnancy.   Sexuality: Discussed sexually transmitted diseases, partner selection, use of condoms, avoidance of unintended pregnancy  and contraceptive alternatives.   Advised to avoid cigarette smoking.  I discussed with the patient that most people either abstain from alcohol or drink within safe limits (<=14/week and <=4 drinks/occasion for males, <=7/weeks and <= 3 drinks/occasion for females) and that the risk for alcohol disorders and other health effects rises proportionally with the number of drinks per week and how often a drinker exceeds daily limits.  Discussed cessation/primary prevention of drug use and availability of treatment for abuse.   Diet: Encouraged to adjust caloric intake to maintain  or achieve ideal body weight, to reduce intake of dietary saturated fat and total fat, to limit sodium intake by avoiding high sodium foods and not adding table salt, and to maintain adequate dietary potassium and calcium preferably from fresh fruits, vegetables, and low-fat dairy products.    stressed the importance of regular exercise  Injury prevention: Discussed safety belts, safety helmets, smoke detector, smoking near bedding or upholstery.   Dental health: Discussed importance of regular tooth brushing, flossing, and dental visits.    NEXT PREVENTATIVE PHYSICAL DUE  IN 1 YEAR. No follow-ups on file.

## 2022-05-19 NOTE — Assessment & Plan Note (Signed)
Chronic.  Controlled without medication..  Labs ordered today.  Return to clinic in 6 months for reevaluation.  Call sooner if concerns arise.  ° °

## 2022-05-19 NOTE — Assessment & Plan Note (Signed)
Chronic.  Controlled.  Agreed to restart Atorvastatin '40mg'$  daily.  Explained ASCVD risk score with patient as well as cardioprotective benefits with having diabetes.  Labs ordered today.  Return to clinic in 6 months for reevaluation.  Call sooner if concerns arise.

## 2022-05-19 NOTE — Assessment & Plan Note (Signed)
Chronic.  Controlled.  Continue with current medication regimen on Losartan '50mg'$ .  Refills sent today.  Labs ordered today.  Return to clinic in 6 months for reevaluation.  Call sooner if concerns arise.

## 2022-05-19 NOTE — Assessment & Plan Note (Signed)
Chronic. Not well controlled. Will increase Topamax to '50mg'$  BID. Follow up in 6 months.  Call sooner if concerns arise.

## 2022-05-19 NOTE — Assessment & Plan Note (Signed)
Chronic.  Controlled without medication.  Foot exam done today.  Will do microalbumin at next visit when patient has insurance.  Labs ordered today.  Return to clinic in 6 months for reevaluation.  Call sooner if concerns arise.

## 2022-05-19 NOTE — Progress Notes (Signed)
BP 128/74   Pulse 74   Temp 97.8 F (36.6 C) (Oral)   Ht 5' 4.57" (1.64 m)   Wt 200 lb 12.8 oz (91.1 kg)   LMP 05/09/2014 (Approximate)   SpO2 97%   BMI 33.86 kg/m    Subjective:    Patient ID: Kendra Espinoza, female    DOB: 1960-09-12, 61 y.o.   MRN: [id number]  HPI: Kendra Espinoza is a 61 y.o. female  Chief Complaint  Patient presents with   Hypertension   Hyperlipidemia   Diabetes   Annual Exam   HYPERTENSION / [place] Satisfied with current treatment? yes Duration of hypertension: years BP monitoring frequency: daily BP range: 120/74 BP medication side effects: no Past BP meds: losartan (cozaar) Duration of hyperlipidemia: years Cholesterol medication side effects: no Cholesterol supplements: none Past cholesterol medications: none Medication compliance: excellent compliance Aspirin: no Recent stressors: no Recurrent headaches: yes Visual changes: no Palpitations: no Dyspnea: no Chest pain: no Lower extremity edema: no Dizzy/lightheaded: no  The 10-year ASCVD risk score (Arnett DK, et al., 2019) is: 12.5%   Values used to calculate the score:     Age: 89 years     Sex: Female     Is Non-Hispanic African American: No     Diabetic: Yes     Tobacco smoker: No     Systolic Blood Pressure: 591 mmHg     Is BP treated: Yes     HDL Cholesterol: 51 mg/dL     Total Cholesterol: 253 mg/dL   DIABETES Hypoglycemic episodes:no Polydipsia/polyuria: no Visual disturbance: no Chest pain: no Paresthesias: no Glucose Monitoring: no  Accucheck frequency: Not Checking  Fasting glucose:  Post prandial:  Evening:  Before meals: Taking Insulin?: no  Long acting insulin:  Short acting insulin: Blood Pressure Monitoring: daily Retinal Examination: Not up to Date Foot Exam: Up to Date Diabetic Education: Not Completed Pneumovax: Up to Date Influenza: Up to Date Aspirin: no  MOOD Patient states she is doing well.  Sometimes she feels a little  lonely. Doesn't eel like she needs any medication.  Denies concerns at visit today.   [facility] Visit from 05/19/2022 in [facility]  PHQ-9 Total Score 9         05/19/2022    1:55 PM 10/04/2021    9:34 AM 09/20/2021   10:00 AM 08/18/2021    3:41 PM  GAD 7 : Generalized Anxiety Score  Nervous, Anxious, on Edge _0 Control/stop worrying _1 Worry too much - different things _2 Trouble relaxing _3 Restless _4 0  Easily annoyed or irritable _5 0  Afraid - awful might happen _6 0  Total GAD 7 Score _7 Anxiety Difficulty Somewhat difficult Very difficult Extremely difficult Somewhat difficult      HEADACHES Patient states she has been getting more headaches recently.  States that the topamax was working well for her.  Feels like it needs to be increased.   Relevant past medical, surgical, family and social history reviewed and updated as indicated. Interim medical history since our last visit reviewed. Allergies and medications reviewed and updated.  Review of Systems  Eyes:  Negative for visual disturbance.  Respiratory:  Negative for cough, chest tightness and shortness of breath.   Cardiovascular:  Negative for chest pain, palpitations and leg swelling.  Neurological:  Positive for headaches. Negative for dizziness.  Psychiatric/Behavioral:  Positive for decreased concentration. Negative for suicidal ideas. The patient is not nervous/anxious.     Per HPI unless specifically indicated above     Objective:    BP 128/74   Pulse 74   Temp 97.8 F (36.6 C) (Oral)   Ht 5' 4.57" (1.64 m)   Wt 200 lb 12.8 oz (91.1 kg)   LMP 05/09/2014 (Approximate)   SpO2 97%   BMI 33.86 kg/m   Wt Readings from Last 3 Encounters:  05/19/22 200 lb 12.8 oz (91.1 kg)  09/20/21 201 lb 12.8 oz (91.5 kg)  08/06/21 201 lb 12.8 oz (91.5 kg)    Physical Exam Vitals and nursing note reviewed.  Constitutional:      General: She is not  in acute distress.    Appearance: Normal appearance. She is obese. She is not ill-appearing, toxic-appearing or diaphoretic.  HENT:     Head: Normocephalic.     Right Ear: External ear normal.     Left Ear: External ear normal.     Nose: Nose normal.     Mouth/Throat:     Mouth: Mucous membranes are moist.     Pharynx: Oropharynx is clear.  Eyes:     General:        Right eye: No discharge.        Left eye: No discharge.     Extraocular Movements: Extraocular movements intact.     Conjunctiva/sclera: Conjunctivae normal.     Pupils: Pupils are equal, round, and reactive to light.  Cardiovascular:     Rate and Rhythm: Normal rate and regular rhythm.     Heart sounds: No murmur heard. Pulmonary:     Effort: Pulmonary effort is normal. No respiratory distress.     Breath sounds: Normal breath sounds. No wheezing or rales.  Musculoskeletal:     Cervical back: Normal range of motion and neck supple.  Skin:    General: Skin is warm and dry.     Capillary Refill: Capillary refill takes less than 2 seconds.  Neurological:     General: No focal deficit present.     Mental Status: She is alert and oriented to person, place, and time. Mental status is at baseline.  Psychiatric:        Mood and Affect: Mood normal.        Behavior: Behavior normal.        Thought Content: Thought content normal.        Judgment: Judgment normal.     Results for orders placed or performed in visit on 09/27/21  B12  Result Value Ref Range   Vitamin B-12 372 232 - 1,245 pg/mL  TSH  Result Value Ref Range   TSH 2.560 0.450 - 4.500 uIU/mL  Urinalysis, Routine w reflex microscopic  Result Value Ref Range   Specific Gravity, UA 1.015 1.005 - 1.030   pH, UA 7.0 5.0 - 7.5   Color, UA Yellow Yellow   Appearance Ur Clear Clear   Leukocytes,UA Negative Negative   Protein,UA Negative Negative/Trace   Glucose, UA Negative Negative   Ketones, UA Negative Negative   RBC, UA Negative Negative   Bilirubin,  UA Negative Negative   Urobilinogen, Ur 0.2 0.2 - 1.0 mg/dL   Nitrite, UA Negative Negative  Lipid Panel w/o Chol/HDL Ratio  Result Value Ref Range   Cholesterol, Total 253 (H) 100 - 199 mg/dL   Triglycerides 152 (H) 0 - 149  mg/dL   HDL 51 >39 mg/dL   VLDL Cholesterol Cal 28 5 - 40 mg/dL   LDL Chol Calc (NIH) 174 (H) 0 - 99 mg/dL  HgB A1c  Result Value Ref Range   Hgb A1c MFr Bld 6.4 (H) 4.8 - 5.6 %   Est. average glucose Bld gHb Est-mCnc 137 mg/dL  CBC with Differential/Platelet  Result Value Ref Range   WBC 6.2 3.4 - 10.8 x10E3/uL   RBC 4.80 3.77 - 5.28 x10E6/uL   Hemoglobin 14.2 11.1 - 15.9 g/dL   Hematocrit 41.6 34.0 - 46.6 %   MCV 87 79 - 97 fL   MCH 29.6 26.6 - 33.0 pg   MCHC 34.1 31.5 - 35.7 g/dL   RDW 12.1 11.7 - 15.4 %   Platelets 225 150 - 450 x10E3/uL   Neutrophils 55 Not Estab. %   Lymphs 35 Not Estab. %   Monocytes 6 Not Estab. %   Eos 3 Not Estab. %   Basos 1 Not Estab. %   Neutrophils Absolute 3.4 1.4 - 7.0 x10E3/uL   Lymphocytes Absolute 2.2 0.7 - 3.1 x10E3/uL   Monocytes Absolute 0.4 0.1 - 0.9 x10E3/uL   EOS (ABSOLUTE) 0.2 0.0 - 0.4 x10E3/uL   Basophils Absolute 0.0 0.0 - 0.2 x10E3/uL   Immature Granulocytes 0 Not Estab. %   Immature Grans (Abs) 0.0 0.0 - 0.1 x10E3/uL  Comprehensive metabolic panel  Result Value Ref Range   Glucose 148 (H) 70 - 99 mg/dL   BUN 12 8 - 27 mg/dL   Creatinine, Ser 0.81 0.57 - 1.00 mg/dL   eGFR 82 >59 mL/min/1.73   BUN/Creatinine Ratio 15 12 - 28   Sodium 138 134 - 144 mmol/L   Potassium 4.2 3.5 - 5.2 mmol/L   Chloride 105 96 - 106 mmol/L   CO2 19 (L) 20 - 29 mmol/L   Calcium 9.5 8.7 - 10.3 mg/dL   Total Protein 6.2 6.0 - 8.5 g/dL   Albumin 4.3 3.8 - 4.8 g/dL   Globulin, Total 1.9 1.5 - 4.5 g/dL   Albumin/Globulin Ratio 2.3 (H) 1.2 - 2.2   Bilirubin Total 0.5 0.0 - 1.2 mg/dL   Alkaline Phosphatase 109 44 - 121 IU/L   AST 17 0 - 40 IU/L   ALT 25 0 - 32 IU/L      Assessment & Plan:   Problem List Items Addressed  This Visit       Cardiovascular and Mediastinum   Migraine    Chronic. Not well controlled. Will increase Topamax to 96m BID. Follow up in 6 months.  Call sooner if concerns arise.       Relevant Medications   atorvastatin (LIPITOR) 40 MG tablet   losartan (COZAAR) 50 MG tablet   topiramate (TOPAMAX) 50 MG tablet   Primary hypertension    Chronic.  Controlled.  Continue with current medication regimen on Losartan 54m  Refills sent today.  Labs ordered today.  Return to clinic in 6 months for reevaluation.  Call sooner if concerns arise.        Relevant Medications   atorvastatin (LIPITOR) 40 MG tablet   losartan (COZAAR) 50 MG tablet     Endocrine   Type 2 diabetes with complication (HCC) - Primary    Chronic.  Controlled without medication.  Foot exam done today.  Will do microalbumin at next visit when patient has insurance.  Labs ordered today.  Return to clinic in 6 months for reevaluation.  Call sooner if  concerns arise.        Relevant Medications   atorvastatin (LIPITOR) 40 MG tablet   losartan (COZAAR) 50 MG tablet   Other Relevant Orders   HgB A1c     Other   Depression, recurrent (HCC)    Chronic.  Controlled without medication. Labs ordered today.  Return to clinic in 6 months for reevaluation.  Call sooner if concerns arise.        Hyperlipidemia    Chronic.  Controlled.  Agreed to restart Atorvastatin 55m daily.  Explained ASCVD risk score with patient as well as cardioprotective benefits with having diabetes.  Labs ordered today.  Return to clinic in 6 months for reevaluation.  Call sooner if concerns arise.        Relevant Medications   atorvastatin (LIPITOR) 40 MG tablet   losartan (COZAAR) 50 MG tablet   Other Relevant Orders   Lipid panel   Comp Met (CMET)   Other Visit Diagnoses     Need for influenza vaccination       Relevant Orders   Flu Vaccine QUAD 6+ mos PF IM (Fluarix Quad PF) (Completed)        Follow up plan: Return in about  6 months (around 11/17/2022) for Physical and Fasting labs.

## 2022-05-20 LAB — COMPREHENSIVE METABOLIC PANEL WITH GFR
ALT: 28 IU/L (ref 0–32)
AST: 20 IU/L (ref 0–40)
Albumin/Globulin Ratio: 1.9 (ref 1.2–2.2)
Albumin: 4.4 g/dL (ref 3.9–4.9)
Alkaline Phosphatase: 99 IU/L (ref 44–121)
BUN/Creatinine Ratio: 11 — ABNORMAL LOW (ref 12–28)
BUN: 10 mg/dL (ref 8–27)
Bilirubin Total: 0.6 mg/dL (ref 0.0–1.2)
CO2: 19 mmol/L — ABNORMAL LOW (ref 20–29)
Calcium: 9.3 mg/dL (ref 8.7–10.3)
Chloride: 103 mmol/L (ref 96–106)
Creatinine, Ser: 0.91 mg/dL (ref 0.57–1.00)
Globulin, Total: 2.3 g/dL (ref 1.5–4.5)
Glucose: 166 mg/dL — ABNORMAL HIGH (ref 70–99)
Potassium: 3.9 mmol/L (ref 3.5–5.2)
Sodium: 138 mmol/L (ref 134–144)
Total Protein: 6.7 g/dL (ref 6.0–8.5)
eGFR: 71 mL/min/1.73

## 2022-05-20 LAB — LIPID PANEL
Chol/HDL Ratio: 4.8 ratio — ABNORMAL HIGH (ref 0.0–4.4)
Cholesterol, Total: 255 mg/dL — ABNORMAL HIGH (ref 100–199)
HDL: 53 mg/dL (ref >39)
LDL Chol Calc (NIH): 168 mg/dL — ABNORMAL HIGH (ref 0–99)
Triglycerides: 186 mg/dL — ABNORMAL HIGH (ref 0–149)
VLDL Cholesterol Cal: 34 mg/dL (ref 5–40)

## 2022-05-20 LAB — HEMOGLOBIN A1C
Est. average glucose Bld gHb Est-mCnc: 146 mg/dL
Hgb A1c MFr Bld: 6.7 % — ABNORMAL HIGH (ref 4.8–5.6)

## 2022-05-20 NOTE — Progress Notes (Signed)
Please let patient know that her lab work came back showing that she is diabetic.  I recommend we follow up in January when she has insurance instead of 6 months.  Please move her appt up.  She does not need any medication at this time.  Her cholesterol is elevated and I recommend she continue with the atorvastatin.

## 2022-07-20 ENCOUNTER — Ambulatory Visit: Payer: Self-pay | Admitting: Nurse Practitioner

## 2022-08-15 NOTE — Progress Notes (Unsigned)
LMP 05/09/2014 (Approximate)    Subjective:    Patient ID: Kendra Espinoza, female    DOB: 1960-10-01, 62 y.o.   MRN: [id number]  HPI: Kendra Espinoza is a 62 y.o. female  No chief complaint on file.  HYPERTENSION / HYPERLIPIDEMIA Satisfied with current treatment? yes Duration of hypertension: years BP monitoring frequency: daily BP range: 120/74 BP medication side effects: no Past BP meds: losartan (cozaar) Duration of hyperlipidemia: years Cholesterol medication side effects: no Cholesterol supplements: none Past cholesterol medications: none Medication compliance: excellent compliance Aspirin: no Recent stressors: no Recurrent headaches: yes Visual changes: no Palpitations: no Dyspnea: no Chest pain: no Lower extremity edema: no Dizzy/lightheaded: no  The 10-year ASCVD risk score (Arnett DK, et al., 2019) is: 12.5%   Values used to calculate the score:     Age: 31 years     Sex: Female     Is Non-Hispanic African American: No     Diabetic: Yes     Tobacco smoker: No     Systolic Blood Pressure: 149 mmHg     Is BP treated: Yes     HDL Cholesterol: 51 mg/dL     Total Cholesterol: 253 mg/dL   DIABETES Hypoglycemic episodes:no Polydipsia/polyuria: no Visual disturbance: no Chest pain: no Paresthesias: no Glucose Monitoring: no  Accucheck frequency: Not Checking  Fasting glucose:  Post prandial:  Evening:  Before meals: Taking Insulin?: no  Long acting insulin:  Short acting insulin: Blood Pressure Monitoring: daily Retinal Examination: Not up to Date Foot Exam: Up to Date Diabetic Education: Not Completed Pneumovax: Up to Date Influenza: Up to Date Aspirin: no  MOOD Patient states she is doing well.  Sometimes she feels a little lonely. Doesn't eel like she needs any medication.  Denies concerns at visit today.   [place] Office Visit from 05/19/2022 in [facility]  PHQ-9 Total Score 9         05/19/2022    1:55 PM 10/04/2021     9:34 AM 09/20/2021   10:00 AM 08/18/2021    3:41 PM  GAD 7 : Generalized Anxiety Score  Nervous, Anxious, on Edge '1 3 2 2  '$ Control/stop worrying '1 3 2 2  '$ Worry too much - different things '1 3 2 2  '$ Trouble relaxing '1 3 3 2  '$ Restless '1 3 2 '$ 0  Easily annoyed or irritable '1 2 1 '$ 0  Afraid - awful might happen '1 2 1 '$ 0  Total GAD 7 Score '7 19 13 8  '$ Anxiety Difficulty Somewhat difficult Very difficult Extremely difficult Somewhat difficult    Relevant past medical, surgical, family and social history reviewed and updated as indicated. Interim medical history since our last visit reviewed. Allergies and medications reviewed and updated.  Review of Systems  Per HPI unless specifically indicated above     Objective:    LMP 05/09/2014 (Approximate)   Wt Readings from Last 3 Encounters:  05/19/22 200 lb 12.8 oz (91.1 kg)  09/20/21 201 lb 12.8 oz (91.5 kg)  08/06/21 201 lb 12.8 oz (91.5 kg)    Physical Exam  Results for orders placed or performed in visit on 05/19/22  Lipid panel  Result Value Ref Range   Cholesterol, Total 255 (H) 100 - 199 mg/dL   Triglycerides 186 (H) 0 - 149 mg/dL   HDL 53 >39 mg/dL   VLDL Cholesterol Cal 34 5 - 40 mg/dL   LDL Chol Calc (NIH) 168 (H) 0 - 99 mg/dL  Chol/HDL Ratio 4.8 (H) 0.0 - 4.4 ratio  HgB A1c  Result Value Ref Range   Hgb A1c MFr Bld 6.7 (H) 4.8 - 5.6 %   Est. average glucose Bld gHb Est-mCnc 146 mg/dL  Comp Met (CMET)  Result Value Ref Range   Glucose 166 (H) 70 - 99 mg/dL   BUN 10 8 - 27 mg/dL   Creatinine, Ser 0.91 0.57 - 1.00 mg/dL   eGFR 71 >59 mL/min/1.73   BUN/Creatinine Ratio 11 (L) 12 - 28   Sodium 138 134 - 144 mmol/L   Potassium 3.9 3.5 - 5.2 mmol/L   Chloride 103 96 - 106 mmol/L   CO2 19 (L) 20 - 29 mmol/L   Calcium 9.3 8.7 - 10.3 mg/dL   Total Protein 6.7 6.0 - 8.5 g/dL   Albumin 4.4 3.9 - 4.9 g/dL   Globulin, Total 2.3 1.5 - 4.5 g/dL   Albumin/Globulin Ratio 1.9 1.2 - 2.2   Bilirubin Total 0.6 0.0 - 1.2 mg/dL    Alkaline Phosphatase 99 44 - 121 IU/L   AST 20 0 - 40 IU/L   ALT 28 0 - 32 IU/L      Assessment & Plan:   Problem List Items Addressed This Visit       Cardiovascular and Mediastinum   Primary hypertension - Primary     Endocrine   Type 2 diabetes with complication ([place])     Other   Obesity   Depression, recurrent (HCC)   Vitamin D deficiency   Hyperlipidemia     Follow up plan: No follow-ups on file.

## 2022-08-16 ENCOUNTER — Ambulatory Visit (INDEPENDENT_AMBULATORY_CARE_PROVIDER_SITE_OTHER): Payer: Self-pay | Admitting: Nurse Practitioner

## 2022-08-16 ENCOUNTER — Encounter: Payer: Self-pay | Admitting: Nurse Practitioner

## 2022-08-16 VITALS — BP 128/81 | HR 65 | Temp 97.8°F | Wt 206.1 lb

## 2022-08-16 DIAGNOSIS — I10 Essential (primary) hypertension: Secondary | ICD-10-CM

## 2022-08-16 DIAGNOSIS — R052 Subacute cough: Secondary | ICD-10-CM

## 2022-08-16 DIAGNOSIS — F339 Major depressive disorder, recurrent, unspecified: Secondary | ICD-10-CM

## 2022-08-16 DIAGNOSIS — E785 Hyperlipidemia, unspecified: Secondary | ICD-10-CM

## 2022-08-16 DIAGNOSIS — Z23 Encounter for immunization: Secondary | ICD-10-CM

## 2022-08-16 DIAGNOSIS — E118 Type 2 diabetes mellitus with unspecified complications: Secondary | ICD-10-CM

## 2022-08-16 DIAGNOSIS — E559 Vitamin D deficiency, unspecified: Secondary | ICD-10-CM

## 2022-08-16 DIAGNOSIS — E6609 Other obesity due to excess calories: Secondary | ICD-10-CM

## 2022-08-16 DIAGNOSIS — Z6835 Body mass index (BMI) 35.0-35.9, adult: Secondary | ICD-10-CM

## 2022-08-16 MED ORDER — METHYLPREDNISOLONE 4 MG PO TBPK: ORAL_TABLET | ORAL | 0 refills | Status: DC

## 2022-08-16 NOTE — Assessment & Plan Note (Signed)
Labs ordered at visit today.  Will make recommendations based on lab results.   

## 2022-08-16 NOTE — Assessment & Plan Note (Signed)
Chronic.  Controlled without medication..  Labs ordered today.  Return to clinic in 6 months for reevaluation.  Call sooner if concerns arise.  ° °

## 2022-08-16 NOTE — Assessment & Plan Note (Signed)
Chronic.  Controlled.  Continue with current medication regimen on Losartan '50mg'$ .  Refills sent today.  Labs ordered today.  Return to clinic in 6 months for reevaluation.  Call sooner if concerns arise.

## 2022-08-16 NOTE — Assessment & Plan Note (Signed)
Recommended eating smaller high protein, low fat meals more frequently and exercising 30 mins a day 5 times a week with a goal of 10-15lb weight loss in the next 3 months.  

## 2022-08-16 NOTE — Assessment & Plan Note (Signed)
Chronic.  Controlled.  Agreed to restart Atorvastatin '40mg'$  daily.  Explained ASCVD risk score with patient as well as cardioprotective benefits with having diabetes.  Labs ordered today.  Return to clinic in 6 months for reevaluation.  Call sooner if concerns arise.

## 2022-08-16 NOTE — Assessment & Plan Note (Signed)
Post viral infection about 1 month ago.  Will treat with prednisone.  Follow up if not improved.

## 2022-08-16 NOTE — Assessment & Plan Note (Signed)
Chronic.  Controlled without medication.  Discussed with patient medication options.  She has been on Metformin in the past and had a lot of side effects.  Will order Glipizide if A1c is above 7%.  Labs ordered today.  Return to clinic in 6 months for reevaluation.  Call sooner if concerns arise.

## 2022-08-17 LAB — COMPREHENSIVE METABOLIC PANEL
ALT: 24 IU/L (ref 0–32)
AST: 15 IU/L (ref 0–40)
Albumin/Globulin Ratio: 2.1 (ref 1.2–2.2)
Albumin: 4.5 g/dL (ref 3.9–4.9)
Alkaline Phosphatase: 104 IU/L (ref 44–121)
BUN/Creatinine Ratio: 12 (ref 12–28)
BUN: 10 mg/dL (ref 8–27)
Bilirubin Total: 0.7 mg/dL (ref 0.0–1.2)
CO2: 18 mmol/L — ABNORMAL LOW (ref 20–29)
Calcium: 9.6 mg/dL (ref 8.7–10.3)
Chloride: 107 mmol/L — ABNORMAL HIGH (ref 96–106)
Creatinine, Ser: 0.84 mg/dL (ref 0.57–1.00)
Globulin, Total: 2.1 g/dL (ref 1.5–4.5)
Glucose: 154 mg/dL — ABNORMAL HIGH (ref 70–99)
Potassium: 4.1 mmol/L (ref 3.5–5.2)
Sodium: 140 mmol/L (ref 134–144)
Total Protein: 6.6 g/dL (ref 6.0–8.5)
eGFR: 78 mL/min/{1.73_m2} (ref >59)

## 2022-08-17 LAB — HEMOGLOBIN A1C
Est. average glucose Bld gHb Est-mCnc: 151 mg/dL
Hgb A1c MFr Bld: 6.9 % — ABNORMAL HIGH (ref 4.8–5.6)

## 2022-08-17 LAB — VITAMIN D 25 HYDROXY (VIT D DEFICIENCY, FRACTURES): Vit D, 25-Hydroxy: 32.1 ng/mL (ref 30.0–100.0)

## 2022-08-17 NOTE — Progress Notes (Signed)
Please let patient know that her A1c did increase again to 6.9.  Since she is almost at 7, I recommend we start the Glipizide that we discussed yesterday during the visit.  If she agrees, I will send this into the pharmacy.  No other concerns at this time.

## 2022-08-18 ENCOUNTER — Encounter: Payer: Self-pay | Admitting: Nurse Practitioner

## 2022-08-18 MED ORDER — GLIPIZIDE 5 MG PO TABS: 5.0000 mg | ORAL_TABLET | Freq: Two times a day (BID) | ORAL | 1 refills | Status: DC

## 2022-11-17 ENCOUNTER — Ambulatory Visit: Payer: Self-pay | Admitting: Nurse Practitioner

## 2022-11-29 ENCOUNTER — Other Ambulatory Visit: Payer: Self-pay | Admitting: Nurse Practitioner

## 2022-11-29 DIAGNOSIS — I10 Essential (primary) hypertension: Secondary | ICD-10-CM

## 2022-11-29 NOTE — Telephone Encounter (Signed)
Requested Prescriptions  Pending Prescriptions Disp Refills   atorvastatin (LIPITOR) 40 MG tablet [Pharmacy Med Name: Atorvastatin Calcium 40 MG Oral Tablet] 90 tablet 1    Sig: Take 1 tablet by mouth once daily     Cardiovascular:  Antilipid - Statins Failed - 11/29/2022  5:09 PM      Failed - Lipid Panel in normal range within the last 12 months    Cholesterol, Total  Date Value Ref Range Status  05/19/2022 255 (H) 100 - 199 mg/dL Final   LDL Chol Calc (NIH)  Date Value Ref Range Status  05/19/2022 168 (H) 0 - 99 mg/dL Final   HDL  Date Value Ref Range Status  05/19/2022 53 >39 mg/dL Final   Triglycerides  Date Value Ref Range Status  05/19/2022 186 (H) 0 - 149 mg/dL Final         Passed - Patient is not pregnant      Passed - Valid encounter within last 12 months    Recent Outpatient Visits           3 months ago Primary hypertension   [place] [facility] Larae Grooms, NP   6 months ago Type 2 diabetes with complication [facility])   [place] [facility] Larae Grooms, NP   1 year ago Depression, recurrent (HCC)   [place] [facility] Vigg, Avanti, MD   1 year ago Diabetes mellitus without complication (HCC)   [place] [facility] Vigg, Avanti, MD   1 year ago COVID-19   [place] [facility] Vigg, Avanti, MD       Future Appointments             In 2 weeks Larae Grooms, NP [place] [facility]             losartan (COZAAR) 50 MG tablet [Pharmacy Med Name: Losartan Potassium 50 MG Oral Tablet] 90 tablet 0    Sig: Take 1 tablet by mouth once daily     Cardiovascular:  Angiotensin Receptor Blockers Passed - 11/29/2022  5:09 PM      Passed - Cr in normal range and within 180 days    Creatinine, Ser  Date Value Ref Range Status  08/16/2022 0.84 0.57 - 1.00 mg/dL Final         Passed - K in normal range and within 180 days    Potassium  Date  Value Ref Range Status  08/16/2022 4.1 3.5 - 5.2 mmol/L Final         Passed - Patient is not pregnant      Passed - Last BP in normal range    BP Readings from Last 1 Encounters:  08/16/22 128/81         Passed - Valid encounter within last 6 months    Recent Outpatient Visits           3 months ago Primary hypertension   [place] [facility] Larae Grooms, NP   6 months ago Type 2 diabetes with complication [facility])   [place] [facility] Larae Grooms, NP   1 year ago Depression, recurrent (HCC)   [place] [facility] Vigg, Avanti, MD   1 year ago Diabetes mellitus without complication (HCC)   [place] [facility] Vigg, Avanti, MD   1 year ago COVID-19   [place] [facility] Loura Pardon, MD  Future Appointments             In 2 weeks Larae Grooms, NP [place] [facility]

## 2022-12-07 ENCOUNTER — Other Ambulatory Visit: Payer: Self-pay | Admitting: Nurse Practitioner

## 2022-12-07 DIAGNOSIS — G43909 Migraine, unspecified, not intractable, without status migrainosus: Secondary | ICD-10-CM

## 2022-12-07 NOTE — Telephone Encounter (Signed)
Requested Prescriptions  Pending Prescriptions Disp Refills   topiramate (TOPAMAX) 50 MG tablet [Pharmacy Med Name: Topiramate 50 MG Oral Tablet] 180 tablet 1    Sig: Take 1 tablet by mouth twice daily     Neurology: Anticonvulsants - topiramate & zonisamide Failed - 12/07/2022  6:52 AM      Failed - CO2 in normal range and within 360 days    CO2  Date Value Ref Range Status  08/16/2022 18 (L) 20 - 29 mmol/L Final         Passed - Cr in normal range and within 360 days    Creatinine, Ser  Date Value Ref Range Status  08/16/2022 0.84 0.57 - 1.00 mg/dL Final         Passed - ALT in normal range and within 360 days    ALT  Date Value Ref Range Status  08/16/2022 24 0 - 32 IU/L Final         Passed - AST in normal range and within 360 days    AST  Date Value Ref Range Status  08/16/2022 15 0 - 40 IU/L Final         Passed - Completed PHQ-2 or PHQ-9 in the last 360 days      Passed - Valid encounter within last 12 months    Recent Outpatient Visits           3 months ago Primary hypertension   [place] [facility] Larae Grooms, NP   6 months ago Type 2 diabetes with complication [facility])   [place] [facility] Larae Grooms, NP   1 year ago Depression, recurrent (HCC)   [place] [facility] Vigg, Avanti, MD   1 year ago Diabetes mellitus without complication (HCC)   [place] [facility] Vigg, Avanti, MD   1 year ago COVID-19   [place] [facility] Vigg, Roma Schanz, MD       Future Appointments             In 1 week Larae Grooms, NP [place] [facility]

## 2022-12-15 ENCOUNTER — Ambulatory Visit: Payer: Self-pay | Admitting: Nurse Practitioner

## 2022-12-15 NOTE — Progress Notes (Deleted)
LMP 05/09/2014 (Approximate)    Subjective:    Patient ID: Kendra Espinoza, female    DOB: 12/15/1960, 62 y.o.   MRN: [id number]  HPI: Kendra Espinoza is a 62 y.o. female  No chief complaint on file.  HYPERTENSION / HYPERLIPIDEMIA Satisfied with current treatment? yes Duration of hypertension: years BP monitoring frequency: daily BP range: 120/74 BP medication side effects: no Past BP meds: losartan (cozaar) Duration of hyperlipidemia: years Cholesterol medication side effects: no Cholesterol supplements: none Past cholesterol medications: none Medication compliance: excellent compliance Aspirin: no Recent stressors: no Recurrent headaches: yes Visual changes: no Palpitations: no Dyspnea: no Chest pain: no Lower extremity edema: no Dizzy/lightheaded: no  The 10-year ASCVD risk score (Arnett DK, et al., 2019) is: 12.5%   Values used to calculate the score:     Age: 12 years     Sex: Female     Is Non-Hispanic African American: No     Diabetic: Yes     Tobacco smoker: No     Systolic Blood Pressure: 128 mmHg     Is BP treated: Yes     HDL Cholesterol: 51 mg/dL     Total Cholesterol: 253 mg/dL   DIABETES Hypoglycemic episodes:no Polydipsia/polyuria: no Visual disturbance: no Chest pain: no Paresthesias: no Glucose Monitoring: no  Accucheck frequency: Not Checking  Fasting glucose:  Post prandial:  Evening:  Before meals: Taking Insulin?: no  Long acting insulin:  Short acting insulin: Blood Pressure Monitoring: daily Retinal Examination: Not up to Date Foot Exam: Up to Date Diabetic Education: Not Completed Pneumovax: Up to Date Influenza: Up to Date Aspirin: no  MOOD Patient states she is doing well.  Sometimes she feels a little lonely. Doesn't eel like she needs any medication.  Denies concerns at visit today.   Flowsheet Row Office Visit from 08/16/2022 in [facility]  PHQ-9 Total Score 13         08/16/2022   10:06  AM 05/19/2022    1:55 PM 10/04/2021    9:34 AM 09/20/2021   10:00 AM  GAD 7 : Generalized Anxiety Score  Nervous, Anxious, on Edge 1 1 3 2   Control/stop worrying 1 1 3 2   Worry too much - different things 1 1 3 2   Trouble relaxing 1 1 3 3   Restless 1 1 3 2   Easily annoyed or irritable 1 1 2 1   Afraid - awful might happen 1 1 2 1   Total GAD 7 Score 7 7 19 13   Anxiety Difficulty Somewhat difficult Somewhat difficult Very difficult Extremely difficult      HEADACHES Patient states she has been getting more headaches recently.  States that the topamax was working well for her.  Feels like it needs to be increased.   Relevant past medical, surgical, family and social history reviewed and updated as indicated. Interim medical history since our last visit reviewed. Allergies and medications reviewed and updated.  Review of Systems  Eyes:  Negative for visual disturbance.  Respiratory:  Negative for cough, chest tightness and shortness of breath.   Cardiovascular:  Negative for chest pain, palpitations and leg swelling.  Neurological:  Positive for headaches. Negative for dizziness.  Psychiatric/Behavioral:  Positive for decreased concentration. Negative for suicidal ideas. The patient is not nervous/anxious.     Per HPI unless specifically indicated above     Objective:    LMP 05/09/2014 (Approximate)   Wt Readings from Last 3 Encounters:  08/16/22 206 lb  1.6 oz (93.5 kg)  05/19/22 200 lb 12.8 oz (91.1 kg)  09/20/21 201 lb 12.8 oz (91.5 kg)    Physical Exam Vitals and nursing note reviewed.  Constitutional:      General: She is not in acute distress.    Appearance: Normal appearance. She is obese. She is not ill-appearing, toxic-appearing or diaphoretic.  HENT:     Head: Normocephalic.     Right Ear: External ear normal.     Left Ear: External ear normal.     Nose: Nose normal.     Mouth/Throat:     Mouth: Mucous membranes are moist.     Pharynx: Oropharynx is clear.   Eyes:     General:        Right eye: No discharge.        Left eye: No discharge.     Extraocular Movements: Extraocular movements intact.     Conjunctiva/sclera: Conjunctivae normal.     Pupils: Pupils are equal, round, and reactive to light.  Cardiovascular:     Rate and Rhythm: Normal rate and regular rhythm.     Heart sounds: No murmur heard. Pulmonary:     Effort: Pulmonary effort is normal. No respiratory distress.     Breath sounds: Normal breath sounds. No wheezing or rales.  Musculoskeletal:     Cervical back: Normal range of motion and neck supple.  Skin:    General: Skin is warm and dry.     Capillary Refill: Capillary refill takes less than 2 seconds.  Neurological:     General: No focal deficit present.     Mental Status: She is alert and oriented to person, place, and time. Mental status is at baseline.  Psychiatric:        Mood and Affect: Mood normal.        Behavior: Behavior normal.        Thought Content: Thought content normal.        Judgment: Judgment normal.    Results for orders placed or performed in visit on 08/16/22  Comp Met (CMET)  Result Value Ref Range   Glucose 154 (H) 70 - 99 mg/dL   BUN 10 8 - 27 mg/dL   Creatinine, Ser 1.61 0.57 - 1.00 mg/dL   eGFR 78 >09 UE/AVW/0.98   BUN/Creatinine Ratio 12 12 - 28   Sodium 140 134 - 144 mmol/L   Potassium 4.1 3.5 - 5.2 mmol/L   Chloride 107 (H) 96 - 106 mmol/L   CO2 18 (L) 20 - 29 mmol/L   Calcium 9.6 8.7 - 10.3 mg/dL   Total Protein 6.6 6.0 - 8.5 g/dL   Albumin 4.5 3.9 - 4.9 g/dL   Globulin, Total 2.1 1.5 - 4.5 g/dL   Albumin/Globulin Ratio 2.1 1.2 - 2.2   Bilirubin Total 0.7 0.0 - 1.2 mg/dL   Alkaline Phosphatase 104 44 - 121 IU/L   AST 15 0 - 40 IU/L   ALT 24 0 - 32 IU/L  HgB A1c  Result Value Ref Range   Hgb A1c MFr Bld 6.9 (H) 4.8 - 5.6 %   Est. average glucose Bld gHb Est-mCnc 151 mg/dL  Vitamin D (25 hydroxy)  Result Value Ref Range   Vit D, 25-Hydroxy 32.1 30.0 - 100.0 ng/mL       Assessment & Plan:   Problem List Items Addressed This Visit       Cardiovascular and Mediastinum   Primary hypertension - Primary     Endocrine  Type 2 diabetes with complication (HCC)     Other   Depression, recurrent (HCC)   Vitamin D deficiency   Hyperlipidemia     Follow up plan: No follow-ups on file.

## 2022-12-26 ENCOUNTER — Telehealth: Payer: Self-pay

## 2022-12-26 NOTE — Transitions of Care (Post Inpatient/ED Visit) (Unsigned)
   12/26/2022  Name: Kendra Espinoza MRN: [id number] DOB: 1961-01-18  Today's TOC FU Call Status: Today's TOC FU Call Status:: Unsuccessul Call (1st Attempt) Unsuccessful Call (1st Attempt) Date: 12/26/22  Attempted to reach the patient regarding the most recent Inpatient/ED visit.  Follow Up Plan: Additional outreach attempts will be made to reach the patient to complete the Transitions of Care (Post Inpatient/ED visit) call.   Signature: Wilhemena Durie, CMA

## 2022-12-27 NOTE — Transitions of Care (Post Inpatient/ED Visit) (Signed)
   12/27/2022  Name: SHEYLA ZAFFINO MRN: [id number] DOB: 10-19-1960  Today's TOC FU Call Status: Today's TOC FU Call Status:: Successful TOC FU Call Competed Unsuccessful Call (1st Attempt) Date: 12/26/22 [facility] FU Call Complete Date: 12/27/22  Transition Care Management Follow-up Telephone Call Date of Discharge: 12/23/22 Discharge Facility: Other [facility] Facility) Name of Other (Non[facility]) Discharge Facility: [facility] Type of Discharge: Emergency Department Reason for ED Visit: Orthopedic Conditions Orthopedic/Injury Diagnosis: Fracture How have you been since you were released from the hospital?: Worse Any questions or concerns?: No  Items Reviewed: Did you receive and understand the discharge instructions provided?: Yes Medications obtained,verified, and reconciled?: Yes (Medications Reviewed) Any new allergies since your discharge?: No Dietary orders reviewed?: NA Do you have support at home?: No  Medications Reviewed Today: Medications Reviewed Today     Reviewed by Pablo Ledger, CMA (Certified Medical Assistant) on 12/27/22 at [phone]  Med List Status: <None>   Medication Order Taking? Sig Documenting Provider Last Dose Status Informant    Discontinued 05/13/20 1217   atorvastatin (LIPITOR) 40 MG tablet [id number] Yes Take 1 tablet by mouth once daily Larae Grooms, NP Taking Active   glipiZIDE (GLUCOTROL) 5 MG tablet [id number] Yes Take 1 tablet (5 mg total) by mouth 2 (two) times daily before a meal. Larae Grooms, NP Taking Active   losartan (COZAAR) 50 MG tablet [id number] Yes Take 1 tablet by mouth once daily Larae Grooms, NP Taking Active   topiramate (TOPAMAX) 50 MG tablet [id number] Yes Take 1 tablet by mouth twice daily Larae Grooms, NP Taking Active             Home Care and Equipment/Supplies: Were Home Health Services Ordered?: No Any new equipment or medical supplies ordered?: No  Functional Questionnaire: Do you need  assistance with bathing/showering or dressing?: No Do you need assistance with meal preparation?: No Do you need assistance with eating?: No Do you have difficulty maintaining continence: No Do you need assistance with getting out of bed/getting out of a chair/moving?: No Do you have difficulty managing or taking your medications?: No  Follow up appointments reviewed: PCP Follow-up appointment confirmed?: Yes Date of PCP follow-up appointment?: 12/30/22 Follow-up Provider: Jacquelin Hawking, PA Specialist Hospital Follow-up appointment confirmed?: Yes Date of Specialist follow-up appointment?: 01/02/23 Follow-Up Specialty Provider:: Orthopedics at [facility] Do you need transportation to your follow-up appointment?: No Do you understand care options if your condition(s) worsen?: Yes-patient verbalized understanding    SIGNATURE: Wilhemena Durie, CMA

## 2022-12-27 NOTE — Telephone Encounter (Signed)
Pt will be home all day today 6/11 fu at [phone] anytime!

## 2022-12-30 ENCOUNTER — Ambulatory Visit: Payer: Self-pay | Admitting: Physician Assistant

## 2023-02-11 ENCOUNTER — Other Ambulatory Visit: Payer: Self-pay | Admitting: Nurse Practitioner

## 2023-02-13 NOTE — Telephone Encounter (Signed)
Requested Prescriptions  Pending Prescriptions Disp Refills   glipiZIDE (GLUCOTROL) 5 MG tablet [Pharmacy Med Name: glipiZIDE 5 MG Oral Tablet] 180 tablet 0    Sig: TAKE 1 TABLET BY MOUTH TWICE DAILY BEFORE A MEAL     Endocrinology:  Diabetes - Sulfonylureas Passed - 02/11/2023  9:19 AM      Passed - HBA1C is between 0 and 7.9 and within 180 days    HB A1C (BAYER DCA - WAIVED)  Date Value Ref Range Status  05/25/2021 6.3 (H) 4.8 - 5.6 % Final    Comment:             Prediabetes: 5.7 - 6.4          Diabetes: >6.4          Glycemic control for adults with diabetes: <7.0    Hgb A1c MFr Bld  Date Value Ref Range Status  08/16/2022 6.9 (H) 4.8 - 5.6 % Final    Comment:             Prediabetes: 5.7 - 6.4          Diabetes: >6.4          Glycemic control for adults with diabetes: <7.0          Passed - Cr in normal range and within 360 days    Creatinine, Ser  Date Value Ref Range Status  08/16/2022 0.84 0.57 - 1.00 mg/dL Final         Passed - Valid encounter within last 6 months    Recent Outpatient Visits           6 months ago Primary hypertension   [place] [facility] Larae Grooms, NP   9 months ago Type 2 diabetes with complication [facility])   [place] [facility] Larae Grooms, NP   1 year ago Depression, recurrent (HCC)   [place] [facility] Vigg, Avanti, MD   1 year ago Diabetes mellitus without complication (HCC)   [place] [facility] Vigg, Avanti, MD   1 year ago COVID-19   [place] [facility] Loura Pardon, MD

## 2023-02-26 ENCOUNTER — Other Ambulatory Visit: Payer: Self-pay | Admitting: Nurse Practitioner

## 2023-02-26 DIAGNOSIS — I10 Essential (primary) hypertension: Secondary | ICD-10-CM

## 2023-02-28 NOTE — Telephone Encounter (Signed)
Requested Prescriptions  Pending Prescriptions Disp Refills   losartan (COZAAR) 50 MG tablet [Pharmacy Med Name: Losartan Potassium 50 MG Oral Tablet] 30 tablet 0    Sig: Take 1 tablet (50 mg total) by mouth daily. OFFICE VISIT NEEDED FOR ADDITIONAL REFILLS     Cardiovascular:  Angiotensin Receptor Blockers Failed - 02/26/2023  9:46 PM      Failed - Cr in normal range and within 180 days    Creatinine, Ser  Date Value Ref Range Status  08/16/2022 0.84 0.57 - 1.00 mg/dL Final         Failed - K in normal range and within 180 days    Potassium  Date Value Ref Range Status  08/16/2022 4.1 3.5 - 5.2 mmol/L Final         Failed - Valid encounter within last 6 months    Recent Outpatient Visits           6 months ago Primary hypertension   [place] [facility] Larae Grooms, NP   9 months ago Type 2 diabetes with complication [facility])   [place] [facility] Larae Grooms, NP   1 year ago Depression, recurrent (HCC)   [place] [facility] Vigg, Avanti, MD   1 year ago Diabetes mellitus without complication [facility])   [place] [facility] Vigg, Avanti, MD   1 year ago COVID-19   [place] [facility] [facility] Vigg, Avanti, MD              Passed - Patient is not pregnant      Passed - Last BP in normal range    BP Readings from Last 1 Encounters:  08/16/22 128/81

## 2023-02-28 NOTE — Telephone Encounter (Signed)
Patient called, left VM to return the call to the office to schedule an OV for follow up. Patient was to return per last OV note in May and there are 2 cancelled appointments since then. Courtesy refill given, appointment needed.

## 2023-03-05 NOTE — Patient Instructions (Signed)

## 2023-03-07 ENCOUNTER — Ambulatory Visit (INDEPENDENT_AMBULATORY_CARE_PROVIDER_SITE_OTHER): Payer: Self-pay | Admitting: Nurse Practitioner

## 2023-03-07 ENCOUNTER — Encounter: Payer: Self-pay | Admitting: Nurse Practitioner

## 2023-03-07 VITALS — BP 119/76 | HR 67 | Temp 98.3°F | Ht 64.5 in | Wt 201.8 lb

## 2023-03-07 DIAGNOSIS — I152 Hypertension secondary to endocrine disorders: Secondary | ICD-10-CM

## 2023-03-07 DIAGNOSIS — E1169 Type 2 diabetes mellitus with other specified complication: Secondary | ICD-10-CM

## 2023-03-07 DIAGNOSIS — Z6835 Body mass index (BMI) 35.0-35.9, adult: Secondary | ICD-10-CM

## 2023-03-07 DIAGNOSIS — G43009 Migraine without aura, not intractable, without status migrainosus: Secondary | ICD-10-CM

## 2023-03-07 DIAGNOSIS — E1159 Type 2 diabetes mellitus with other circulatory complications: Secondary | ICD-10-CM

## 2023-03-07 DIAGNOSIS — E785 Hyperlipidemia, unspecified: Secondary | ICD-10-CM

## 2023-03-07 DIAGNOSIS — E559 Vitamin D deficiency, unspecified: Secondary | ICD-10-CM

## 2023-03-07 DIAGNOSIS — F339 Major depressive disorder, recurrent, unspecified: Secondary | ICD-10-CM

## 2023-03-07 DIAGNOSIS — E669 Obesity, unspecified: Secondary | ICD-10-CM

## 2023-03-07 DIAGNOSIS — F5102 Adjustment insomnia: Secondary | ICD-10-CM

## 2023-03-07 DIAGNOSIS — Z7984 Long term (current) use of oral hypoglycemic drugs: Secondary | ICD-10-CM

## 2023-03-07 DIAGNOSIS — E6609 Other obesity due to excess calories: Secondary | ICD-10-CM

## 2023-03-07 LAB — MICROALBUMIN, URINE WAIVED
Creatinine, Urine Waived: 300 mg/dL (ref 10–300)
Microalb, Ur Waived: 30 mg/L — ABNORMAL HIGH (ref 0–19)
Microalb/Creat Ratio: <30 mg/g (ref <30)

## 2023-03-07 LAB — BAYER DCA HB A1C WAIVED: HB A1C (BAYER DCA - WAIVED): 6.2 % — ABNORMAL HIGH (ref 4.8–5.6)

## 2023-03-07 MED ORDER — GLIPIZIDE 5 MG PO TABS: 5.0000 mg | ORAL_TABLET | Freq: Two times a day (BID) | ORAL | 1 refills | Status: DC

## 2023-03-07 MED ORDER — LOSARTAN POTASSIUM 50 MG PO TABS
50.0000 mg | ORAL_TABLET | Freq: Every day | ORAL | 1 refills | Status: DC
Start: 2023-03-07 — End: 2023-09-25

## 2023-03-07 MED ORDER — ATORVASTATIN CALCIUM 40 MG PO TABS: 40.0000 mg | ORAL_TABLET | Freq: Every day | ORAL | 1 refills | Status: DC

## 2023-03-07 MED ORDER — SERTRALINE HCL 25 MG PO TABS: 25.0000 mg | ORAL_TABLET | Freq: Every day | ORAL | 0 refills | Status: DC

## 2023-03-07 NOTE — Assessment & Plan Note (Signed)
Chronic, stable.  Will continue Topamax, which offers her benefit.  Some exacerbation at present due to stressors, refer to depression plan of care.

## 2023-03-07 NOTE — Assessment & Plan Note (Signed)
Chronic, recheck level today and restart supplement as needed.

## 2023-03-07 NOTE — Assessment & Plan Note (Signed)
Chronic, ongoing.  Continue current medication regimen and adjust as needed. Lipid panel today. 

## 2023-03-07 NOTE — Assessment & Plan Note (Addendum)
Chronic, stable with A1c remaining at goal today = 6.2% and urine ALB 17 March 2023.  At this time continue Glipizide due to cost effectiveness and overall benefit to sugar levels.  Recommend she check blood sugar at least weekly and document for provider visits.  Alert provider if any consistent blood sugar <70.  Return in 6 months. - Needs eye exam.  Foot exam up to date. - ARB and statin on board - May get Pneumococcal vaccine next visit and flu.

## 2023-03-07 NOTE — Progress Notes (Signed)
BP 119/76   Pulse 67   Temp 98.3 F (36.8 C) (Oral)   Ht 5' 4.5" (1.638 m)   Wt 201 lb 12.8 oz (91.5 kg)   LMP 05/09/2014 (Approximate)   SpO2 98%   BMI 34.10 kg/m    Subjective:    Patient ID: Kendra Espinoza, female    DOB: March 04, 1961, 62 y.o.   MRN: [id number]  HPI: Kendra Espinoza is a 62 y.o. female  Chief Complaint  Patient presents with   Depression   Diabetes   Hyperlipidemia   Hypertension   She is okay with all labs today, provided her with [facility] assistance forms to use if needed to assist with cost.  DIABETES Last A1c 6.9% in January. On Glipizide 5 MG BID. History of low Vitamin D, no supplement at present. Hypoglycemic episodes:no Polydipsia/polyuria: no Visual disturbance: no Chest pain: no Paresthesias: no Glucose Monitoring: no  Accucheck frequency: Not Checking  Fasting glucose:  Post prandial:  Evening:  Before meals: Taking Insulin?: no  Long acting insulin:  Short acting insulin: Blood Pressure Monitoring: not checking Retinal Examination: Not up to Date Foot Exam: Up to Date Pneumovax: Up to Date Influenza: Up to Date Aspirin: no   HYPERTENSION / HYPERLIPIDEMIA Taking Atorvastatin and Losartan. Satisfied with current treatment? yes Duration of hypertension: chronic BP monitoring frequency: not checking BP range:  BP medication side effects: no Duration of hyperlipidemia: chronic Cholesterol medication side effects: no Cholesterol supplements: none Medication compliance: good compliance Aspirin: no Recent stressors: no Recurrent headaches: no Visual changes: no Palpitations: no Dyspnea: no Chest pain: no Lower extremity edema: no Dizzy/lightheaded: no   DEPRESSION & MIGRAINES Taking Topamax 50 MG BID for migraines and mood. Father-in-law moved in with them 4 weeks ago, he is a diabetic and overdosed on insulin -- went in diabetic coma.  He is a Cytogeneticist and they are considering placement, but he owns property and will not let  them sell it. They are looking into adult daycare.  Not a pleasant man at baseline, stressful situation.  Migraines have become worse with stressors. Mood status: exacerbated Satisfied with current treatment?: yes Symptom severity: moderate  Duration of current treatment : chronic Side effects: no Medication compliance: good compliance Psychotherapy/counseling: none Depressed mood: yes Anxious mood: yes Anhedonia: yes Significant weight loss or gain: no Insomnia: yes hard to fall asleep Fatigue: yes Feelings of worthlessness or guilt: yes -- guilt only Impaired concentration/indecisiveness: no Suicidal ideations: no Hopelessness: no Crying spells: yes    03/07/2023    1:12 PM 08/16/2022   10:05 AM 05/19/2022    1:55 PM 10/04/2021    9:33 AM 09/20/2021   10:00 AM  Depression screen PHQ 2/9  Decreased Interest 2 2 1 2 2   Down, Depressed, Hopeless 2 1 1 2 2   PHQ - 2 Score 4 3 2 4 4   Altered sleeping 3 2 2 3 3   Tired, decreased energy 3 2 2 3 3   Change in appetite 3 2 2 3 3   Feeling bad or failure about yourself  0 1 0 2 1  Trouble concentrating 2 2 1 3 2   Moving slowly or fidgety/restless 2 1 0 2 2  Suicidal thoughts 0 0 0 0 0  PHQ-9 Score 17 13 9 20 18   Difficult doing work/chores Somewhat difficult Somewhat difficult Somewhat difficult Very difficult Extremely dIfficult       03/07/2023    1:12 PM 08/16/2022   10:06 AM 05/19/2022  1:55 PM 10/04/2021    9:34 AM  GAD 7 : Generalized Anxiety Score  Nervous, Anxious, on Edge 3 1 1 3   Control/stop worrying 2 1 1 3   Worry too much - different things 2 1 1 3   Trouble relaxing 2 1 1 3   Restless 2 1 1 3   Easily annoyed or irritable 3 1 1 2   Afraid - awful might happen 2 1 1 2   Total GAD 7 Score 16 7 7 19   Anxiety Difficulty Very difficult Somewhat difficult Somewhat difficult Very difficult   Relevant past medical, surgical, family and social history reviewed and updated as indicated. Interim medical history since our last  visit reviewed. Allergies and medications reviewed and updated.  Review of Systems  Constitutional:  Negative for activity change, appetite change, diaphoresis, fatigue and fever.  Respiratory:  Negative for cough, chest tightness and shortness of breath.   Cardiovascular:  Negative for chest pain, palpitations and leg swelling.  Gastrointestinal: Negative.   Endocrine: Negative for polydipsia, polyphagia and polyuria.  Neurological:  Positive for headaches. Negative for dizziness, syncope, weakness, light-headedness and numbness.  Psychiatric/Behavioral:  Positive for sleep disturbance. Negative for decreased concentration, self-injury and suicidal ideas. The patient is nervous/anxious.     Per HPI unless specifically indicated above     Objective:    BP 119/76   Pulse 67   Temp 98.3 F (36.8 C) (Oral)   Ht 5' 4.5" (1.638 m)   Wt 201 lb 12.8 oz (91.5 kg)   LMP 05/09/2014 (Approximate)   SpO2 98%   BMI 34.10 kg/m   Wt Readings from Last 3 Encounters:  03/07/23 201 lb 12.8 oz (91.5 kg)  08/16/22 206 lb 1.6 oz (93.5 kg)  05/19/22 200 lb 12.8 oz (91.1 kg)    Physical Exam Vitals and nursing note reviewed.  Constitutional:      General: She is awake. She is not in acute distress.    Appearance: She is well-developed and well-groomed. She is obese. She is not ill-appearing or toxic-appearing.  HENT:     Head: Normocephalic.     Right Ear: Hearing and external ear normal.     Left Ear: Hearing and external ear normal.  Eyes:     General: Lids are normal.        Right eye: No discharge.        Left eye: No discharge.     Conjunctiva/sclera: Conjunctivae normal.     Pupils: Pupils are equal, round, and reactive to light.  Neck:     Thyroid: No thyromegaly.     Vascular: No carotid bruit.  Cardiovascular:     Rate and Rhythm: Normal rate and regular rhythm.     Heart sounds: Normal heart sounds. No murmur heard.    No gallop.  Pulmonary:     Effort: Pulmonary effort is  normal. No accessory muscle usage or respiratory distress.     Breath sounds: Normal breath sounds.  Abdominal:     General: Bowel sounds are normal. There is no distension.     Palpations: Abdomen is soft.     Tenderness: There is no abdominal tenderness.  Musculoskeletal:     Cervical back: Normal range of motion and neck supple.     Right lower leg: No edema.     Left lower leg: No edema.  Lymphadenopathy:     Cervical: No cervical adenopathy.  Skin:    General: Skin is warm and dry.  Neurological:  Mental Status: She is alert and oriented to person, place, and time.     Deep Tendon Reflexes: Reflexes are normal and symmetric.     Reflex Scores:      Brachioradialis reflexes are 2+ on the right side and 2+ on the left side.      Patellar reflexes are 2+ on the right side and 2+ on the left side. Psychiatric:        Attention and Perception: Attention normal.        Mood and Affect: Mood normal.        Speech: Speech normal.        Behavior: Behavior normal. Behavior is cooperative.        Thought Content: Thought content normal.     Results for orders placed or performed in visit on 08/16/22  Comp Met (CMET)  Result Value Ref Range   Glucose 154 (H) 70 - 99 mg/dL   BUN 10 8 - 27 mg/dL   Creatinine, Ser 7.82 0.57 - 1.00 mg/dL   eGFR 78 >95 AO/ZHY/8.65   BUN/Creatinine Ratio 12 12 - 28   Sodium 140 134 - 144 mmol/L   Potassium 4.1 3.5 - 5.2 mmol/L   Chloride 107 (H) 96 - 106 mmol/L   CO2 18 (L) 20 - 29 mmol/L   Calcium 9.6 8.7 - 10.3 mg/dL   Total Protein 6.6 6.0 - 8.5 g/dL   Albumin 4.5 3.9 - 4.9 g/dL   Globulin, Total 2.1 1.5 - 4.5 g/dL   Albumin/Globulin Ratio 2.1 1.2 - 2.2   Bilirubin Total 0.7 0.0 - 1.2 mg/dL   Alkaline Phosphatase 104 44 - 121 IU/L   AST 15 0 - 40 IU/L   ALT 24 0 - 32 IU/L  HgB A1c  Result Value Ref Range   Hgb A1c MFr Bld 6.9 (H) 4.8 - 5.6 %   Est. average glucose Bld gHb Est-mCnc 151 mg/dL  Vitamin D (25 hydroxy)  Result Value Ref  Range   Vit D, 25-Hydroxy 32.1 30.0 - 100.0 ng/mL      Assessment & Plan:   Problem List Items Addressed This Visit       Cardiovascular and Mediastinum   Hypertension associated with diabetes (HCC)    Chronic, ongoing.  BP at goal today with Losartan and tolerating well.  Recommend she monitor BP at least a few mornings a week at home and document.  DASH diet at home.  Continue current medication regimen and adjust as needed.  Labs today: CMP, TSH, lipid.  Return in 6 months.       Relevant Medications   losartan (COZAAR) 50 MG tablet   glipiZIDE (GLUCOTROL) 5 MG tablet   atorvastatin (LIPITOR) 40 MG tablet   Other Relevant Orders   Bayer DCA Hb A1c Waived   Microalbumin, Urine Waived   Comprehensive metabolic panel   TSH   CBC with Differential/Platelet   Migraine    Chronic, stable.  Will continue Topamax, which offers her benefit.  Some exacerbation at present due to stressors, refer to depression plan of care.      Relevant Medications   losartan (COZAAR) 50 MG tablet   atorvastatin (LIPITOR) 40 MG tablet   sertraline (ZOLOFT) 25 MG tablet     Endocrine   Hyperlipidemia associated with type 2 diabetes mellitus (HCC)    Chronic, ongoing.  Continue current medication regimen and adjust as needed.  Lipid panel today.      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  At this time continue Glipizide due to cost effectiveness and overall benefit to sugar levels.  Recommend she check blood sugar at least weekly and document for provider visits.  Alert provider if any consistent blood sugar <70.  Return in 6 months. - Needs eye exam.   Foot exam up to date. - ARB and statin on board - May get Pneumococcal vaccine next visit and flu.        Relevant Medications   losartan (COZAAR) 50 MG tablet   glipiZIDE (GLUCOTROL) 5 MG tablet   atorvastatin (LIPITOR) 40 MG tablet   Other Relevant Orders   Bayer DCA Hb A1c Waived   Microalbumin, Urine Waived     Other   Depression, recurrent (HCC)    Chronic, exacerbated by current living situation.  Denies SI/HI.  Discussed with patient her caregiver stressors and possible treatment options to include no medication, supplements, or SSRI/Buspar/SNRI.  Educated on all options.  She would like to trial SSRI.  Will start Zoloft 25 MG daily and adjust as needed.  Educated on side effects to report to provider and BLACK BOX warning.  Due to self pay, at this time return in 6 months -- however if no benefit or worsening to return sooner.  She agrees with this plan and prefers this.      Relevant Medications   sertraline (ZOLOFT) 25 MG tablet   Obesity    BMI 34.10. Recommended eating smaller high protein, low fat meals more frequently and exercising 30 mins a day 5 times a week with a goal of 10-15lb weight loss in the next 3 months. Patient voiced their understanding and motivation to adhere to these recommendations.       Relevant Medications   glipiZIDE (GLUCOTROL) 5 MG tablet   Vitamin D deficiency    Chronic, recheck level today and restart supplement as needed.      Relevant Orders   VITAMIN D 25 Hydroxy (Vit-D Deficiency, Fractures)     Follow up plan: Return in about 6 months (around 09/07/2023) for T2DM, HTN/HLD, MOOD, MIGRAINES.

## 2023-03-07 NOTE — Assessment & Plan Note (Signed)
Chronic, exacerbated by current living situation.  Denies SI/HI.  Discussed with patient her caregiver stressors and possible treatment options to include no medication, supplements, or SSRI/Buspar/SNRI.  Educated on all options.  She would like to trial SSRI.  Will start Zoloft 25 MG daily and adjust as needed.  Educated on side effects to report to provider and BLACK BOX warning.  Due to self pay, at this time return in 6 months -- however if no benefit or worsening to return sooner.  She agrees with this plan and prefers this.

## 2023-03-07 NOTE — Assessment & Plan Note (Signed)
BMI 34.10.  Recommended eating smaller high protein, low fat meals more frequently and exercising 30 mins a day 5 times a week with a goal of 10-15lb weight loss in the next 3 months. Patient voiced their understanding and motivation to adhere to these recommendations.

## 2023-03-07 NOTE — Assessment & Plan Note (Signed)
Chronic, ongoing.  BP at goal today with Losartan and tolerating well.  Recommend she monitor BP at least a few mornings a week at home and document.  DASH diet at home.  Continue current medication regimen and adjust as needed.  Labs today: CMP, TSH, lipid.  Return in 6 months.

## 2023-03-08 LAB — LIPID PANEL W/O CHOL/HDL RATIO
Cholesterol, Total: 138 mg/dL (ref 100–199)
HDL: 43 mg/dL (ref >39)
LDL Chol Calc (NIH): 73 mg/dL (ref 0–99)
Triglycerides: 125 mg/dL (ref 0–149)
VLDL Cholesterol Cal: 22 mg/dL (ref 5–40)

## 2023-03-08 LAB — CBC WITH DIFFERENTIAL/PLATELET
Basophils Absolute: 0.1 10*3/uL (ref 0.0–0.2)
Basos: 1 %
EOS (ABSOLUTE): 0.2 10*3/uL (ref 0.0–0.4)
Eos: 3 %
Hematocrit: 39.1 % (ref 34.0–46.6)
Hemoglobin: 13.4 g/dL (ref 11.1–15.9)
Immature Grans (Abs): 0 10*3/uL (ref 0.0–0.1)
Immature Granulocytes: 0 %
Lymphocytes Absolute: 2.7 10*3/uL (ref 0.7–3.1)
Lymphs: 34 %
MCH: 29.8 pg (ref 26.6–33.0)
MCHC: 34.3 g/dL (ref 31.5–35.7)
MCV: 87 fL (ref 79–97)
Monocytes Absolute: 0.5 10*3/uL (ref 0.1–0.9)
Monocytes: 6 %
Neutrophils Absolute: 4.6 10*3/uL (ref 1.4–7.0)
Neutrophils: 56 %
Platelets: 215 10*3/uL (ref 150–450)
RBC: 4.49 x10E6/uL (ref 3.77–5.28)
RDW: 12.3 % (ref 11.7–15.4)
WBC: 8 10*3/uL (ref 3.4–10.8)

## 2023-03-08 LAB — COMPREHENSIVE METABOLIC PANEL
ALT: 22 IU/L (ref 0–32)
AST: 14 IU/L (ref 0–40)
Albumin: 4.4 g/dL (ref 3.9–4.9)
Alkaline Phosphatase: 103 IU/L (ref 44–121)
BUN/Creatinine Ratio: 13 (ref 12–28)
BUN: 12 mg/dL (ref 8–27)
Bilirubin Total: 1 mg/dL (ref 0.0–1.2)
CO2: 19 mmol/L — ABNORMAL LOW (ref 20–29)
Calcium: 9.5 mg/dL (ref 8.7–10.3)
Chloride: 108 mmol/L — ABNORMAL HIGH (ref 96–106)
Creatinine, Ser: 0.91 mg/dL (ref 0.57–1.00)
Globulin, Total: 1.9 g/dL (ref 1.5–4.5)
Glucose: 120 mg/dL — ABNORMAL HIGH (ref 70–99)
Potassium: 4 mmol/L (ref 3.5–5.2)
Sodium: 143 mmol/L (ref 134–144)
Total Protein: 6.3 g/dL (ref 6.0–8.5)
eGFR: 71 mL/min/{1.73_m2} (ref >59)

## 2023-03-08 LAB — TSH: TSH: 1.88 u[IU]/mL (ref 0.450–4.500)

## 2023-03-08 LAB — VITAMIN D 25 HYDROXY (VIT D DEFICIENCY, FRACTURES): Vit D, 25-Hydroxy: 32 ng/mL (ref 30.0–100.0)

## 2023-03-08 NOTE — Progress Notes (Signed)
Contacted via MyChart   Good evening Kendra Espinoza, your labs have returned: - Kidney function, creatinine and eGFR, remains normal, as is liver function, AST and ALT.  - Cholesterol levels are much improved from last check, continue taking Atorvastatin 40 MG daily. - Remainder of labs all look great.  No changes needed.  Any questions? Keep being amazing!!  Thank you for allowing me to participate in your care.  I appreciate you. Kindest regards, Dashia Caldeira

## 2023-03-24 ENCOUNTER — Ambulatory Visit: Payer: Self-pay

## 2023-03-24 ENCOUNTER — Telehealth: Payer: Self-pay | Admitting: Physician Assistant

## 2023-03-24 DIAGNOSIS — U071 COVID-19: Secondary | ICD-10-CM

## 2023-03-24 MED ORDER — NIRMATRELVIR/RITONAVIR (PAXLOVID)TABLET: 3.0000 | ORAL_TABLET | Freq: Two times a day (BID) | ORAL | 0 refills | Status: AC

## 2023-03-24 MED ORDER — PROMETHAZINE-DM 6.25-15 MG/5ML PO SYRP
5.0000 mL | ORAL_SOLUTION | Freq: Four times a day (QID) | ORAL | 0 refills | Status: DC | PRN
Start: 2023-03-24 — End: 2024-02-12

## 2023-03-24 MED ORDER — BENZONATATE 100 MG PO CAPS: 100.0000 mg | ORAL_CAPSULE | Freq: Three times a day (TID) | ORAL | 0 refills | Status: DC | PRN

## 2023-03-24 MED ORDER — ONDANSETRON 4 MG PO TBDP
4.0000 mg | ORAL_TABLET | Freq: Three times a day (TID) | ORAL | 0 refills | Status: DC | PRN
Start: 2023-03-24 — End: 2024-02-12

## 2023-03-24 NOTE — Patient Instructions (Signed)
[name], thank you for joining Margaretann Loveless, PA-C for today's virtual visit.  While this provider is not your primary care provider (PCP), if your PCP is located in our provider database this encounter information will be shared with them immediately following your visit.   A [facility] MyChart account gives you access to today's visit and all your visits, tests, and labs performed at [facility] " click here if you don't have a [facility] MyChart account or go to [email or web address]  Consent: (Patient) Kendra Espinoza provided verbal consent for this virtual visit at the beginning of the encounter.  Current Medications:  Current Outpatient Medications:    benzonatate (TESSALON) 100 MG capsule, Take 1 capsule (100 mg total) by mouth 3 (three) times daily as needed., Disp: 30 capsule, Rfl: 0   nirmatrelvir/ritonavir (PAXLOVID) 20 x 150 MG & 10 x 100MG  TABS, Take 3 tablets by mouth 2 (two) times daily for 5 days. (Take nirmatrelvir 150 mg two tablets twice daily for 5 days and ritonavir 100 mg one tablet twice daily for 5 days) Patient GFR is 71, Disp: 30 tablet, Rfl: 0   ondansetron (ZOFRAN-ODT) 4 MG disintegrating tablet, Take 1 tablet (4 mg total) by mouth every 8 (eight) hours as needed., Disp: 20 tablet, Rfl: 0   promethazine-dextromethorphan (PROMETHAZINE-DM) 6.25-15 MG/5ML syrup, Take 5 mLs by mouth 4 (four) times daily as needed., Disp: 118 mL, Rfl: 0   atorvastatin (LIPITOR) 40 MG tablet, Take 1 tablet (40 mg total) by mouth daily., Disp: 90 tablet, Rfl: 1   glipiZIDE (GLUCOTROL) 5 MG tablet, Take 1 tablet (5 mg total) by mouth 2 (two) times daily before a meal., Disp: 180 tablet, Rfl: 1   losartan (COZAAR) 50 MG tablet, Take 1 tablet (50 mg total) by mouth daily., Disp: 90 tablet, Rfl: 1   sertraline (ZOLOFT) 25 MG tablet, Take 1 tablet (25 mg total) by mouth daily., Disp: 90 tablet, Rfl: 0   topiramate (TOPAMAX) 50 MG tablet, Take 1 tablet by mouth twice  daily, Disp: 180 tablet, Rfl: 1   Medications ordered in this encounter:  Meds ordered this encounter  Medications   nirmatrelvir/ritonavir (PAXLOVID) 20 x 150 MG & 10 x 100MG  TABS    Sig: Take 3 tablets by mouth 2 (two) times daily for 5 days. (Take nirmatrelvir 150 mg two tablets twice daily for 5 days and ritonavir 100 mg one tablet twice daily for 5 days) Patient GFR is 71    Dispense:  30 tablet    Refill:  0    Order Specific Question:   Supervising Provider    Answer:   Merrilee Jansky [[id number]]   benzonatate (TESSALON) 100 MG capsule    Sig: Take 1 capsule (100 mg total) by mouth 3 (three) times daily as needed.    Dispense:  30 capsule    Refill:  0    Order Specific Question:   Supervising Provider    Answer:   LAMPTEY, PHILIP O [[id number]]   ondansetron (ZOFRAN-ODT) 4 MG disintegrating tablet    Sig: Take 1 tablet (4 mg total) by mouth every 8 (eight) hours as needed.    Dispense:  20 tablet    Refill:  0    Order Specific Question:   Supervising Provider    Answer:   Merrilee Jansky [id number]   promethazine-dextromethorphan (PROMETHAZINE-DM) 6.25-15 MG/5ML syrup    Sig: Take 5 mLs by mouth 4 (four) times daily as needed.  Dispense:  118 mL    Refill:  0    Order Specific Question:   Supervising Provider    Answer:   Merrilee Jansky [[id number]]     *If you need refills on other medications prior to your next appointment, please contact your pharmacy*  Follow-Up: Call back or seek an in-person evaluation if the symptoms worsen or if the condition fails to improve as anticipated.  [facility] [phone]  Care Instructions: Can take to lessen severity: Vit C 500mg  twice daily Quercertin 250-500mg  twice daily Zinc 75-100mg  daily Melatonin 3-6 mg at bedtime Vit D3 1000-2000 IU daily Aspirin 81 mg daily with food Optional: Famotidine 20mg  daily Also can add tylenol/ibuprofen as needed for fevers and body aches May add Mucinex or Mucinex DM as  needed for cough/congestion    Isolation Instructions: You are to isolate at home until you have been fever free for at least 24 hours without a fever-reducing medication, and symptoms have been steadily improving for 24 hours. At that time,  you can end isolation but need to mask for an additional 5 days.   If you must be around other household members who do not have symptoms, you need to make sure that both you and the family members are masking consistently with a high-quality mask.  If you note any worsening of symptoms despite treatment, please seek an in-person evaluation ASAP. If you note any significant shortness of breath or any chest pain, please seek ER evaluation. Please do not delay care!   COVID-19: What to Do if You Are Sick If you test positive and are an older adult or someone who is at high risk of getting very sick from COVID-19, treatment may be available. Contact a healthcare provider right away after a positive test to determine if you are eligible, even if your symptoms are mild right now. You can also visit a Test to Treat location and, if eligible, receive a prescription from a provider. Don't delay: Treatment must be started within the first few days to be effective. If you have a fever, cough, or other symptoms, you might have COVID-19. Most people have mild illness and are able to recover at home. If you are sick: Keep track of your symptoms. If you have an emergency warning sign (including trouble breathing), call 911. Steps to help prevent the spread of COVID-19 if you are sick If you are sick with COVID-19 or think you might have COVID-19, follow the steps below to care for yourself and to help protect other people in your home and community. Stay home except to get medical care Stay home. Most people with COVID-19 have mild illness and can recover at home without medical care. Do not leave your home, except to get medical care. Do not visit public areas and do not  go to places where you are unable to wear a mask. Take care of yourself. Get rest and stay hydrated. Take over-the-counter medicines, such as acetaminophen, to help you feel better. Stay in touch with your doctor. Call before you get medical care. Be sure to get care if you have trouble breathing, or have any other emergency warning signs, or if you think it is an emergency. Avoid public transportation, ride-sharing, or taxis if possible. Get tested If you have symptoms of COVID-19, get tested. While waiting for test results, stay away from others, including staying apart from those living in your household. Get tested as soon as possible after your  symptoms start. Treatments may be available for people with COVID-19 who are at risk for becoming very sick. Don't delay: Treatment must be started early to be effective--some treatments must begin within 5 days of your first symptoms. Contact your healthcare provider right away if your test result is positive to determine if you are eligible. Self-tests are one of several options for testing for the virus that causes COVID-19 and may be more convenient than laboratory-based tests and point-of-care tests. Ask your healthcare provider or your local health department if you need help interpreting your test results. You can visit your state, tribal, local, and territorial health department's website to look for the latest local information on testing sites. Separate yourself from other people As much as possible, stay in a specific room and away from other people and pets in your home. If possible, you should use a separate bathroom. If you need to be around other people or animals in or outside of the home, wear a well-fitting mask. Tell your close contacts that they may have been exposed to COVID-19. An infected person can spread COVID-19 starting 48 hours (or 2 days) before the person has any symptoms or tests positive. By letting your close contacts know they  may have been exposed to COVID-19, you are helping to protect everyone. See COVID-19 and Animals if you have questions about pets. If you are diagnosed with COVID-19, someone from the health department may call you. Answer the call to slow the spread. Monitor your symptoms Symptoms of COVID-19 include fever, cough, or other symptoms. Follow care instructions from your healthcare provider and local health department. Your local health authorities may give instructions on checking your symptoms and reporting information. When to seek emergency medical attention Look for emergency warning signs* for COVID-19. If someone is showing any of these signs, seek emergency medical care immediately: Trouble breathing Persistent pain or pressure in the chest New confusion Inability to wake or stay awake Pale, gray, or blue-colored skin, lips, or nail beds, depending on skin tone *This list is not all possible symptoms. Please call your medical provider for any other symptoms that are severe or concerning to you. Call 911 or call ahead to your local emergency facility: Notify the operator that you are seeking care for someone who has or may have COVID-19. Call ahead before visiting your doctor Call ahead. Many medical visits for routine care are being postponed or done by phone or telemedicine. If you have a medical appointment that cannot be postponed, call your doctor's office, and tell them you have or may have COVID-19. This will help the office protect themselves and other patients. If you are sick, wear a well-fitting mask You should wear a mask if you must be around other people or animals, including pets (even at home). Wear a mask with the best fit, protection, and comfort for you. You don't need to wear the mask if you are alone. If you can't put on a mask (because of trouble breathing, for example), cover your coughs and sneezes in some other way. Try to stay at least 6 feet away from other people.  This will help protect the people around you. Masks should not be placed on young children under age 49 years, anyone who has trouble breathing, or anyone who is not able to remove the mask without help. Cover your coughs and sneezes Cover your mouth and nose with a tissue when you cough or sneeze. Throw away used tissues in a lined  trash can. Immediately wash your hands with soap and water for at least 20 seconds. If soap and water are not available, clean your hands with an alcohol-based hand sanitizer that contains at least 60% alcohol. Clean your hands often Wash your hands often with soap and water for at least 20 seconds. This is especially important after blowing your nose, coughing, or sneezing; going to the bathroom; and before eating or preparing food. Use hand sanitizer if soap and water are not available. Use an alcohol-based hand sanitizer with at least 60% alcohol, covering all surfaces of your hands and rubbing them together until they feel dry. Soap and water are the best option, especially if hands are visibly dirty. Avoid touching your eyes, nose, and mouth with unwashed hands. Handwashing Tips Avoid sharing personal household items Do not share dishes, drinking glasses, cups, eating utensils, towels, or bedding with other people in your home. Wash these items thoroughly after using them with soap and water or put in the dishwasher. Clean surfaces in your home regularly Clean and disinfect high-touch surfaces (for example, doorknobs, tables, handles, light switches, and countertops) in your "sick room" and bathroom. In shared spaces, you should clean and disinfect surfaces and items after each use by the person who is ill. If you are sick and cannot clean, a caregiver or other person should only clean and disinfect the area around you (such as your bedroom and bathroom) on an as needed basis. Your caregiver/other person should wait as long as possible (at least several hours) and  wear a mask before entering, cleaning, and disinfecting shared spaces that you use. Clean and disinfect areas that may have blood, stool, or body fluids on them. Use household cleaners and disinfectants. Clean visible dirty surfaces with household cleaners containing soap or detergent. Then, use a household disinfectant. Use a product from [employer] List N: Disinfectants for Coronavirus (COVID-19). Be sure to follow the instructions on the label to ensure safe and effective use of the product. Many products recommend keeping the surface wet with a disinfectant for a certain period of time (look at "contact time" on the product label). You may also need to wear personal protective equipment, such as gloves, depending on the directions on the product label. Immediately after disinfecting, wash your hands with soap and water for 20 seconds. For completed guidance on cleaning and disinfecting your home, visit Complete Disinfection Guidance. Take steps to improve ventilation at home Improve ventilation (air flow) at home to help prevent from spreading COVID-19 to other people in your household. Clear out COVID-19 virus particles in the air by opening windows, using air filters, and turning on fans in your home. Use this interactive tool to learn how to improve air flow in your home. When you can be around others after being sick with COVID-19 Deciding when you can be around others is different for different situations. Find out when you can safely end home isolation. For any additional questions about your care, contact your healthcare provider or state or local health department. 10/06/2020 Content source: [facility] ([facility]), Division of Viral Diseases This information is not intended to replace advice given to you by your health care provider. Make sure you discuss any questions you have with your health care provider. Document Revised: 11/19/2020 Document  Reviewed: 11/19/2020 Elsevier Patient Education  2022 [employer].     If you have been instructed to have an in-person evaluation today at a local Urgent Care facility,  please use the link below. It will take you to a list of all of our available [facility], including address, phone number and hours of operation. Please do not delay care.  [facility]  If you or a family member do not have a primary care provider, use the link below to schedule a visit and establish care. When you choose a [facility] primary care physician or advanced practice provider, you gain a long-term partner in health. Find a Primary Care Provider  Learn more about [facility] in-office and virtual care options: [facility] - Get Care Now

## 2023-03-24 NOTE — Telephone Encounter (Signed)
  Chief Complaint: COVID + Symptoms: Cough, congestion, BA, Nausea, fever has broken Frequency: yesterday Pertinent Negatives: Patient denies  Disposition: [] ED /[] Urgent Care (no appt availability in office) / [] Appointment(In office/virtual)/ [x]  [facility]/ [] Home Care/ [] Refused Recommended Disposition /[] [facility]/ []  Follow-up with PCP Additional Notes: Pt took home COVID test after her husband tested +. Pt started s/s yesterday. Pt is interested in antivirals. No appts in office or virtually. [facility] scheduled.    Summary: cold and flu like symptoms / rx req   The patient has tested positive for COVID 19 via an at home test  The patient is experiencing a cough, congestion, body aches and nausea  The patient would like to be prescribed something for their symptoms  Please contact further when possible     Reason for Disposition  MILD difficulty breathing (e.g., minimal/no SOB at rest, SOB with walking, pulse <100)  Answer Assessment - Initial Assessment Questions 1. COVID-19 DIAGNOSIS: "How do you know that you have COVID?" (e.g., positive lab test or self-test, diagnosed by doctor or NP/PA, symptoms after exposure).     Home test 2. COVID-19 EXPOSURE: "Was there any known exposure to COVID before the symptoms began?" CDC Definition of close contact: within 6 feet (2 meters) for a total of 15 minutes or more over a 24-hour period.      Husband 3. ONSET: "When did the COVID-19 symptoms start?"      yesterday 4. WORST SYMPTOM: "What is your worst symptom?" (e.g., cough, fever, shortness of breath, muscle aches)     Throat, runny nose and cough 5. COUGH: "Do you have a cough?" If Yes, ask: "How bad is the cough?"       9/10 6. FEVER: "Do you have a fever?" If Yes, ask: "What is your temperature, how was it measured, and when did it start?"     yesterday 7. RESPIRATORY STATUS: "Describe your breathing?" (e.g., normal; shortness of breath, wheezing,  unable to speak)      Just when coughing 8. BETTER-SAME-WORSE: "Are you getting better, staying the same or getting worse compared to yesterday?"  If getting worse, ask, "In what way?"     worse 9. OTHER SYMPTOMS: "Do you have any other symptoms?"  (e.g., chills, fatigue, headache, loss of smell or taste, muscle pain, sore throat)     Cough, congestion, nausea, BA 10. HIGH RISK DISEASE: "Do you have any chronic medical problems?" (e.g., asthma, heart or lung disease, weak immune system, obesity, etc.)       no  Protocols used: Coronavirus (COVID-19) Diagnosed or Suspected-A-AH

## 2023-03-24 NOTE — Progress Notes (Signed)
Virtual Visit Consent   Kendra Espinoza, you are scheduled for a virtual visit with a [facility] provider today. Just as with appointments in the office, your consent must be obtained to participate. Your consent will be active for this visit and any virtual visit you may have with one of our providers in the next 365 days. If you have a MyChart account, a copy of this consent can be sent to you electronically.  As this is a virtual visit, video technology does not allow for your provider to perform a traditional examination. This may limit your provider's ability to fully assess your condition. If your provider identifies any concerns that need to be evaluated in person or the need to arrange testing (such as labs, EKG, etc.), we will make arrangements to do so. Although advances in technology are sophisticated, we cannot ensure that it will always work on either your end or our end. If the connection with a video visit is poor, the visit may have to be switched to a telephone visit. With either a video or telephone visit, we are not always able to ensure that we have a secure connection.  By engaging in this virtual visit, you consent to the provision of healthcare and authorize for your insurance to be billed (if applicable) for the services provided during this visit. Depending on your insurance coverage, you may receive a charge related to this service.  I need to obtain your verbal consent now. Are you willing to proceed with your visit today? HELMA ACHEN has provided verbal consent on 03/24/2023 for a virtual visit (video or telephone). Margaretann Loveless, PA-C  Date: 03/24/2023 4:05 PM  Virtual Visit via Video Note   I, Margaretann Loveless, connected with  Kendra Espinoza  ([id number], 08-01-1960) on 03/24/23 at  4:00 PM EDT by a video-enabled telemedicine application and verified that I am speaking with the correct person using two identifiers.  Location: Patient: Virtual Visit Location  Patient: Home Provider: Virtual Visit Location Provider: Home Office   I discussed the limitations of evaluation and management by telemedicine and the availability of in person appointments. The patient expressed understanding and agreed to proceed.    History of Present Illness: Kendra Espinoza is a 62 y.o. who identifies as a female who was assigned female at birth, and is being seen today for Covid 73.  HPI: URI  This is a new problem. The current episode started yesterday (Tested positive on at home test today; symptoms started yesterday). The problem has been gradually worsening. The maximum temperature recorded prior to her arrival was 101 - 101.9 F. The fever has been present for Less than 1 day. Associated symptoms include congestion, coughing, diarrhea, ear pain (left side), headaches, nausea, rhinorrhea, sinus pain (left), a sore throat (left side) and vomiting. Associated symptoms comments: Myalgias, chills. She has tried acetaminophen (dayquil, nyquil, vicks shower steamer, vicks stick rubbed on chest) for the symptoms. The treatment provided no relief.     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   Hyperlipidemia associated with type 2 diabetes mellitus (HCC) 07/26/2021   Type 2 diabetes mellitus with obesity (HCC) 06/14/2021   Vitamin D deficiency 10/02/2020   Hypertension associated with diabetes (HCC) 05/22/2020   Chronic diarrhea 01/06/2020   Polyp of colon    Depression, recurrent (HCC) 04/10/2019   Obesity 03/15/2019   Difficulty walking 09/17/2018  Migraine 07/31/2018   Insomnia 06/24/2018    Allergies:  Allergies  Allergen Reactions   Codeine     Dizziness   Medications:  Current Outpatient Medications:    benzonatate (TESSALON) 100 MG capsule, Take 1 capsule (100 mg total) by mouth 3 (three) times daily as needed., Disp: 30 capsule, Rfl: 0   nirmatrelvir/ritonavir (PAXLOVID)  20 x 150 MG & 10 x 100MG  TABS, Take 3 tablets by mouth 2 (two) times daily for 5 days. (Take nirmatrelvir 150 mg two tablets twice daily for 5 days and ritonavir 100 mg one tablet twice daily for 5 days) Patient GFR is 71, Disp: 30 tablet, Rfl: 0   ondansetron (ZOFRAN-ODT) 4 MG disintegrating tablet, Take 1 tablet (4 mg total) by mouth every 8 (eight) hours as needed., Disp: 20 tablet, Rfl: 0   promethazine-dextromethorphan (PROMETHAZINE-DM) 6.25-15 MG/5ML syrup, Take 5 mLs by mouth 4 (four) times daily as needed., Disp: 118 mL, Rfl: 0   atorvastatin (LIPITOR) 40 MG tablet, Take 1 tablet (40 mg total) by mouth daily., Disp: 90 tablet, Rfl: 1   glipiZIDE (GLUCOTROL) 5 MG tablet, Take 1 tablet (5 mg total) by mouth 2 (two) times daily before a meal., Disp: 180 tablet, Rfl: 1   losartan (COZAAR) 50 MG tablet, Take 1 tablet (50 mg total) by mouth daily., Disp: 90 tablet, Rfl: 1   sertraline (ZOLOFT) 25 MG tablet, Take 1 tablet (25 mg total) by mouth daily., Disp: 90 tablet, Rfl: 0   topiramate (TOPAMAX) 50 MG tablet, Take 1 tablet by mouth twice daily, Disp: 180 tablet, Rfl: 1  Observations/Objective: Patient is well-developed, well-nourished in no acute distress.  Resting comfortably at home.  Head is normocephalic, atraumatic.  No labored breathing.  Speech is clear and coherent with logical content.  Patient is alert and oriented at baseline.    Assessment and Plan: 1. COVID-19 - nirmatrelvir/ritonavir (PAXLOVID) 20 x 150 MG & 10 x 100MG  TABS; Take 3 tablets by mouth 2 (two) times daily for 5 days. (Take nirmatrelvir 150 mg two tablets twice daily for 5 days and ritonavir 100 mg one tablet twice daily for 5 days) Patient GFR is 71  Dispense: 30 tablet; Refill: 0 - MyChart COVID-19 home monitoring program; Future - benzonatate (TESSALON) 100 MG capsule; Take 1 capsule (100 mg total) by mouth 3 (three) times daily as needed.  Dispense: 30 capsule; Refill: 0 - ondansetron (ZOFRAN-ODT) 4 MG  disintegrating tablet; Take 1 tablet (4 mg total) by mouth every 8 (eight) hours as needed.  Dispense: 20 tablet; Refill: 0 - promethazine-dextromethorphan (PROMETHAZINE-DM) 6.25-15 MG/5ML syrup; Take 5 mLs by mouth 4 (four) times daily as needed.  Dispense: 118 mL; Refill: 0  - Continue OTC symptomatic management of choice - Will send OTC vitamins and supplement information through AVS - Paxlovid prescribed - Tessalon perles and Promethazine DM for cough - Zofran for nausea - Patient enrolled in MyChart symptom monitoring - Push fluids - Rest as needed - Discussed return precautions and when to seek in-person evaluation, sent via AVS as well   Follow Up Instructions: I discussed the assessment and treatment plan with the patient. The patient was provided an opportunity to ask questions and all were answered. The patient agreed with the plan and demonstrated an understanding of the instructions.  A copy of instructions were sent to the patient via MyChart unless otherwise noted below.    The patient was advised to call back or seek an in-person evaluation if the symptoms worsen  or if the condition fails to improve as anticipated.  Time:  I spent 10 minutes with the patient via telehealth technology discussing the above problems/concerns.    Margaretann Loveless, PA-C

## 2023-05-30 ENCOUNTER — Other Ambulatory Visit: Payer: Self-pay | Admitting: Nurse Practitioner

## 2023-05-31 NOTE — Telephone Encounter (Signed)
Requested Prescriptions  Pending Prescriptions Disp Refills   sertraline (ZOLOFT) 25 MG tablet [Pharmacy Med Name: Sertraline HCl 25 MG Oral Tablet] 90 tablet 0    Sig: Take 1 tablet by mouth once daily     Psychiatry:  Antidepressants - SSRI - sertraline Passed - 05/30/2023  9:16 AM      Passed - AST in normal range and within 360 days    AST  Date Value Ref Range Status  03/07/2023 14 0 - 40 IU/L Final         Passed - ALT in normal range and within 360 days    ALT  Date Value Ref Range Status  03/07/2023 22 0 - 32 IU/L Final         Passed - Completed PHQ-2 or PHQ-9 in the last 360 days      Passed - Valid encounter within last 6 months    Recent Outpatient Visits           2 months ago Type 2 diabetes mellitus with obesity (HCC)   [place] [facility] [place], Corrie Dandy T, NP   9 months ago Primary hypertension   [place] [facility] Larae Grooms, NP   1 year ago Type 2 diabetes with complication [facility])   [place] [facility] Larae Grooms, NP   1 year ago Depression, recurrent (HCC)   [place] [facility] Vigg, Avanti, MD   1 year ago Diabetes mellitus without complication [facility])   [place] [facility] Vigg, Avanti, MD       Future Appointments             In 3 months Larae Grooms, NP  [facility]

## 2023-09-07 ENCOUNTER — Ambulatory Visit: Payer: Self-pay | Admitting: Nurse Practitioner

## 2023-09-11 ENCOUNTER — Ambulatory Visit: Payer: Self-pay | Admitting: Nurse Practitioner

## 2023-09-21 ENCOUNTER — Other Ambulatory Visit: Payer: Self-pay | Admitting: Nurse Practitioner

## 2023-09-21 DIAGNOSIS — E1159 Type 2 diabetes mellitus with other circulatory complications: Secondary | ICD-10-CM

## 2023-09-22 ENCOUNTER — Other Ambulatory Visit: Payer: Self-pay | Admitting: Nurse Practitioner

## 2023-09-22 DIAGNOSIS — E1159 Type 2 diabetes mellitus with other circulatory complications: Secondary | ICD-10-CM

## 2023-09-22 NOTE — Telephone Encounter (Signed)
 Called pt to schedule an office visit. Call cannot be completed as dialed.

## 2023-09-22 NOTE — Telephone Encounter (Signed)
 Requested medications are due for refill today.  yes  Requested medications are on the active medications list.  yes  Last refill. 03/07/2023 #90 1 rf  Future visit scheduled.   no  Notes to clinic.  Called pt - call could not be completed as dialed. Unable to schedule appt.    Requested Prescriptions  Pending Prescriptions Disp Refills   losartan (COZAAR) 50 MG tablet [Pharmacy Med Name: Losartan Potassium 50 MG Oral Tablet] 90 tablet 0    Sig: Take 1 tablet by mouth once daily     Cardiovascular:  Angiotensin Receptor Blockers Failed - 09/22/2023  2:53 PM      Failed - Cr in normal range and within 180 days    Creatinine, Ser  Date Value Ref Range Status  03/07/2023 0.91 0.57 - 1.00 mg/dL Final         Failed - K in normal range and within 180 days    Potassium  Date Value Ref Range Status  03/07/2023 4.0 3.5 - 5.2 mmol/L Final         Failed - Valid encounter within last 6 months    Recent Outpatient Visits           6 months ago Type 2 diabetes mellitus with obesity (HCC)   [place] [facility] [place], Corrie Dandy T, NP   1 year ago Primary hypertension   [place] [facility] Larae Grooms, NP   1 year ago Type 2 diabetes with complication [facility])   [place] [facility] Larae Grooms, NP   1 year ago Depression, recurrent (HCC)   [place] [facility] Vigg, Avanti, MD   2 years ago Diabetes mellitus without complication [facility])   [place] [facility] Vigg, Avanti, MD              Passed - Patient is not pregnant      Passed - Last BP in normal range    BP Readings from Last 1 Encounters:  03/07/23 119/76

## 2023-10-05 ENCOUNTER — Ambulatory Visit: Payer: Self-pay | Admitting: Nurse Practitioner

## 2023-10-20 ENCOUNTER — Ambulatory Visit: Payer: Self-pay | Admitting: Nurse Practitioner

## 2023-10-21 ENCOUNTER — Other Ambulatory Visit: Payer: Self-pay | Admitting: Nurse Practitioner

## 2023-10-21 DIAGNOSIS — I152 Hypertension secondary to endocrine disorders: Secondary | ICD-10-CM

## 2023-10-23 NOTE — Telephone Encounter (Signed)
 Appointment 11/10/23-will refill Requested Prescriptions  Pending Prescriptions Disp Refills   losartan (COZAAR) 50 MG tablet [Pharmacy Med Name: Losartan Potassium 50 MG Oral Tablet] 30 tablet 0    Sig: Take 1 tablet by mouth once daily     Cardiovascular:  Angiotensin Receptor Blockers Failed - 10/23/2023 12:20 PM      Failed - Cr in normal range and within 180 days    Creatinine, Ser  Date Value Ref Range Status  03/07/2023 0.91 0.57 - 1.00 mg/dL Final         Failed - K in normal range and within 180 days    Potassium  Date Value Ref Range Status  03/07/2023 4.0 3.5 - 5.2 mmol/L Final         Failed - Valid encounter within last 6 months    Recent Outpatient Visits   None            Passed - Patient is not pregnant      Passed - Last BP in normal range    BP Readings from Last 1 Encounters:  03/07/23 119/76

## 2023-11-09 ENCOUNTER — Other Ambulatory Visit: Payer: Self-pay | Admitting: Nurse Practitioner

## 2023-11-10 ENCOUNTER — Ambulatory Visit: Payer: Self-pay | Admitting: Nurse Practitioner

## 2023-11-10 NOTE — Telephone Encounter (Signed)
 Requested Prescriptions  Pending Prescriptions Disp Refills   glipiZIDE  (GLUCOTROL ) 5 MG tablet [Pharmacy Med Name: glipiZIDE  5 MG Oral Tablet] 180 tablet 0    Sig: TAKE 1 TABLET BY MOUTH TWICE DAILY BEFORE A MEAL     Endocrinology:  Diabetes - Sulfonylureas Failed - 11/10/2023  9:10 AM      Failed - HBA1C is between 0 and 7.9 and within 180 days    HB A1C (BAYER DCA - WAIVED)  Date Value Ref Range Status  03/07/2023 6.2 (H) 4.8 - 5.6 % Final    Comment:             Prediabetes: 5.7 - 6.4          Diabetes: >6.4          Glycemic control for adults with diabetes: <7.0          Failed - Valid encounter within last 6 months    Recent Outpatient Visits   None            Passed - Cr in normal range and within 360 days    Creatinine, Ser  Date Value Ref Range Status  03/07/2023 0.91 0.57 - 1.00 mg/dL Final

## 2023-11-18 ENCOUNTER — Other Ambulatory Visit: Payer: Self-pay | Admitting: Nurse Practitioner

## 2023-11-18 DIAGNOSIS — I152 Hypertension secondary to endocrine disorders: Secondary | ICD-10-CM

## 2023-11-21 NOTE — Telephone Encounter (Signed)
 Requested medications are due for refill today.  yes  Requested medications are on the active medications list.  yes  Last refill. 10/23/2023 #30 0 rf - a courtesy refill  Future visit scheduled.   yes  Notes to clinic.  Pt last seen 03/07/2023 - Pt has cancelled several appts.     Requested Prescriptions  Pending Prescriptions Disp Refills   losartan  (COZAAR ) 50 MG tablet [Pharmacy Med Name: Losartan  Potassium 50 MG Oral Tablet] 30 tablet 0    Sig: Take 1 tablet by mouth once daily     Cardiovascular:  Angiotensin Receptor Blockers Failed - 11/21/2023 11:15 AM      Failed - Cr in normal range and within 180 days    Creatinine, Ser  Date Value Ref Range Status  03/07/2023 0.91 0.57 - 1.00 mg/dL Final         Failed - K in normal range and within 180 days    Potassium  Date Value Ref Range Status  03/07/2023 4.0 3.5 - 5.2 mmol/L Final         Failed - Valid encounter within last 6 months    Recent Outpatient Visits   None            Passed - Patient is not pregnant      Passed - Last BP in normal range    BP Readings from Last 1 Encounters:  03/07/23 119/76

## 2023-11-21 NOTE — Telephone Encounter (Signed)
 Patient needs refill before appointment next week.

## 2023-11-28 ENCOUNTER — Ambulatory Visit: Payer: Self-pay | Admitting: Nurse Practitioner

## 2023-12-14 ENCOUNTER — Other Ambulatory Visit: Payer: Self-pay | Admitting: Nurse Practitioner

## 2023-12-15 NOTE — Telephone Encounter (Signed)
 Requested Prescriptions  Pending Prescriptions Disp Refills   atorvastatin  (LIPITOR) 40 MG tablet [Pharmacy Med Name: Atorvastatin  Calcium  40 MG Oral Tablet] 90 tablet 0    Sig: Take 1 tablet by mouth once daily     Cardiovascular:  Antilipid - Statins Failed - 12/15/2023  5:00 PM      Failed - Valid encounter within last 12 months    Recent Outpatient Visits   None            Failed - Lipid Panel in normal range within the last 12 months    Cholesterol, Total  Date Value Ref Range Status  03/07/2023 138 100 - 199 mg/dL Final   LDL Chol Calc (NIH)  Date Value Ref Range Status  03/07/2023 73 0 - 99 mg/dL Final   HDL  Date Value Ref Range Status  03/07/2023 43 >39 mg/dL Final   Triglycerides  Date Value Ref Range Status  03/07/2023 125 0 - 149 mg/dL Final         Passed - Patient is not pregnant

## 2023-12-20 ENCOUNTER — Ambulatory Visit: Payer: Self-pay | Admitting: Nurse Practitioner

## 2023-12-21 ENCOUNTER — Other Ambulatory Visit: Payer: Self-pay | Admitting: Nurse Practitioner

## 2023-12-21 DIAGNOSIS — E1159 Type 2 diabetes mellitus with other circulatory complications: Secondary | ICD-10-CM

## 2023-12-22 NOTE — Telephone Encounter (Signed)
 Appt- 6/19- courtesy refill given Requested Prescriptions  Pending Prescriptions Disp Refills   losartan  (COZAAR ) 50 MG tablet [Pharmacy Med Name: Losartan  Potassium 50 MG Oral Tablet] 30 tablet 0    Sig: Take 1 tablet by mouth once daily     Cardiovascular:  Angiotensin Receptor Blockers Failed - 12/22/2023 10:36 AM      Failed - Cr in normal range and within 180 days    Creatinine, Ser  Date Value Ref Range Status  03/07/2023 0.91 0.57 - 1.00 mg/dL Final         Failed - K in normal range and within 180 days    Potassium  Date Value Ref Range Status  03/07/2023 4.0 3.5 - 5.2 mmol/L Final         Failed - Valid encounter within last 6 months    Recent Outpatient Visits   None            Passed - Patient is not pregnant      Passed - Last BP in normal range    BP Readings from Last 1 Encounters:  03/07/23 119/76

## 2024-01-04 ENCOUNTER — Ambulatory Visit: Payer: Self-pay | Admitting: Nurse Practitioner

## 2024-01-15 ENCOUNTER — Ambulatory Visit: Payer: Self-pay | Admitting: Nurse Practitioner

## 2024-01-22 ENCOUNTER — Other Ambulatory Visit: Payer: Self-pay | Admitting: Nurse Practitioner

## 2024-01-22 DIAGNOSIS — E1159 Type 2 diabetes mellitus with other circulatory complications: Secondary | ICD-10-CM

## 2024-01-23 NOTE — Telephone Encounter (Signed)
 Requested medications are due for refill today.  yes  Requested medications are on the active medications list.  yes  Last refill. 12/22/2023 #30 0 rf  Future visit scheduled.   yes  Notes to clinic.  Labs are expired. Pt has missed several scheduled ov appts.    Requested Prescriptions  Pending Prescriptions Disp Refills   losartan  (COZAAR ) 50 MG tablet [Pharmacy Med Name: Losartan  Potassium 50 MG Oral Tablet] 30 tablet 0    Sig: Take 1 tablet by mouth once daily     Cardiovascular:  Angiotensin Receptor Blockers Failed - 01/23/2024  4:26 PM      Failed - Cr in normal range and within 180 days    Creatinine, Ser  Date Value Ref Range Status  03/07/2023 0.91 0.57 - 1.00 mg/dL Final         Failed - K in normal range and within 180 days    Potassium  Date Value Ref Range Status  03/07/2023 4.0 3.5 - 5.2 mmol/L Final         Failed - Valid encounter within last 6 months    Recent Outpatient Visits   None            Passed - Patient is not pregnant      Passed - Last BP in normal range    BP Readings from Last 1 Encounters:  03/07/23 119/76

## 2024-01-23 NOTE — Telephone Encounter (Signed)
 Patient will be out before upcoming appointment.

## 2024-01-25 ENCOUNTER — Ambulatory Visit: Payer: Self-pay | Admitting: Nurse Practitioner

## 2024-02-12 ENCOUNTER — Encounter: Payer: Self-pay | Admitting: Nurse Practitioner

## 2024-02-12 ENCOUNTER — Ambulatory Visit (INDEPENDENT_AMBULATORY_CARE_PROVIDER_SITE_OTHER): Payer: Self-pay | Admitting: Nurse Practitioner

## 2024-02-12 VITALS — BP 121/77 | HR 99 | Temp 97.6°F | Ht 64.5 in | Wt 214.8 lb

## 2024-02-12 DIAGNOSIS — E119 Type 2 diabetes mellitus without complications: Secondary | ICD-10-CM | POA: Insufficient documentation

## 2024-02-12 DIAGNOSIS — E1169 Type 2 diabetes mellitus with other specified complication: Secondary | ICD-10-CM

## 2024-02-12 DIAGNOSIS — G43909 Migraine, unspecified, not intractable, without status migrainosus: Secondary | ICD-10-CM

## 2024-02-12 DIAGNOSIS — E669 Obesity, unspecified: Secondary | ICD-10-CM

## 2024-02-12 DIAGNOSIS — F339 Major depressive disorder, recurrent, unspecified: Secondary | ICD-10-CM

## 2024-02-12 DIAGNOSIS — I152 Hypertension secondary to endocrine disorders: Secondary | ICD-10-CM

## 2024-02-12 DIAGNOSIS — E785 Hyperlipidemia, unspecified: Secondary | ICD-10-CM

## 2024-02-12 DIAGNOSIS — Z7984 Long term (current) use of oral hypoglycemic drugs: Secondary | ICD-10-CM

## 2024-02-12 DIAGNOSIS — E1159 Type 2 diabetes mellitus with other circulatory complications: Secondary | ICD-10-CM

## 2024-02-12 LAB — MICROALBUMIN, URINE WAIVED
Creatinine, Urine Waived: 100 mg/dL (ref 10–300)
Microalb, Ur Waived: 80 mg/L — ABNORMAL HIGH (ref 0–19)

## 2024-02-12 MED ORDER — GLIPIZIDE 5 MG PO TABS: 5.0000 mg | ORAL_TABLET | Freq: Two times a day (BID) | ORAL | 1 refills | Status: AC

## 2024-02-12 MED ORDER — TOPIRAMATE 50 MG PO TABS: 50.0000 mg | ORAL_TABLET | Freq: Two times a day (BID) | ORAL | 1 refills | Status: AC

## 2024-02-12 MED ORDER — LOSARTAN POTASSIUM 50 MG PO TABS
50.0000 mg | ORAL_TABLET | Freq: Every day | ORAL | 1 refills | Status: AC
Start: 2024-02-12 — End: ?

## 2024-02-12 MED ORDER — ATORVASTATIN CALCIUM 40 MG PO TABS: 40.0000 mg | ORAL_TABLET | Freq: Every day | ORAL | 1 refills | Status: AC

## 2024-02-12 NOTE — Assessment & Plan Note (Signed)
 Chronic, ongoing.  BP at goal today with Losartan  and tolerating well.  Recommend she monitor BP at least a few mornings a week at home and document.  DASH diet at home.  Continue current medication regimen and adjust as needed.  Labs today: CMP, lipid.  Return in 6 months.

## 2024-02-12 NOTE — Assessment & Plan Note (Addendum)
 Chronic, exacerbated by current living situation.  Feels like the Topamax  is helping with her symptoms.

## 2024-02-12 NOTE — Assessment & Plan Note (Signed)
 Chronic, stable.  Will continue Topamax , which offers her benefit.  Controlled with current regimen.

## 2024-02-12 NOTE — Assessment & Plan Note (Signed)
 Chronic, stable with A1c remaining at goal today = 6.2% and urine ALB 17 March 2023.  At this time continue Glipizide  due to cost effectiveness and overall benefit to sugar levels.  Recommend she check blood sugar at least weekly and document for provider visits.  Alert provider if any consistent blood sugar <70.  Return in 6 months. - Needs eye exam.  Foot exam up to date. - ARB and statin on board

## 2024-02-12 NOTE — Progress Notes (Signed)
 BP 121/77   Pulse 99   Temp 97.6 F (36.4 C) (Oral)   Ht 5' 4.5 (1.638 m)   Wt 214 lb 12.8 oz (97.4 kg)   LMP 05/09/2014 (Approximate)   SpO2 98%   BMI 36.30 kg/m    Subjective:    Patient ID: Kendra Espinoza, female    DOB: [birth year], 63 y.o.   MRN: [id number]  HPI: Kendra Espinoza is a 63 y.o. female  Chief Complaint  Patient presents with   Depression   Diabetes   Hypertension   DIABETES Last A1c 6.2% in August 2024. On Glipizide  5 MG BID.  Hypoglycemic episodes:no Polydipsia/polyuria: no Visual disturbance: no Chest pain: no Paresthesias: no Glucose Monitoring: no  Accucheck frequency: Not Checking  Fasting glucose:  Post prandial:  Evening:  Before meals: Taking Insulin?: no  Long acting insulin:  Short acting insulin: Blood Pressure Monitoring: not checking Retinal Examination: Not up to Date Foot Exam: Up to Date Pneumovax: Up to Date Influenza: Up to Date Aspirin: no   HYPERTENSION / HYPERLIPIDEMIA Taking Atorvastatin  and Losartan . Satisfied with current treatment? yes Duration of hypertension: chronic BP monitoring frequency: not checking BP range:  BP medication side effects: no Duration of hyperlipidemia: chronic Cholesterol medication side effects: no Cholesterol supplements: none Medication compliance: good compliance Aspirin: no Recent stressors: no Recurrent headaches: yes- had a fall on Friday  Visual changes: no Palpitations: no Dyspnea: no Chest pain: no Lower extremity edema: no Dizzy/lightheaded: no   DEPRESSION & MIGRAINES Taking Topamax  50 MG BID for migraines and mood. Her mom just died which has been hard for her.  But her migraines are much better than they were before.  Mood status: controlled Satisfied with current treatment?: yes Symptom severity: moderate  Duration of current treatment : chronic Side effects: no Medication compliance: good compliance Psychotherapy/counseling: none Depressed mood: yes Anxious  mood: yes Anhedonia: yes Significant weight loss or gain: no Insomnia: yes hard to fall asleep Fatigue: yes Feelings of worthlessness or guilt: yes -- guilt only Impaired concentration/indecisiveness: no Suicidal ideations: no Hopelessness: no Crying spells: yes    02/12/2024    1:03 PM 03/07/2023    1:12 PM 08/16/2022   10:05 AM 05/19/2022    1:55 PM 10/04/2021    9:33 AM  Depression screen PHQ 2/9  Decreased Interest 3 2 2 1 2   Down, Depressed, Hopeless 3 2 1 1 2   PHQ - 2 Score 6 4 3 2 4   Altered sleeping 3 3 2 2 3   Tired, decreased energy 3 3 2 2 3   Change in appetite 3 3 2 2 3   Feeling bad or failure about yourself  1 0 1 0 2  Trouble concentrating 3 2 2 1 3   Moving slowly or fidgety/restless 3 2 1  0 2  Suicidal thoughts 0 0 0 0 0  PHQ-9 Score 22 17 13 9 20   Difficult doing work/chores Extremely dIfficult Somewhat difficult Somewhat difficult Somewhat difficult Very difficult       02/12/2024    1:05 PM 03/07/2023    1:12 PM 08/16/2022   10:06 AM 05/19/2022    1:55 PM  GAD 7 : Generalized Anxiety Score  Nervous, Anxious, on Edge 3 3 1 1   Control/stop worrying 3 2 1 1   Worry too much - different things 3 2 1 1   Trouble relaxing 3 2 1 1   Restless 3 2 1 1   Easily annoyed or irritable 3 3 1 1   Afraid -  awful might happen 1 2 1 1   Total GAD 7 Score 19 16 7 7   Anxiety Difficulty Very difficult Very difficult Somewhat difficult Somewhat difficult   Relevant past medical, surgical, family and social history reviewed and updated as indicated. Interim medical history since our last visit reviewed. Allergies and medications reviewed and updated.  Review of Systems  Constitutional:  Negative for activity change, appetite change, diaphoresis, fatigue and fever.  Respiratory:  Negative for cough, chest tightness and shortness of breath.   Cardiovascular:  Negative for chest pain, palpitations and leg swelling.  Gastrointestinal: Negative.   Endocrine: Negative for polydipsia,  polyphagia and polyuria.  Neurological:  Positive for headaches. Negative for dizziness, syncope, weakness, light-headedness and numbness.  Psychiatric/Behavioral:  Positive for sleep disturbance. Negative for decreased concentration, self-injury and suicidal ideas. The patient is nervous/anxious.     Per HPI unless specifically indicated above     Objective:    BP 121/77   Pulse 99   Temp 97.6 F (36.4 C) (Oral)   Ht 5' 4.5 (1.638 m)   Wt 214 lb 12.8 oz (97.4 kg)   LMP 05/09/2014 (Approximate)   SpO2 98%   BMI 36.30 kg/m   Wt Readings from Last 3 Encounters:  02/12/24 214 lb 12.8 oz (97.4 kg)  03/07/23 201 lb 12.8 oz (91.5 kg)  08/16/22 206 lb 1.6 oz (93.5 kg)    Physical Exam Vitals and nursing note reviewed.  Constitutional:      General: She is awake. She is not in acute distress.    Appearance: Normal appearance. She is well-developed and well-groomed. She is obese. She is not ill-appearing or toxic-appearing.  HENT:     Head: Normocephalic.     Right Ear: Hearing and external ear normal.     Left Ear: Hearing and external ear normal.  Eyes:     General: Lids are normal.        Right eye: No discharge.        Left eye: No discharge.     Conjunctiva/sclera: Conjunctivae normal.     Pupils: Pupils are equal, round, and reactive to light.  Neck:     Thyroid: No thyromegaly.     Vascular: No carotid bruit.  Cardiovascular:     Rate and Rhythm: Normal rate and regular rhythm.     Heart sounds: Normal heart sounds. No murmur heard.    No gallop.  Pulmonary:     Effort: Pulmonary effort is normal. No accessory muscle usage or respiratory distress.     Breath sounds: Normal breath sounds.  Abdominal:     General: Bowel sounds are normal. There is no distension.     Palpations: Abdomen is soft.     Tenderness: There is no abdominal tenderness.  Musculoskeletal:     Cervical back: Normal range of motion and neck supple.     Right lower leg: No edema.     Left  lower leg: No edema.  Lymphadenopathy:     Cervical: No cervical adenopathy.  Skin:    General: Skin is warm and dry.  Neurological:     Mental Status: She is alert and oriented to person, place, and time.     Deep Tendon Reflexes: Reflexes are normal and symmetric.     Reflex Scores:      Brachioradialis reflexes are 2+ on the right side and 2+ on the left side.      Patellar reflexes are 2+ on the right side and 2+ on  the left side. Psychiatric:        Attention and Perception: Attention normal.        Mood and Affect: Mood normal.        Speech: Speech normal.        Behavior: Behavior normal. Behavior is cooperative.        Thought Content: Thought content normal.     Results for orders placed or performed in visit on 03/07/23  Bayer DCA Hb A1c Waived   Collection Time: 03/07/23  1:19 PM  Result Value Ref Range   HB A1C (BAYER DCA - WAIVED) 6.2 (H) 4.8 - 5.6 %  Microalbumin, Urine Waived   Collection Time: 03/07/23  1:19 PM  Result Value Ref Range   Microalb, Ur Waived 30 (H) 0 - 19 mg/L   Creatinine, Urine Waived 300 10 - 300 mg/dL   Microalb/Creat Ratio <30 <30 mg/g  Comprehensive metabolic panel   Collection Time: 03/07/23  1:22 PM  Result Value Ref Range   Glucose 120 (H) 70 - 99 mg/dL   BUN 12 8 - 27 mg/dL   Creatinine, Ser 9.08 0.57 - 1.00 mg/dL   eGFR 71 >40 fO/fpw/8.26   BUN/Creatinine Ratio 13 12 - 28   Sodium 143 134 - 144 mmol/L   Potassium 4.0 3.5 - 5.2 mmol/L   Chloride 108 (H) 96 - 106 mmol/L   CO2 19 (L) 20 - 29 mmol/L   Calcium  9.5 8.7 - 10.3 mg/dL   Total Protein 6.3 6.0 - 8.5 g/dL   Albumin 4.4 3.9 - 4.9 g/dL   Globulin, Total 1.9 1.5 - 4.5 g/dL   Bilirubin Total 1.0 0.0 - 1.2 mg/dL   Alkaline Phosphatase 103 44 - 121 IU/L   AST 14 0 - 40 IU/L   ALT 22 0 - 32 IU/L  Lipid Panel w/o Chol/HDL Ratio   Collection Time: 03/07/23  1:22 PM  Result Value Ref Range   Cholesterol, Total 138 100 - 199 mg/dL   Triglycerides 874 0 - 149 mg/dL   HDL 43  >60 mg/dL   VLDL Cholesterol Cal 22 5 - 40 mg/dL   LDL Chol Calc (NIH) 73 0 - 99 mg/dL  TSH   Collection Time: 03/07/23  1:22 PM  Result Value Ref Range   TSH 1.880 0.450 - 4.500 uIU/mL  CBC with Differential/Platelet   Collection Time: 03/07/23  1:22 PM  Result Value Ref Range   WBC 8.0 3.4 - 10.8 x10E3/uL   RBC 4.49 3.77 - 5.28 x10E6/uL   Hemoglobin 13.4 11.1 - 15.9 g/dL   Hematocrit 60.8 65.9 - 46.6 %   MCV 87 79 - 97 fL   MCH 29.8 26.6 - 33.0 pg   MCHC 34.3 31.5 - 35.7 g/dL   RDW 87.6 88.2 - 84.5 %   Platelets 215 150 - 450 x10E3/uL   Neutrophils 56 Not Estab. %   Lymphs 34 Not Estab. %   Monocytes 6 Not Estab. %   Eos 3 Not Estab. %   Basos 1 Not Estab. %   Neutrophils Absolute 4.6 1.4 - 7.0 x10E3/uL   Lymphocytes Absolute 2.7 0.7 - 3.1 x10E3/uL   Monocytes Absolute 0.5 0.1 - 0.9 x10E3/uL   EOS (ABSOLUTE) 0.2 0.0 - 0.4 x10E3/uL   Basophils Absolute 0.1 0.0 - 0.2 x10E3/uL   Immature Granulocytes 0 Not Estab. %   Immature Grans (Abs) 0.0 0.0 - 0.1 x10E3/uL  VITAMIN D  25 Hydroxy (Vit-D Deficiency, Fractures)   Collection Time:  03/07/23  1:22 PM  Result Value Ref Range   Vit D, 25-Hydroxy 32.0 30.0 - 100.0 ng/mL      Assessment & Plan:   Problem List Items Addressed This Visit       Cardiovascular and Mediastinum   Migraine   Chronic, stable.  Will continue Topamax , which offers her benefit.  Controlled with current regimen.       Relevant Medications   atorvastatin  (LIPITOR) 40 MG tablet   losartan  (COZAAR ) 50 MG tablet   topiramate  (TOPAMAX ) 50 MG tablet   Hypertension associated with diabetes (HCC) - Primary   Chronic, ongoing.  BP at goal today with Losartan  and tolerating well.  Recommend she monitor BP at least a few mornings a week at home and document.  DASH diet at home.  Continue current medication regimen and adjust as needed.  Labs today: CMP, lipid.  Return in 6 months.      Relevant Medications   atorvastatin  (LIPITOR) 40 MG tablet   glipiZIDE   (GLUCOTROL ) 5 MG tablet   losartan  (COZAAR ) 50 MG tablet   Other Relevant Orders   Comprehensive metabolic panel with GFR     Endocrine   Type 2 diabetes mellitus with obesity (HCC)   Chronic, stable with A1c remaining at goal today = 6.2% and urine ALB 17 March 2023.  At this time continue Glipizide  due to cost effectiveness and overall benefit to sugar levels.  Recommend she check blood sugar at least weekly and document for provider visits.  Alert provider if any consistent blood sugar <70.  Return in 6 months. - Needs eye exam.  Foot exam up to date. - ARB and statin on board       Relevant Medications   atorvastatin  (LIPITOR) 40 MG tablet   glipiZIDE  (GLUCOTROL ) 5 MG tablet   losartan  (COZAAR ) 50 MG tablet   Other Relevant Orders   Hemoglobin A1c   Microalbumin, Urine Waived   Hyperlipidemia associated with type 2 diabetes mellitus (HCC)   Chronic, ongoing.  Continue current medication regimen and adjust as needed.  Lipid panel today.      Relevant Medications   atorvastatin  (LIPITOR) 40 MG tablet   glipiZIDE  (GLUCOTROL ) 5 MG tablet   losartan  (COZAAR ) 50 MG tablet   Other Relevant Orders   Lipid panel   Diabetes mellitus treated with oral medication (HCC)   Relevant Medications   atorvastatin  (LIPITOR) 40 MG tablet   glipiZIDE  (GLUCOTROL ) 5 MG tablet   losartan  (COZAAR ) 50 MG tablet     Other   Depression, recurrent (HCC)   Chronic, exacerbated by current living situation.  Feels like the Topamax  is helping with her symptoms.           Follow up plan: No follow-ups on file.

## 2024-02-12 NOTE — Assessment & Plan Note (Signed)
 Chronic, ongoing.  Continue current medication regimen and adjust as needed. Lipid panel today.

## 2024-02-13 LAB — COMPREHENSIVE METABOLIC PANEL WITH GFR
ALT: 18 IU/L (ref 0–32)
AST: 13 IU/L (ref 0–40)
Albumin: 4.3 g/dL (ref 3.9–4.9)
Alkaline Phosphatase: 107 IU/L (ref 44–121)
BUN/Creatinine Ratio: 12 (ref 12–28)
BUN: 11 mg/dL (ref 8–27)
Bilirubin Total: 1 mg/dL (ref 0.0–1.2)
CO2: 19 mmol/L — ABNORMAL LOW (ref 20–29)
Calcium: 9.2 mg/dL (ref 8.7–10.3)
Chloride: 107 mmol/L — ABNORMAL HIGH (ref 96–106)
Creatinine, Ser: 0.89 mg/dL (ref 0.57–1.00)
Globulin, Total: 1.8 g/dL (ref 1.5–4.5)
Glucose: 143 mg/dL — ABNORMAL HIGH (ref 70–99)
Potassium: 4 mmol/L (ref 3.5–5.2)
Sodium: 140 mmol/L (ref 134–144)
Total Protein: 6.1 g/dL (ref 6.0–8.5)
eGFR: 72 mL/min/1.73 (ref >59)

## 2024-02-13 LAB — LIPID PANEL
Chol/HDL Ratio: 3.5 ratio (ref 0.0–4.4)
Cholesterol, Total: 145 mg/dL (ref 100–199)
HDL: 42 mg/dL (ref >39)
LDL Chol Calc (NIH): 78 mg/dL (ref 0–99)
Triglycerides: 145 mg/dL (ref 0–149)
VLDL Cholesterol Cal: 25 mg/dL (ref 5–40)

## 2024-02-13 LAB — HEMOGLOBIN A1C
Est. average glucose Bld gHb Est-mCnc: 137 mg/dL
Hgb A1c MFr Bld: 6.4 % — ABNORMAL HIGH (ref 4.8–5.6)

## 2024-02-14 ENCOUNTER — Ambulatory Visit: Payer: Self-pay | Admitting: Nurse Practitioner

## 2024-06-27 ENCOUNTER — Telehealth: Admitting: Physician Assistant

## 2024-06-27 DIAGNOSIS — J111 Influenza due to unidentified influenza virus with other respiratory manifestations: Secondary | ICD-10-CM | POA: Diagnosis not present

## 2024-06-27 MED ORDER — OSELTAMIVIR PHOSPHATE 75 MG PO CAPS: 75.0000 mg | ORAL_CAPSULE | Freq: Two times a day (BID) | ORAL | 0 refills | Status: DC

## 2024-06-27 MED ORDER — ALBUTEROL SULFATE HFA 108 (90 BASE) MCG/ACT IN AERS: 1.0000 | INHALATION_SPRAY | Freq: Four times a day (QID) | RESPIRATORY_TRACT | 0 refills | Status: AC | PRN

## 2024-06-27 MED ORDER — BENZONATATE 100 MG PO CAPS: 100.0000 mg | ORAL_CAPSULE | Freq: Three times a day (TID) | ORAL | 0 refills | Status: AC | PRN

## 2024-06-27 NOTE — Progress Notes (Signed)
 Virtual Visit Consent   Kendra Espinoza, you are scheduled for a virtual visit with a [facility] provider today. Just as with appointments in the office, your consent must be obtained to participate. Your consent will be active for this visit and any virtual visit you may have with one of our providers in the next 365 days. If you have a MyChart account, a copy of this consent can be sent to you electronically.  As this is a virtual visit, video technology does not allow for your provider to perform a traditional examination. This may limit your provider's ability to fully assess your condition. If your provider identifies any concerns that need to be evaluated in person or the need to arrange testing (such as labs, EKG, etc.), we will make arrangements to do so. Although advances in technology are sophisticated, we cannot ensure that it will always work on either your end or our end. If the connection with a video visit is poor, the visit may have to be switched to a telephone visit. With either a video or telephone visit, we are not always able to ensure that we have a secure connection.  By engaging in this virtual visit, you consent to the provision of healthcare and authorize for your insurance to be billed (if applicable) for the services provided during this visit. Depending on your insurance coverage, you may receive a charge related to this service.  I need to obtain your verbal consent now. Are you willing to proceed with your visit today? Kendra Espinoza has provided verbal consent on 06/27/2024 for a virtual visit (video or telephone). Kendra Kendra [name], PA-C  Date: 06/27/2024 12:12 PM   Virtual Visit via Video Note   I, Kendra Espinoza, connected with  Kendra Espinoza  ([id number], November 01, 1960) on 06/27/2024 at 12:00 PM EST by a video-enabled telemedicine application and verified that I am speaking with the correct person using two identifiers.  Location: Patient: Virtual Visit  Location Patient: Home Provider: Virtual Visit Location Provider: Home Office   I discussed the limitations of evaluation and management by telemedicine and the availability of in person appointments. The patient expressed understanding and agreed to proceed.    History of Present Illness: Kendra Espinoza is a 63 y.o. who identifies as a female who was assigned female at birth, and is being seen today for flu-like symptoms.  HPI: URI  This is a new problem. The current episode started in the past 7 days (2 days ago). The problem has been unchanged. The maximum temperature recorded prior to her arrival was 100.4 - 100.9 F. The fever has been present for Less than 1 day. Associated symptoms include congestion, coughing, diarrhea (yesterday), headaches, rhinorrhea (and post nasal drainage), a sore throat and wheezing. Pertinent negatives include no ear pain, nausea, plugged ear sensation, sinus pain, swollen glands or vomiting. Associated symptoms comments: Myalgias, nasal sores from blowing nose, decreased appetite. Treatments tried: steam, humidifier, vicks daytime and nighttime. The treatment provided no relief.  At home Covid testing was negative.  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  Resting comfortably at home.  Head is normocephalic, atraumatic.  No labored breathing.  Speech is clear and coherent with logical content.  Patient is alert and oriented at baseline.  Dry cough heard a few times through the call, not limiting speech  Assessment and Plan: 1. Influenza (Primary) - oseltamivir  (TAMIFLU ) 75 MG capsule; Take 1 capsule (75 mg total) by mouth 2 (two) times daily.  Dispense: 10 capsule; Refill: 0 - benzonatate  (TESSALON ) 100 MG capsule; Take 1-2 capsules (100-200 mg total) by mouth 3 (three) times daily as needed.  Dispense: 30 capsule; Refill: 0 - albuterol  (VENTOLIN  HFA) 108 (90 Base) MCG/ACT inhaler; Inhale 1-2 puffs into the lungs every 6 (six) hours as needed.  Dispense: 8 g; Refill: 0  - Suspected viral URI with negative covid testing - Possible flu as flu A is prevalent in the community at this time - Limited testing availability that would delay appropriate treatment waiting on results - Tamiflu  prescribed for possible flu - Added Tessalon  perles for cough - Added Albuterol  for wheezing and chest tightness - Work note provided - Push fluids - Symptomatic management OTC of choice as needed - Seek in person evaluation if symptoms worsen or fail to improve   Follow Up Instructions: I discussed the assessment and treatment plan with the patient. The patient was provided an opportunity to ask questions and all were answered. The patient agreed with the plan and demonstrated an understanding of the instructions.  A copy of instructions were sent to the patient via MyChart unless otherwise noted below.    The patient was advised to call back or seek an in-person evaluation if the symptoms worsen or if the condition fails to improve as anticipated.    Kendra Jeangilles M Kamoni Depree, PA-C     [1]  Allergies Allergen Reactions   Codeine     Dizziness  [2]  Current Outpatient Medications:    albuterol   (VENTOLIN  HFA) 108 (90 Base) MCG/ACT inhaler, Inhale 1-2 puffs into the lungs every 6 (six) hours as needed., Disp: 8 g, Rfl: 0   benzonatate  (TESSALON ) 100 MG capsule, Take 1-2 capsules (100-200 mg total) by mouth 3 (three) times daily as needed., Disp: 30 capsule, Rfl: 0   oseltamivir  (TAMIFLU ) 75 MG capsule, Take 1 capsule (75 mg total) by mouth 2 (two) times daily., Disp: 10 capsule, Rfl: 0   atorvastatin  (LIPITOR) 40 MG tablet, Take 1 tablet (40 mg total) by mouth daily., Disp: 90 tablet, Rfl: 1   glipiZIDE  (GLUCOTROL ) 5 MG tablet, Take 1 tablet (5 mg total) by mouth 2 (two) times daily before a meal., Disp: 180 tablet, Rfl: 1   losartan  (COZAAR ) 50 MG tablet, Take 1 tablet (50 mg total) by mouth daily., Disp: 90 tablet, Rfl: 1   topiramate  (TOPAMAX ) 50 MG tablet, Take 1 tablet (50 mg total) by mouth 2 (two) times daily., Disp: 180 tablet, Rfl: 1

## 2024-06-27 NOTE — Patient Instructions (Signed)
 Kendra Espinoza, thank you for joining Kendra [name], PA-C for today's virtual visit.  While this provider is not your primary care provider (PCP), if your PCP is located in our provider database this encounter information will be shared with them immediately following your visit.   A [facility] MyChart account gives you access to today's visit and all your visits, tests, and labs performed at [facility]  click here if you don't have a [facility] MyChart account or go to [email or web address]  Consent: (Patient) Kendra Espinoza provided verbal consent for this virtual visit at the beginning of the encounter.  Current Medications:  Current Outpatient Medications:    albuterol  (VENTOLIN  HFA) 108 (90 Base) MCG/ACT inhaler, Inhale 1-2 puffs into the lungs every 6 (six) hours as needed., Disp: 8 g, Rfl: 0   benzonatate  (TESSALON ) 100 MG capsule, Take 1-2 capsules (100-200 mg total) by mouth 3 (three) times daily as needed., Disp: 30 capsule, Rfl: 0   oseltamivir  (TAMIFLU ) 75 MG capsule, Take 1 capsule (75 mg total) by mouth 2 (two) times daily., Disp: 10 capsule, Rfl: 0   atorvastatin  (LIPITOR) 40 MG tablet, Take 1 tablet (40 mg total) by mouth daily., Disp: 90 tablet, Rfl: 1   glipiZIDE  (GLUCOTROL ) 5 MG tablet, Take 1 tablet (5 mg total) by mouth 2 (two) times daily before a meal., Disp: 180 tablet, Rfl: 1   losartan  (COZAAR ) 50 MG tablet, Take 1 tablet (50 mg total) by mouth daily., Disp: 90 tablet, Rfl: 1   topiramate  (TOPAMAX ) 50 MG tablet, Take 1 tablet (50 mg total) by mouth 2 (two) times daily., Disp: 180 tablet, Rfl: 1   Medications ordered in this encounter:  Meds ordered this encounter  Medications   oseltamivir  (TAMIFLU ) 75 MG capsule    Sig: Take 1 capsule (75 mg total) by mouth 2 (two) times daily.    Dispense:  10 capsule    Refill:  0    Supervising Provider:   LAMPTEY, PHILIP O [[id number]]   benzonatate  (TESSALON ) 100 MG capsule    Sig: Take 1-2 capsules  (100-200 mg total) by mouth 3 (three) times daily as needed.    Dispense:  30 capsule    Refill:  0    Supervising Provider:   LAMPTEY, PHILIP O [[id number]]   albuterol  (VENTOLIN  HFA) 108 (90 Base) MCG/ACT inhaler    Sig: Inhale 1-2 puffs into the lungs every 6 (six) hours as needed.    Dispense:  8 g    Refill:  0    Supervising Provider:   BLAISE ALEENE KIDD [[id number]]     *If you need refills on other medications prior to your next appointment, please contact your pharmacy*  Follow-Up: Call back or seek an in-person evaluation if the symptoms worsen or if the condition fails to improve as anticipated.  [facility] [phone]  Other Instructions Influenza, Adult Influenza is also called the flu. It's an infection that affects your respiratory tract. This includes your nose, throat, windpipe, and lungs. The flu is contagious. This means it spreads easily from person to person. It causes symptoms that are like a cold. It can also cause a high fever and body aches. What are the causes? The flu is caused by the influenza virus. You can get it by: Breathing in droplets that are in the air after an infected person coughs or sneezes. Touching something that has the virus on it and then touching your mouth, nose, or eyes.  What increases the risk? You may be more likely to get the flu if: You don't wash your hands often. You're near a lot of people during cold and flu season. You touch your mouth, eyes, or nose without washing your hands first. You don't get a flu shot each year. You may also be more at risk for the flu and serious problems, such as a lung infection called pneumonia, if: You're older than 65. You're pregnant. Your immune system is weak. Your immune system is your body's defense system. You have a long-term, or chronic, condition, such as: Heart, kidney, or lung disease. Diabetes. A liver disorder. Asthma. You're very overweight. You have anemia. This  is when you don't have enough red blood cells in your body. What are the signs or symptoms? Flu symptoms often start all of a sudden. They may last 4-14 days and include: Fever and chills. Headaches, body aches, or muscle aches. Sore throat. Cough. Runny or stuffy nose. Discomfort in your chest. Not wanting to eat as much as normal. Feeling weak or tired. Feeling dizzy. Nausea or vomiting. How is this diagnosed? The flu may be diagnosed based on your symptoms and medical history. You may also have a physical exam. A swab may be taken from your nose or throat and tested for the virus. How is this treated? If the flu is found early, you can be treated with antiviral medicine. This may be given to you by mouth or through an IV. It can help you feel less sick and get better faster. Taking care of yourself at home can also help your symptoms get better. Your health care provider may tell you to: Take over-the-counter medicines. Drink lots of fluids. The flu often goes away on its own. If you have very bad symptoms or problems caused by the flu, you may need to be treated in a hospital. Follow these instructions at home: Activity Rest as needed. Get lots of sleep. Stay home from work or school as told by your provider. Leave home only to go see your provider. Do not leave home for other reasons until you don't have a fever for 24 hours without taking medicine. Eating and drinking Take an oral rehydration solution (ORS). This is a drink that is sold at pharmacies and stores. Drink enough fluid to keep your pee pale yellow. Try to drink small amounts of clear fluids. These include water , ice chips, fruit juice mixed with water , and low-calorie sports drinks. Try to eat bland foods that are easy to digest. These include bananas, applesauce, rice, lean meats, toast, and crackers. Avoid drinks that have a lot of sugar or caffeine in them. These include energy drinks, regular sports drinks, and  soda. Do not drink alcohol. Do not eat spicy or fatty foods. General instructions     Take your medicines only as told by your provider. Use a cool mist humidifier to add moisture to the air in your home. This can make it easier for you to breathe. You should also clean the humidifier every day. To do so: Empty the water . Pour clean water  in. Cover your mouth and nose when you cough or sneeze. Wash your hands with soap and water  often and for at least 20 seconds. It's extra important to do so after you cough or sneeze. If you can't use soap and water , use hand sanitizer. How is this prevented?  Get a flu shot every year. Ask your provider when you should get your flu shot.  Stay away from people who are sick during fall and winter. Fall and winter are cold and flu season. Contact a health care provider if: You get new symptoms. You have chest pain. You have watery poop, also called diarrhea. You have a fever. Your cough gets worse. You start to have more mucus. You feel like you may vomit, or you vomit. Get help right away if: You become short of breath or have trouble breathing. Your skin or nails turn blue. You have very bad pain or stiffness in your neck. You get a sudden headache or pain in your face or ear. You vomit each time you eat or drink. These symptoms may be an emergency. Call 911 right away. Do not wait to see if the symptoms will go away. Do not drive yourself to the hospital. This information is not intended to replace advice given to you by your health care provider. Make sure you discuss any questions you have with your health care provider. Document Revised: 04/06/2023 Document Reviewed: 08/11/2022 Elsevier Patient Education  2024 [employer]   If you have been instructed to have an in-person evaluation today at a local Urgent Care facility, please use the link below. It will take you to a list of all of our available [facility], including  address, phone number and hours of operation. Please do not delay care.  [facility]  If you or a family member do not have a primary care provider, use the link below to schedule a visit and establish care. When you choose a [facility] primary care physician or advanced practice provider, you gain a long-term partner in health. Find a Primary Care Provider  Learn more about [facility]'s in-office and virtual care options: [place] [facility] - Get Care Now

## 2024-07-02 ENCOUNTER — Ambulatory Visit

## 2024-07-02 ENCOUNTER — Ambulatory Visit
Admission: EM | Admit: 2024-07-02 | Discharge: 2024-07-02 | Disposition: A | Discharge status: Home / Self Care | Attending: Emergency Medicine | Admitting: Emergency Medicine

## 2024-07-02 ENCOUNTER — Telehealth: Payer: Self-pay | Admitting: Nurse Practitioner

## 2024-07-02 ENCOUNTER — Encounter: Payer: Self-pay | Admitting: Physician Assistant

## 2024-07-02 DIAGNOSIS — J209 Acute bronchitis, unspecified: Secondary | ICD-10-CM

## 2024-07-02 DIAGNOSIS — J0100 Acute maxillary sinusitis, unspecified: Secondary | ICD-10-CM | POA: Diagnosis not present

## 2024-07-02 MED ORDER — ONDANSETRON 8 MG PO TBDP: ORAL_TABLET | ORAL | 0 refills | Status: AC

## 2024-07-02 MED ORDER — FLUTICASONE PROPIONATE 50 MCG/ACT NA SUSP: 2.0000 | Freq: Every day | NASAL | 0 refills | Status: AC

## 2024-07-02 MED ORDER — ALBUTEROL SULFATE (2.5 MG/3ML) 0.083% IN NEBU
2.5000 mg | INHALATION_SOLUTION | Freq: Once | RESPIRATORY_TRACT | Status: AC
  Administered 2024-07-02: 11:00:00 2.5 mg via RESPIRATORY_TRACT

## 2024-07-02 MED ORDER — IPRATROPIUM-ALBUTEROL 0.5-2.5 (3) MG/3ML IN SOLN
3.0000 mL | Freq: Once | RESPIRATORY_TRACT | Status: AC
  Administered 2024-07-02: 11:00:00 3 mL via RESPIRATORY_TRACT

## 2024-07-02 MED ORDER — HYDROCOD POLI-CHLORPHE POLI ER 10-8 MG/5ML PO SUER: 5.0000 mL | Freq: Two times a day (BID) | ORAL | 0 refills | Status: AC | PRN

## 2024-07-02 MED ORDER — AMOXICILLIN-POT CLAVULANATE 875-125 MG PO TABS: 1.0000 | ORAL_TABLET | Freq: Two times a day (BID) | ORAL | 0 refills | Status: AC

## 2024-07-02 MED ORDER — IBUPROFEN 600 MG PO TABS: 600.0000 mg | ORAL_TABLET | Freq: Three times a day (TID) | ORAL | 0 refills | Status: AC | PRN

## 2024-07-02 MED ORDER — AEROCHAMBER PLUS FLO-VU MEDIUM MISC
1.0000 | Freq: Once | Status: AC
  Administered 2024-07-02: 11:00:00 1

## 2024-07-02 MED ORDER — PREDNISONE 20 MG PO TABS: 40.0000 mg | ORAL_TABLET | Freq: Every day | ORAL | 0 refills | Status: AC

## 2024-07-02 NOTE — Telephone Encounter (Signed)
 Patient will need to contact UC.

## 2024-07-02 NOTE — Telephone Encounter (Unsigned)
 Copied from CRM #[id number]. Topic: Clinical - Prescription Issue >> Jul 02, 2024  1:28 PM [name] wrote: Reason for CRM: Pt is allergic to codeine and was prescribed some today. Also states the benzonatate  pearls she was prescribed last Friday are not working.   Best contact: [phone]

## 2024-07-02 NOTE — Discharge Instructions (Signed)
 Take two puffs from your albuterol  inhaler with your spacer every 4 hours for 2 days, then every 6 hours for 2 days, then as needed. You can back off if you start to improve  sooner. Finish the steroids unless your doctor tells you to stop. Take tylenol  1 gram combined with  600 mg of motrin  up to 3-4 times a day as needed for pain. Make sure you drink extra fluids.   Zofran  for nausea and vomiting.  This will slow down the diarrhea.  Tussionex for cough.  This will help with the chest pain and slow down the diarrhea as well.  Push extra electrolyte containing fluids such as Pedialyte, liquid IV, Gatorade.  Start Mucinex to keep the mucous thin and to decongest you.  . Use a NeilMed sinus rinse with distilled water  as often as you want to to reduce nasal congestion. Follow the directions on the box.  Flonase .  Wait-and-see prescription of Augmentin .  I would wait another 4 days to start this.  Augmentin  can worsen your diarrhea.  Go to www.goodrx.com to look up your medications. This will give you a list of where you can find your prescriptions at the most affordable prices. Or you can ask the pharmacist what the cash price is. This is frequently cheaper than going through insurance.

## 2024-07-02 NOTE — Telephone Encounter (Signed)
 Patient was seen at urgent care earlier today. Can we change the medication or does she need to contact them to change?

## 2024-07-02 NOTE — ED Triage Notes (Signed)
 Pt c/o cough,congestion,diarrhea & fatigue x1 wk. Had E-visit on 12/11 for the same issue. Was treated for flu w/o being tested. Given tamiflu ,albuterol  & tessalon  w/o relief.

## 2024-07-02 NOTE — ED Provider Notes (Incomplete)
 HPI  SUBJECTIVE:  Kendra Espinoza is a 63 y.o. female who presents with 6 days of coughing, wheezing, nasal congestion, yellow-green rhinorrhea, headaches, body aches, watery, nonbloody diarrhea twice a day when she eats and general weakness.  She reports sinus pain and pressure, postnasal drip, chest soreness from coughing, shortness of breath, dyspnea on exertion and nausea.  She is unable to sleep at night because of the cough.  She reports double sickening.  No vomiting, fevers, abdominal pain.  No antipyretic in the past 6 hours.  No antibiotics in the past 3 months.  No recent travel, raw undercooked foods, questionable leftovers, recent antibiotics, exposure to diarrheal illness, exposure to chickens or reptiles. She had an ED visit 5 days ago for fever, cough, congestion, diarrhea sore throat and wheezing.  She was suspected to have a viral URI, and was treated empirically for influenza with Tamiflu , Tessalon , albuterol .  States she finished the Tamiflu  and has also been using a humidifier, DayQuil and NyQuil.  NyQuil and DayQuil helped.  No aggravating factors.  She has a past medical history of GERD, hypertension, migraines, chronic diarrhea, diabetes recent A1c 6.4, hyperlipidemia.  No history of chronic kidney disease, hypokalemia, pulmonary disease, smoking of C. difficile infection.  PCP: [facility]    Past Medical History:  Diagnosis Date   Anxiety    Cancer (HCC)    skin   GERD (gastroesophageal reflux disease)    Hypertension    Migraines    about 1x/month since starting meds   Sleep apnea     Past Surgical History:  Procedure Laterality Date   CESAREAN SECTION     COLONOSCOPY WITH PROPOFOL  N/A 06/11/2019   Procedure: COLONOSCOPY WITH BIOPSIES;  Surgeon: Janalyn Keene NOVAK, MD;  Location: [facility];  Service: Endoscopy;  Laterality: N/A;   ESOPHAGOGASTRODUODENOSCOPY (EGD) WITH PROPOFOL  N/A 11/22/2019   Procedure: ESOPHAGOGASTRODUODENOSCOPY (EGD)  WITH PROPOFOL ;  Surgeon: Janalyn Keene NOVAK, MD;  Location: [facility];  Service: Endoscopy;  Laterality: N/A;   POLYPECTOMY N/A 06/11/2019   Procedure: POLYPECTOMY;  Surgeon: Janalyn Keene NOVAK, MD;  Location: [facility];  Service: Endoscopy;  Laterality: N/A;   skin cancer removal     TONSILLECTOMY     TUBAL LIGATION  1985    Family History  Problem Relation Age of Onset   Heart disease Mother    Hypertension Mother    Stroke Mother    Diabetes Mother    Miscarriages / Stillbirths Mother    Heart attack Father 53   Heart disease Father    Hypertension Father    Drug abuse Sister    Hodgkin's lymphoma Brother    Cancer Brother    Heart attack Maternal Grandmother    Heart attack Maternal Grandfather    Heart attack Paternal Grandfather     Social History[1]  Current Medications[2]  Allergies[3]   ROS  As noted in HPI.   Physical Exam  BP (!) 131/96 (BP Location: Left Arm)   Pulse 85   Temp 97.8 F (36.6 C) (Oral)   Resp 16   Wt 93.4 kg   LMP 05/09/2014   SpO2 96%   BMI 34.78 kg/m   Constitutional: Well developed, well nourished, coughing Eyes: PERRL, EOMI, conjunctiva normal bilaterally HENT: Normocephalic, atraumatic,mucus membranes moist.  Purulent nasal congestion.  Erythematous, swollen turbinates.  Positive exquisite maxillary sinus tenderness.  No frontal sinus tenderness.  Normal oropharynx.  Positive postnasal drip. Neck: Positive cervical lymphadenopathy Respiratory: Prolonged expiratory phase,  diffuse expiratory wheezing throughout all lung fields.  Positive anterior, lateral chest wall tenderness Cardiovascular: Normal rate and rhythm, no murmurs, no gallops, no rubs GI: Soft, nondistended, normal bowel sounds, nontender, no rebound, no guarding Back: no CVAT skin: No rash, skin intact Musculoskeletal: no deformities Neurologic: Alert & oriented x 3, CN III-XII grossly intact, no motor deficits, sensation grossly  intact Psychiatric: Speech and behavior appropriate   ED Course   Medications  albuterol  (PROVENTIL ) (2.5 MG/3ML) 0.083% nebulizer solution 2.5 mg (2.5 mg Nebulization Given 07/02/24 1126)  ipratropium-albuterol  (DUONEB) 0.5-2.5 (3) MG/3ML nebulizer solution 3 mL (3 mLs Nebulization Given 07/02/24 1126)  AeroChamber Plus Flo-Vu Medium MISC 1 each (1 each Other Given 07/02/24 1127)    Orders Placed This Encounter  Procedures   DG Chest 2 View    Standing Status:   Standing    Number of Occurrences:   1    Reason for Exam (SYMPTOM  OR DIAGNOSIS REQUIRED):   cough & chest congestion x1 wk.   No results found for this or any previous visit (from the past 24 hours). DG Chest 2 View Result Date: 07/02/2024 CLINICAL DATA:  Cough EXAM: CHEST - 2 VIEW COMPARISON:  June 14, 2021 FINDINGS: The heart size and mediastinal contours are within normal limits. Both lungs are clear. The visualized skeletal structures are unremarkable. IMPRESSION: No active cardiopulmonary disease. Electronically Signed   By: Lynwood Landy Raddle M.D.   On: 07/02/2024 10:24    ED Clinical Impression  1. Acute non-recurrent maxillary sinusitis   2. Acute bronchitis, unspecified organism      ED Assessment/Plan    Outside records reviewed.  As noted in HPI  [place] Narcotic database reviewed for this patient, and feel that the risk/benefit ratio today is favorable for proceeding with a prescription for controlled substance.  No opiate prescriptions in the past 2 years  Reviewed imaging independently.  No acute cardiopulmonary disease. See radiology report for full details.  X-ray negative for pneumonia.  Suspect a bronchitis/reactive airways and maxillary sinusitis.  I considered infectious diarrhea and considered getting CBC, BMP and stool studies given duration of symptoms, but given that she has no known exposures for her specifically infectious diarrhea, and is happening twice a day, I have decided to forego  that for now.  She states that her glucose has been well-controlled, she has no history of hypokalemia or other electrolyte imbalance.  I suspect it is part of the process that she has now.  Her primary issue is coughing, wheezing, shortness of breath.  She has mild wheezing throughout all lung fields and poor to fair air movement.  Will give DuoNeb 5/0.5 mg and reevaluate.  Will give her spacer here and show her how to use it for her inhaler.  X-ray is negative for pneumonia.  Discussed this with patient while in department.  Will send home with regular scheduled albuterol  for 4 days, then as needed thereafter, prednisone  40 mg for 5 days, Tussionex for cough-with the intention that the hydrocodone will help with chest pain and slow down the diarrhea.  Discussed with patient this has hydrocodone in it which is a narcotic.  On reevaluation, she states she feels significantly better.  Improved air movement.  States chest is not as tight.  Still mild expiratory wheezing.  She has exquisite maxillary sinus tenderness and purulent nasal congestion.  She reports double sickening.  Will send home with a wait-and-see prescription of Augmentin  for a sinusitis.  Discussed with her  that this could worsen her diarrhea, so she will wait another 4 days to start it.  Discussed  imaging, MDM, treatment plan, and plan for follow-up with patient  patient agrees with plan.   Noted phone call in the PCP saying that she would prefer a different cough syrup.  Will prescribe Promethazine  DM to pharmacy on record I will have staff reach out to her and let her know of prescription waiting for her at the pharmacy.  Meds ordered this encounter  Medications   albuterol  (PROVENTIL ) (2.5 MG/3ML) 0.083% nebulizer solution 2.5 mg   ipratropium-albuterol  (DUONEB) 0.5-2.5 (3) MG/3ML nebulizer solution 3 mL   AeroChamber Plus Flo-Vu Medium MISC 1 each   fluticasone  (FLONASE ) 50 MCG/ACT nasal spray    Sig: Place 2 sprays into both  nostrils daily.    Dispense:  16 g    Refill:  0   ibuprofen  (ADVIL ) 600 MG tablet    Sig: Take 1 tablet (600 mg total) by mouth every 8 (eight) hours as needed.    Dispense:  30 tablet    Refill:  0   amoxicillin -clavulanate (AUGMENTIN ) 875-125 MG tablet    Sig: Take 1 tablet by mouth every 12 (twelve) hours.    Dispense:  14 tablet    Refill:  0   ondansetron  (ZOFRAN -ODT) 8 MG disintegrating tablet    Sig: 1/2- 1 tablet q 8 hr prn nausea, vomiting    Dispense:  20 tablet    Refill:  0   chlorpheniramine-HYDROcodone (TUSSIONEX) 10-8 MG/5ML    Sig: Take 5 mLs by mouth every 12 (twelve) hours as needed for cough.    Dispense:  60 mL    Refill:  0   predniSONE  (DELTASONE ) 20 MG tablet    Sig: Take 2 tablets (40 mg total) by mouth daily with breakfast for 5 days.    Dispense:  10 tablet    Refill:  0      *This clinic note was created using Scientist, clinical (histocompatibility and immunogenetics). Therefore, there may be occasional mistakes despite careful proofreading. ?     Van Knee, MD 07/03/24 1009    Van Knee, MD 07/03/24 1011     [1]  Social History Tobacco Use   Smoking status: Never   Smokeless tobacco: Never  Vaping Use   Vaping status: Never Used  Substance Use Topics   Alcohol use: Not Currently    Comment: occasionally   Drug use: No  [2] No current facility-administered medications for this encounter.  Current Outpatient Medications:    amoxicillin -clavulanate (AUGMENTIN ) 875-125 MG tablet, Take 1 tablet by mouth every 12 (twelve) hours., Disp: 14 tablet, Rfl: 0   chlorpheniramine-HYDROcodone (TUSSIONEX) 10-8 MG/5ML, Take 5 mLs by mouth every 12 (twelve) hours as needed for cough., Disp: 60 mL, Rfl: 0   fluticasone  (FLONASE ) 50 MCG/ACT nasal spray, Place 2 sprays into both nostrils daily., Disp: 16 g, Rfl: 0   ibuprofen  (ADVIL ) 600 MG tablet, Take 1 tablet (600 mg total) by mouth every 8 (eight) hours as needed., Disp: 30 tablet, Rfl: 0   ondansetron   (ZOFRAN -ODT) 8 MG disintegrating tablet, 1/2- 1 tablet q 8 hr prn nausea, vomiting, Disp: 20 tablet, Rfl: 0   predniSONE  (DELTASONE ) 20 MG tablet, Take 2 tablets (40 mg total) by mouth daily with breakfast for 5 days., Disp: 10 tablet, Rfl: 0   albuterol  (VENTOLIN  HFA) 108 (90 Base) MCG/ACT inhaler, Inhale 1-2 puffs into the lungs every 6 (six) hours as needed., Disp: 8 g, Rfl:  0   atorvastatin  (LIPITOR) 40 MG tablet, Take 1 tablet (40 mg total) by mouth daily., Disp: 90 tablet, Rfl: 1   benzonatate  (TESSALON ) 100 MG capsule, Take 1-2 capsules (100-200 mg total) by mouth 3 (three) times daily as needed., Disp: 30 capsule, Rfl: 0   glipiZIDE  (GLUCOTROL ) 5 MG tablet, Take 1 tablet (5 mg total) by mouth 2 (two) times daily before a meal., Disp: 180 tablet, Rfl: 1   losartan  (COZAAR ) 50 MG tablet, Take 1 tablet (50 mg total) by mouth daily., Disp: 90 tablet, Rfl: 1   topiramate  (TOPAMAX ) 50 MG tablet, Take 1 tablet (50 mg total) by mouth 2 (two) times daily., Disp: 180 tablet, Rfl: 1 [3]  Allergies Allergen Reactions   Codeine     Dizziness     Ronnita Paz, MD 07/03/24 1029

## 2024-07-03 MED ORDER — PROMETHAZINE-DM 6.25-15 MG/5ML PO SYRP: 5.0000 mL | ORAL_SOLUTION | Freq: Four times a day (QID) | ORAL | 0 refills | Status: AC | PRN

## 2024-07-03 NOTE — Telephone Encounter (Signed)
 Attempted to reach patient x1 to notify. LVM.

## 2024-07-03 NOTE — Telephone Encounter (Signed)
 Patient requesting different cough syrup. will prescribe Promethazine  DM 120 mL 5 mL every 6 hours as needed cough to pharmacy on record.  Will have staff reach out to patient and notify her of prescription waiting for the pharmacy.

## 2024-07-29 ENCOUNTER — Other Ambulatory Visit: Payer: Self-pay | Admitting: Nurse Practitioner

## 2024-07-29 DIAGNOSIS — E1159 Type 2 diabetes mellitus with other circulatory complications: Secondary | ICD-10-CM

## 2024-08-02 ENCOUNTER — Other Ambulatory Visit: Payer: Self-pay | Admitting: Nurse Practitioner

## 2024-08-02 DIAGNOSIS — E1159 Type 2 diabetes mellitus with other circulatory complications: Secondary | ICD-10-CM

## 2024-08-02 NOTE — Telephone Encounter (Signed)
 Patient is overdue for follow up appointment. Please call to schedule and then route to provider for refill.

## 2024-08-02 NOTE — Telephone Encounter (Signed)
 Requested medications are due for refill today.  yes  Requested medications are on the active medications list.  yes  Last refill. 02/12/2024 #90 1 rf  Future visit scheduled.   no  Notes to clinic.  Pt has not received a courtesy refill. Please advise.      Requested Prescriptions  Pending Prescriptions Disp Refills   losartan  (COZAAR ) 50 MG tablet [Pharmacy Med Name: Losartan  Potassium 50 MG Oral Tablet] 90 tablet 0    Sig: Take 1 tablet by mouth once daily     Cardiovascular:  Angiotensin Receptor Blockers Failed - 08/02/2024  1:19 PM      Failed - Last BP in normal range    BP Readings from Last 1 Encounters:  07/02/24 (!) 131/96         Passed - Cr in normal range and within 180 days    Creatinine, Ser  Date Value Ref Range Status  02/12/2024 0.89 0.57 - 1.00 mg/dL Final         Passed - K in normal range and within 180 days    Potassium  Date Value Ref Range Status  02/12/2024 4.0 3.5 - 5.2 mmol/L Final         Passed - Patient is not pregnant      Passed - Valid encounter within last 6 months    Recent Outpatient Visits           5 months ago Hypertension associated with diabetes [facility])   [facility] Melvin Pao, NP

## 2024-08-02 NOTE — Telephone Encounter (Signed)
 Spoke to patient and she will reach out to schedule once her vehicle is out of  the shop
